# Patient Record
Sex: Female | Born: 1960 | Race: White | Hispanic: No
Health system: Southern US, Academic
[De-identification: ages and names within clinical notes are randomized; demographics above are authoritative.]

## PROBLEM LIST (undated history)

## (undated) ENCOUNTER — Encounter

## (undated) ENCOUNTER — Encounter: Attending: Infectious Disease | Primary: Infectious Disease

## (undated) ENCOUNTER — Encounter: Attending: Hematology & Oncology | Primary: Hematology & Oncology

## (undated) ENCOUNTER — Telehealth

## (undated) ENCOUNTER — Telehealth
Attending: Student in an Organized Health Care Education/Training Program | Primary: Student in an Organized Health Care Education/Training Program

## (undated) ENCOUNTER — Ambulatory Visit

## (undated) ENCOUNTER — Other Ambulatory Visit

## (undated) ENCOUNTER — Encounter
Payer: PRIVATE HEALTH INSURANCE | Attending: Student in an Organized Health Care Education/Training Program | Primary: Student in an Organized Health Care Education/Training Program

## (undated) ENCOUNTER — Encounter: Attending: Pulmonary Disease | Primary: Pulmonary Disease

## (undated) ENCOUNTER — Ambulatory Visit: Payer: Medicare (Managed Care)

## (undated) ENCOUNTER — Encounter
Attending: Student in an Organized Health Care Education/Training Program | Primary: Student in an Organized Health Care Education/Training Program

## (undated) ENCOUNTER — Ambulatory Visit: Payer: PRIVATE HEALTH INSURANCE

## (undated) ENCOUNTER — Telehealth: Attending: Adult Health | Primary: Adult Health

## (undated) ENCOUNTER — Telehealth: Attending: Gynecologic Oncology | Primary: Gynecologic Oncology

## (undated) ENCOUNTER — Telehealth: Attending: Infectious Disease | Primary: Infectious Disease

## (undated) ENCOUNTER — Ambulatory Visit: Attending: Clinical | Primary: Clinical

## (undated) ENCOUNTER — Ambulatory Visit: Attending: Family Medicine | Primary: Family Medicine

## (undated) ENCOUNTER — Ambulatory Visit: Payer: PRIVATE HEALTH INSURANCE | Attending: Hematology & Oncology | Primary: Hematology & Oncology

## (undated) ENCOUNTER — Telehealth: Attending: Nurse Practitioner | Primary: Nurse Practitioner

## (undated) ENCOUNTER — Encounter: Attending: Family Medicine | Primary: Family Medicine

## (undated) ENCOUNTER — Ambulatory Visit: Payer: PRIVATE HEALTH INSURANCE | Attending: Family Medicine | Primary: Family Medicine

## (undated) ENCOUNTER — Ambulatory Visit: Payer: MEDICARE

## (undated) ENCOUNTER — Ambulatory Visit: Payer: Medicare (Managed Care) | Attending: Gynecologic Oncology | Primary: Gynecologic Oncology

## (undated) ENCOUNTER — Encounter: Attending: Gastroenterology | Primary: Gastroenterology

## (undated) ENCOUNTER — Encounter
Payer: PRIVATE HEALTH INSURANCE | Attending: Rehabilitative and Restorative Service Providers" | Primary: Rehabilitative and Restorative Service Providers"

## (undated) ENCOUNTER — Ambulatory Visit: Payer: Medicare (Managed Care) | Attending: Pulmonary Disease | Primary: Pulmonary Disease

## (undated) ENCOUNTER — Ambulatory Visit: Payer: PRIVATE HEALTH INSURANCE | Attending: "Endocrinology | Primary: "Endocrinology

## (undated) ENCOUNTER — Telehealth: Attending: Pharmacist | Primary: Pharmacist

## (undated) ENCOUNTER — Encounter: Payer: PRIVATE HEALTH INSURANCE | Attending: Hematology & Oncology | Primary: Hematology & Oncology

## (undated) ENCOUNTER — Encounter: Attending: Clinical | Primary: Clinical

## (undated) ENCOUNTER — Inpatient Hospital Stay: Payer: Medicare (Managed Care)

## (undated) ENCOUNTER — Ambulatory Visit: Attending: Hematology & Oncology | Primary: Hematology & Oncology

## (undated) ENCOUNTER — Ambulatory Visit
Payer: PRIVATE HEALTH INSURANCE | Attending: Rehabilitative and Restorative Service Providers" | Primary: Rehabilitative and Restorative Service Providers"

## (undated) ENCOUNTER — Encounter: Attending: Family | Primary: Family

## (undated) MED ORDER — URSODIOL 300 MG CAPSULE: 0 days

---
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
## 2004-11-01 ENCOUNTER — Observation Stay: Payer: Self-pay | Admitting: Surgery

## 2004-12-13 ENCOUNTER — Ambulatory Visit: Payer: Self-pay | Admitting: Surgery

## 2009-07-23 ENCOUNTER — Emergency Department: Payer: Self-pay | Admitting: Emergency Medicine

## 2010-06-04 ENCOUNTER — Ambulatory Visit: Payer: Self-pay | Admitting: Internal Medicine

## 2010-06-27 ENCOUNTER — Inpatient Hospital Stay: Payer: Self-pay | Admitting: Internal Medicine

## 2010-07-05 ENCOUNTER — Ambulatory Visit: Payer: Self-pay | Admitting: Internal Medicine

## 2010-07-06 ENCOUNTER — Ambulatory Visit: Payer: Self-pay | Admitting: Internal Medicine

## 2010-07-08 ENCOUNTER — Inpatient Hospital Stay: Payer: Self-pay | Admitting: Internal Medicine

## 2010-08-04 ENCOUNTER — Ambulatory Visit: Payer: Self-pay | Admitting: Internal Medicine

## 2010-09-04 ENCOUNTER — Ambulatory Visit: Payer: Self-pay | Admitting: Internal Medicine

## 2010-10-03 ENCOUNTER — Ambulatory Visit: Payer: Self-pay | Admitting: Internal Medicine

## 2010-10-04 ENCOUNTER — Ambulatory Visit: Payer: Self-pay | Admitting: Internal Medicine

## 2010-11-04 ENCOUNTER — Ambulatory Visit: Payer: Self-pay | Admitting: Internal Medicine

## 2010-12-05 ENCOUNTER — Ambulatory Visit: Payer: Self-pay | Admitting: Internal Medicine

## 2011-01-03 ENCOUNTER — Ambulatory Visit: Payer: Self-pay | Admitting: Internal Medicine

## 2011-01-24 ENCOUNTER — Inpatient Hospital Stay: Payer: Self-pay | Admitting: Internal Medicine

## 2011-02-03 ENCOUNTER — Ambulatory Visit: Payer: Self-pay | Admitting: Internal Medicine

## 2011-03-05 ENCOUNTER — Ambulatory Visit: Payer: Self-pay | Admitting: Internal Medicine

## 2011-04-04 ENCOUNTER — Emergency Department: Payer: Self-pay | Admitting: Emergency Medicine

## 2011-04-05 ENCOUNTER — Ambulatory Visit: Payer: Self-pay | Admitting: Internal Medicine

## 2011-06-05 ENCOUNTER — Ambulatory Visit: Payer: Self-pay | Admitting: Internal Medicine

## 2011-07-06 ENCOUNTER — Ambulatory Visit: Payer: Self-pay | Admitting: Internal Medicine

## 2014-11-15 ENCOUNTER — Emergency Department: Payer: Self-pay | Admitting: Emergency Medicine
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
## 2017-05-22 ENCOUNTER — Ambulatory Visit: Admission: RE | Admit: 2017-05-22 | Discharge: 2017-05-22 | Attending: Pharmacist

## 2017-05-22 DIAGNOSIS — J449 Chronic obstructive pulmonary disease, unspecified: Principal | ICD-10-CM

## 2017-05-22 MED ORDER — TIOTROPIUM BROMIDE 18 MCG CAPSULE WITH INHALATION DEVICE
ORAL_CAPSULE | Freq: Every day | RESPIRATORY_TRACT | 1 refills | 0 days | Status: CP
Start: 2017-05-22 — End: 2017-10-20

## 2017-05-22 MED FILL — PEG-3350/ELECTROLYTES/ELECTROL/SOLR: PEG-3350/ELECTROLYTES/ELECTROL/SOLR | 1 days supply | Qty: 1 | Fill #0

## 2017-05-22 MED FILL — SPIRIVA 18MCG/HANDIHLR/CAP: SPIRIVA 18MCG/HANDIHLR/CAP | 30 days supply | Qty: 1 | Fill #0

## 2017-05-29 ENCOUNTER — Ambulatory Visit
Admission: RE | Admit: 2017-05-29 | Discharge: 2017-05-29 | Disposition: A | Discharge status: Home / Self Care | Attending: Internal Medicine

## 2017-05-29 ENCOUNTER — Ambulatory Visit
Admission: RE | Admit: 2017-05-29 | Discharge: 2017-05-29 | Disposition: A | Discharge status: Home / Self Care | Attending: Certified Registered" | Admitting: Certified Registered"

## 2017-05-29 DIAGNOSIS — K7460 Unspecified cirrhosis of liver: Principal | ICD-10-CM

## 2017-06-20 MED ORDER — URSODIOL 300 MG CAPSULE
ORAL_CAPSULE | Freq: Two times a day (BID) | ORAL | 4 refills | 0 days | Status: CP
Start: 2017-06-20 — End: 2018-09-09

## 2017-06-21 ENCOUNTER — Emergency Department
Admission: EM | Admit: 2017-06-21 | Discharge: 2017-06-21 | Disposition: A | Discharge status: Home / Self Care | Source: Intra-hospital

## 2017-06-21 ENCOUNTER — Emergency Department
Admission: EM | Admit: 2017-06-21 | Discharge: 2017-06-21 | Disposition: A | Discharge status: Home / Self Care | Source: Intra-hospital | Attending: Family | Admitting: Family

## 2017-06-21 DIAGNOSIS — M25562 Pain in left knee: Principal | ICD-10-CM

## 2017-06-21 MED ORDER — ALBUTEROL SULFATE HFA 90 MCG/ACTUATION AEROSOL INHALER: 2 | g | 99 refills | 0 days

## 2017-06-21 MED ORDER — IBUPROFEN 800 MG TABLET
ORAL_TABLET | Freq: Three times a day (TID) | ORAL | 0 refills | 0 days | Status: SS | PRN
Start: 2017-06-21 — End: 2018-09-06

## 2017-06-21 MED ORDER — FLUTICASONE 250 MCG-SALMETEROL 50 MCG/DOSE BLISTR POWDR FOR INHALATION
RESPIRATORY_TRACT | 99 refills | 0 days
Start: 2017-06-21 — End: 2017-06-21

## 2017-06-21 MED ORDER — FUROSEMIDE 40 MG TABLET: 40 mg | tablet | Freq: Every day | 11 refills | 0 days | Status: AC

## 2017-06-21 MED ORDER — PROVENTIL HFA 90 MCG/ACTUATION AEROSOL INHALER
PRN refills | 0 days | Status: CP
Start: 2017-06-21 — End: 2017-06-21

## 2017-06-21 MED ORDER — ALBUTEROL SULFATE HFA 90 MCG/ACTUATION AEROSOL INHALER
RESPIRATORY_TRACT | 99 refills | 0.00000 days
Start: 2017-06-21 — End: 2017-06-21

## 2017-06-21 MED ORDER — SPIRONOLACTONE 100 MG TABLET: 100 mg | tablet | Freq: Every day | 11 refills | 0 days | Status: AC

## 2017-06-21 MED ORDER — FUROSEMIDE 40 MG TABLET
ORAL_TABLET | Freq: Every day | ORAL | 11 refills | 0 days | Status: CP
Start: 2017-06-21 — End: 2017-06-21

## 2017-06-21 MED ORDER — SPIRONOLACTONE 100 MG TABLET
ORAL_TABLET | Freq: Every day | ORAL | 3 refills | 0.00000 days | Status: CP
Start: 2017-06-21 — End: 2018-06-23

## 2017-06-21 MED ORDER — ADVAIR DISKUS 250 MCG-50 MCG/DOSE POWDER FOR INHALATION
PRN refills | 0 days | Status: CP
Start: 2017-06-21 — End: 2017-12-31

## 2017-06-24 MED FILL — SPIRONOLACTONE/100MG/TABS: SPIRONOLACTONE/100MG/TABS | 90 days supply | Qty: 90 | Fill #0

## 2017-06-24 MED FILL — ADVAIR DISKUS/250/50/INH: ADVAIR DISKUS/250/50/INH | 90 days supply | Qty: 3 | Fill #0

## 2017-06-24 MED FILL — PROVENTIL HFA//AERS: PROVENTIL HFA//AERS | 50 days supply | Qty: 2 | Fill #0

## 2017-06-24 MED FILL — URSODIOL/300MG/CAPS: URSODIOL/300MG/CAPS | 90 days supply | Qty: 180 | Fill #0

## 2017-06-24 MED FILL — FUROSEMIDE/40MG/TAB: FUROSEMIDE/40MG/TAB | 90 days supply | Qty: 90 | Fill #0

## 2017-06-24 MED FILL — SPIRIVA 18MCG/HANDIHLR/CAP: SPIRIVA 18MCG/HANDIHLR/CAP | 90 days supply | Qty: 3 | Fill #0

## 2017-06-30 ENCOUNTER — Other Ambulatory Visit: Payer: Self-pay | Admitting: Pediatrics

## 2017-06-30 DIAGNOSIS — J449 Chronic obstructive pulmonary disease, unspecified: Secondary | ICD-10-CM

## 2017-07-04 ENCOUNTER — Emergency Department
Admission: EM | Admit: 2017-07-04 | Discharge: 2017-07-04 | Disposition: A | Discharge status: Home / Self Care | Source: Intra-hospital

## 2017-07-04 MED ORDER — CEPHALEXIN 500 MG CAPSULE
ORAL_CAPSULE | Freq: Four times a day (QID) | ORAL | 0 refills | 0.00000 days | Status: CP
Start: 2017-07-04 — End: 2018-07-04

## 2017-07-04 MED ORDER — CEPHALEXIN 500 MG CAPSULE: 500 mg | capsule | Freq: Four times a day (QID) | 0 refills | 0 days | Status: AC

## 2017-07-04 MED FILL — CEPHALEXIN/500MG/CAPS: CEPHALEXIN/500MG/CAPS | 5 days supply | Qty: 20 | Fill #0

## 2017-07-16 ENCOUNTER — Emergency Department
Admission: EM | Admit: 2017-07-16 | Discharge: 2017-07-16 | Disposition: A | Discharge status: Home / Self Care | Source: Intra-hospital | Attending: Registered Nurse | Admitting: Registered Nurse

## 2017-07-17 ENCOUNTER — Ambulatory Visit: Payer: Disability Insurance | Attending: Pediatrics

## 2017-07-17 DIAGNOSIS — J449 Chronic obstructive pulmonary disease, unspecified: Secondary | ICD-10-CM

## 2017-07-17 MED ORDER — ALBUTEROL SULFATE (2.5 MG/3ML) 0.083% IN NEBU
2.5000 mg | INHALATION_SOLUTION | Freq: Once | RESPIRATORY_TRACT | Status: AC
  Administered 2017-07-17: 2.5 mg via RESPIRATORY_TRACT
  Filled 2017-07-17: qty 3

## 2017-07-23 ENCOUNTER — Ambulatory Visit: Admission: RE | Admit: 2017-07-23 | Discharge: 2017-07-23

## 2017-07-23 DIAGNOSIS — L0292 Furuncle, unspecified: Secondary | ICD-10-CM

## 2017-07-23 DIAGNOSIS — L821 Other seborrheic keratosis: Secondary | ICD-10-CM

## 2017-07-23 DIAGNOSIS — B079 Viral wart, unspecified: Principal | ICD-10-CM

## 2017-07-23 DIAGNOSIS — D485 Neoplasm of uncertain behavior of skin: Secondary | ICD-10-CM

## 2017-07-23 DIAGNOSIS — L720 Epidermal cyst: Secondary | ICD-10-CM

## 2017-07-25 ENCOUNTER — Emergency Department
Admission: EM | Admit: 2017-07-25 | Discharge: 2017-07-26 | Disposition: A | Discharge status: Home / Self Care | Source: Intra-hospital

## 2017-07-26 DIAGNOSIS — R109 Unspecified abdominal pain: Principal | ICD-10-CM

## 2017-08-10 ENCOUNTER — Emergency Department
Admission: EM | Admit: 2017-08-10 | Discharge: 2017-08-10 | Disposition: A | Discharge status: Home / Self Care | Source: Intra-hospital

## 2017-08-10 ENCOUNTER — Emergency Department
Admission: EM | Admit: 2017-08-10 | Discharge: 2017-08-10 | Disposition: A | Discharge status: Home / Self Care | Source: Intra-hospital | Attending: Family | Admitting: Family

## 2017-08-10 DIAGNOSIS — J00 Acute nasopharyngitis [common cold]: Principal | ICD-10-CM

## 2017-08-18 ENCOUNTER — Ambulatory Visit: Admission: RE | Admit: 2017-08-18 | Discharge: 2017-08-18 | Attending: Dermatology | Admitting: Dermatology

## 2017-08-18 DIAGNOSIS — L720 Epidermal cyst: Principal | ICD-10-CM

## 2017-10-20 MED ORDER — TIOTROPIUM BROMIDE 18 MCG CAPSULE WITH INHALATION DEVICE
ORAL_CAPSULE | 1 refills | 0 days
Start: 2017-10-20 — End: 2017-10-20

## 2017-10-20 MED ORDER — SPIRIVA WITH HANDIHALER 18 MCG AND INHALATION CAPSULES
1 refills | 0 days | Status: CP
Start: 2017-10-20 — End: 2018-02-06

## 2017-10-22 MED FILL — FUROSEMIDE/40MG/TAB: FUROSEMIDE/40MG/TAB | 90 days supply | Qty: 90 | Fill #1

## 2017-10-22 MED FILL — SPIRONOLACTONE/100MG/TABS: SPIRONOLACTONE/100MG/TABS | 90 days supply | Qty: 90 | Fill #1

## 2017-10-22 MED FILL — SPIRIVA 18MCG/HANDIHLR/CAP: SPIRIVA 18MCG/HANDIHLR/CAP | 60 days supply | Qty: 2 | Fill #1

## 2017-10-22 MED FILL — PROVENTIL HFA//AERS: PROVENTIL HFA//AERS | 50 days supply | Qty: 2 | Fill #1

## 2017-10-22 MED FILL — URSODIOL/300MG/CAPS: URSODIOL/300MG/CAPS | 90 days supply | Qty: 180 | Fill #1

## 2017-10-22 MED FILL — ADVAIR DISKUS/250/50/INH: ADVAIR DISKUS/250/50/INH | 90 days supply | Qty: 3 | Fill #1

## 2017-11-06 ENCOUNTER — Ambulatory Visit: Admit: 2017-11-06 | Discharge: 2017-11-07

## 2017-11-06 DIAGNOSIS — F329 Major depressive disorder, single episode, unspecified: Secondary | ICD-10-CM

## 2017-11-06 DIAGNOSIS — R7989 Other specified abnormal findings of blood chemistry: Principal | ICD-10-CM

## 2017-11-06 DIAGNOSIS — G2581 Restless legs syndrome: Secondary | ICD-10-CM

## 2017-11-06 DIAGNOSIS — J449 Chronic obstructive pulmonary disease, unspecified: Secondary | ICD-10-CM

## 2017-11-06 MED ORDER — MIRTAZAPINE 15 MG TABLET
ORAL_TABLET | Freq: Every evening | ORAL | 2 refills | 0.00000 days | Status: CP
Start: 2017-11-06 — End: 2017-12-04

## 2017-11-06 MED ORDER — FERROUS SULFATE 325 MG (65 MG IRON) TABLET
ORAL_TABLET | Freq: Every day | ORAL | 3 refills | 0.00000 days | Status: CP
Start: 2017-11-06 — End: 2017-12-04

## 2017-11-06 MED ORDER — FERROUS SULFATE 325 MG (65 MG IRON) TABLET: 325 mg | tablet | Freq: Every day | 3 refills | 0 days | Status: AC

## 2017-11-06 MED ORDER — PREDNISONE 20 MG TABLET
ORAL_TABLET | Freq: Every day | ORAL | 0 refills | 0.00000 days | Status: CP
Start: 2017-11-06 — End: 2018-07-02

## 2017-11-06 MED ORDER — MIRTAZAPINE 15 MG TABLET: 15 mg | tablet | Freq: Every evening | 2 refills | 0 days | Status: AC

## 2017-11-06 MED ORDER — PREDNISONE 20 MG TABLET: 40 mg | tablet | 0 refills | 0 days

## 2017-11-06 MED FILL — FERROUS SULFATE/325MG/TABS: FERROUS SULFATE/325MG/TABS | 30 days supply | Qty: 30 | Fill #0

## 2017-11-06 MED FILL — PREDNISONE/20MG/TABS: PREDNISONE/20MG/TABS | 5 days supply | Qty: 10 | Fill #0

## 2017-11-06 MED FILL — MIRTAZAPINE/15MG/TABS: MIRTAZAPINE/15MG/TABS | 30 days supply | Qty: 30 | Fill #0

## 2017-11-18 ENCOUNTER — Encounter
Admit: 2017-11-18 | Discharge: 2017-11-18 | Disposition: A | Discharge status: Home / Self Care | Payer: PRIVATE HEALTH INSURANCE | Attending: Family

## 2017-11-18 MED ORDER — BENZONATATE 100 MG CAPSULE
ORAL_CAPSULE | Freq: Three times a day (TID) | ORAL | 0 refills | 0.00000 days | Status: CP | PRN
Start: 2017-11-18 — End: 2017-11-18

## 2017-11-18 MED ORDER — CODEINE 10 MG-GUAIFENESIN 100 MG/5 ML ORAL LIQUID
Freq: Every evening | ORAL | 0 refills | 0.00000 days | Status: CP | PRN
Start: 2017-11-18 — End: 2017-11-23

## 2017-11-18 MED ORDER — AZITHROMYCIN 250 MG TABLET
ORAL_TABLET | Freq: Every day | ORAL | 0 refills | 0.00000 days | Status: CP
Start: 2017-11-18 — End: 2017-11-18

## 2017-11-18 MED ORDER — BENZONATATE 100 MG CAPSULE: capsule | 0 refills | 0 days

## 2017-11-18 MED ORDER — AZITHROMYCIN 250 MG TABLET: 250 mg | tablet | Freq: Every day | 0 refills | 0 days | Status: AC

## 2017-11-18 MED FILL — AZITHROMYCIN/250MG/TABS: AZITHROMYCIN/250MG/TABS | 4 days supply | Qty: 4 | Fill #0

## 2017-11-18 MED FILL — BENZONATATE/100MG/CAPS: BENZONATATE/100MG/CAPS | 7 days supply | Qty: 21 | Fill #0

## 2017-11-18 MED FILL — CHERATUSSIN AC//SYP: CHERATUSSIN AC//SYP | 12 days supply | Qty: 120 | Fill #0

## 2017-12-04 ENCOUNTER — Ambulatory Visit: Admit: 2017-12-04 | Discharge: 2017-12-05

## 2017-12-04 DIAGNOSIS — F419 Anxiety disorder, unspecified: Secondary | ICD-10-CM

## 2017-12-04 DIAGNOSIS — D509 Iron deficiency anemia, unspecified: Secondary | ICD-10-CM

## 2017-12-04 DIAGNOSIS — J449 Chronic obstructive pulmonary disease, unspecified: Secondary | ICD-10-CM

## 2017-12-04 DIAGNOSIS — G2581 Restless legs syndrome: Secondary | ICD-10-CM

## 2017-12-04 DIAGNOSIS — F329 Major depressive disorder, single episode, unspecified: Principal | ICD-10-CM

## 2017-12-04 MED ORDER — MIRTAZAPINE 30 MG TABLET: 30 mg | tablet | Freq: Every evening | 5 refills | 0 days | Status: AC

## 2017-12-04 MED ORDER — MIRTAZAPINE 30 MG TABLET
ORAL_TABLET | Freq: Every evening | ORAL | 5 refills | 0.00000 days | Status: CP
Start: 2017-12-04 — End: 2018-04-27

## 2017-12-04 MED ORDER — HYDROXYZINE HCL 25 MG TABLET: 25 mg | tablet | Freq: Three times a day (TID) | 1 refills | 0 days | Status: AC

## 2017-12-04 MED ORDER — HYDROXYZINE HCL 25 MG TABLET
ORAL_TABLET | Freq: Three times a day (TID) | ORAL | 1 refills | 0.00000 days | Status: CP | PRN
Start: 2017-12-04 — End: 2018-02-17

## 2017-12-04 MED ORDER — FERROUS SULFATE 325 MG (65 MG IRON) TABLET: 325 mg | tablet | Freq: Every day | 5 refills | 0 days | Status: AC

## 2017-12-04 MED ORDER — FERROUS SULFATE 325 MG (65 MG IRON) TABLET
ORAL_TABLET | Freq: Every day | ORAL | 5 refills | 0.00000 days | Status: CP
Start: 2017-12-04 — End: 2018-03-31

## 2017-12-04 MED FILL — FERROUS SULFATE/325MG/TABS: FERROUS SULFATE/325MG/TABS | 30 days supply | Qty: 30 | Fill #0

## 2017-12-04 MED FILL — MIRTAZAPINE 30MG/30MG/TABS: MIRTAZAPINE 30MG/30MG/TABS | 30 days supply | Qty: 30 | Fill #0

## 2017-12-04 MED FILL — HYDROXYZINE HCL/25MG/TABS: HYDROXYZINE HCL/25MG/TABS | 20 days supply | Qty: 60 | Fill #0

## 2017-12-31 MED ORDER — FLUTICASONE 250 MCG-SALMETEROL 50 MCG/DOSE BLISTR POWDR FOR INHALATION
Freq: Two times a day (BID) | RESPIRATORY_TRACT | 4 refills | 0.00000 days | Status: CP
Start: 2017-12-31 — End: 2018-02-06

## 2017-12-31 MED ORDER — FLUTICASONE 250 MCG-SALMETEROL 50 MCG/DOSE BLISTR POWDR FOR INHALATION: 1 | each | Freq: Two times a day (BID) | 4 refills | 0 days | Status: AC

## 2018-01-01 MED FILL — HYDROXYZINE HCL/25MG/TABS: HYDROXYZINE HCL/25MG/TABS | 20 days supply | Qty: 60 | Fill #0

## 2018-01-01 MED FILL — MIRTAZAPINE/30MG/TABS: MIRTAZAPINE/30MG/TABS | 30 days supply | Qty: 30 | Fill #0

## 2018-01-13 MED FILL — FEROSUL (PER TABLET)/325MG(65MGELE/TAB: FEROSUL (PER TABLET)/325MG(65MGELE/TAB | 90 days supply | Qty: 90 | Fill #0

## 2018-02-03 MED FILL — MIRTAZAPINE/30MG/TABS: MIRTAZAPINE/30MG/TABS | 30 days supply | Qty: 30 | Fill #1

## 2018-02-05 ENCOUNTER — Encounter: Admit: 2018-02-05 | Discharge: 2018-02-06 | Payer: PRIVATE HEALTH INSURANCE

## 2018-02-05 DIAGNOSIS — N903 Dysplasia of vulva, unspecified: Principal | ICD-10-CM

## 2018-02-06 MED ORDER — FLUTICASONE FUR. 100 MCG-UMECLID 62.5 MCG-VILANT 25 MCG INHALAT.POWDER: 1 | each | Freq: Every day | 11 refills | 0 days | Status: AC

## 2018-02-06 MED ORDER — FLUTICASONE FUR. 100 MCG-UMECLID 62.5 MCG-VILANT 25 MCG INHALAT.POWDER
Freq: Every day | RESPIRATORY_TRACT | 11 refills | 0.00000 days | Status: CP
Start: 2018-02-06 — End: 2018-03-11

## 2018-02-12 ENCOUNTER — Ambulatory Visit: Admit: 2018-02-12 | Discharge: 2018-02-13

## 2018-02-12 DIAGNOSIS — L409 Psoriasis, unspecified: Secondary | ICD-10-CM

## 2018-02-12 DIAGNOSIS — F329 Major depressive disorder, single episode, unspecified: Principal | ICD-10-CM

## 2018-02-12 MED ORDER — ESCITALOPRAM 10 MG TABLET
ORAL_TABLET | Freq: Every day | ORAL | 2 refills | 0.00000 days | Status: CP
Start: 2018-02-12 — End: 2018-03-11

## 2018-02-12 MED ORDER — CLOBETASOL 0.05 % SCALP SOLUTION: mL | Freq: Two times a day (BID) | 0 refills | 0 days | Status: AC

## 2018-02-12 MED ORDER — CLOBETASOL 0.05 % SCALP SOLUTION
Freq: Two times a day (BID) | TOPICAL | 0 refills | 0.00000 days | Status: CP
Start: 2018-02-12 — End: 2018-03-11

## 2018-02-12 MED FILL — CLOBETASOL TOPICAL SOLUTION/0.05%/SOL: CLOBETASOL TOPICAL SOLUTION/0.05%/SOL | 30 days supply | Qty: 2 | Fill #0

## 2018-02-12 MED FILL — ESCITALOPRAM OXALATE/10MG/TABS: ESCITALOPRAM OXALATE/10MG/TABS | 30 days supply | Qty: 30 | Fill #0

## 2018-02-16 MED FILL — FUROSEMIDE/40MG/TAB: FUROSEMIDE/40MG/TAB | 90 days supply | Qty: 90 | Fill #2

## 2018-02-16 MED FILL — SPIRONOLACTONE/100MG/TABS: SPIRONOLACTONE/100MG/TABS | 90 days supply | Qty: 90 | Fill #2

## 2018-02-18 MED ORDER — HYDROXYZINE HCL 25 MG TABLET: 25 mg | tablet | 1 refills | 0 days

## 2018-02-18 MED ORDER — HYDROXYZINE HCL 25 MG TABLET
ORAL_TABLET | ORAL | 1 refills | 0 days | Status: CP
Start: 2018-02-18 — End: 2018-04-27

## 2018-02-19 MED FILL — HYDROXYZINE HCL/25MG/TABS: HYDROXYZINE HCL/25MG/TABS | 30 days supply | Qty: 90 | Fill #0

## 2018-03-02 ENCOUNTER — Encounter: Admit: 2018-03-02 | Discharge: 2018-03-02 | Payer: PRIVATE HEALTH INSURANCE

## 2018-03-02 DIAGNOSIS — Z1239 Encounter for other screening for malignant neoplasm of breast: Principal | ICD-10-CM

## 2018-03-09 MED FILL — MIRTAZAPINE/30MG/TABS: MIRTAZAPINE/30MG/TABS | 30 days supply | Qty: 30 | Fill #2

## 2018-03-09 MED FILL — ALBUTEROL HFA/90MCG/AER: ALBUTEROL HFA/90MCG/AER | 50 days supply | Qty: 2 | Fill #2

## 2018-03-10 ENCOUNTER — Encounter
Admit: 2018-03-10 | Discharge: 2018-03-10 | Disposition: A | Discharge status: Home / Self Care | Payer: PRIVATE HEALTH INSURANCE | Attending: Student in an Organized Health Care Education/Training Program

## 2018-03-10 MED ORDER — CLINDAMYCIN HCL 300 MG CAPSULE: 300 mg | capsule | 0 refills | 0 days

## 2018-03-10 MED ORDER — SACCHAROMYCES BOULARDII 250 MG CAPSULE
ORAL_CAPSULE | Freq: Two times a day (BID) | ORAL | 0 refills | 0.00000 days | Status: CP
Start: 2018-03-10 — End: 2018-03-11

## 2018-03-10 MED ORDER — CLINDAMYCIN HCL 300 MG CAPSULE
ORAL_CAPSULE | Freq: Three times a day (TID) | ORAL | 0 refills | 0.00000 days | Status: CP
Start: 2018-03-10 — End: 2018-03-11

## 2018-03-10 MED FILL — CLINDAMYCIN/300MG/CAP: CLINDAMYCIN/300MG/CAP | 10 days supply | Qty: 30 | Fill #0

## 2018-03-11 ENCOUNTER — Ambulatory Visit
Admit: 2018-03-11 | Discharge: 2018-03-13 | Disposition: A | Discharge status: Home / Self Care | Payer: PRIVATE HEALTH INSURANCE

## 2018-03-11 DIAGNOSIS — D709 Neutropenia, unspecified: Principal | ICD-10-CM

## 2018-03-13 MED ORDER — METHYLPREDNISOLONE 4 MG TABLETS IN A DOSE PACK: Package | 0 refills | 0 days | Status: AC

## 2018-03-13 MED ORDER — SALIVA STIMULANT COMBINATION NO.3 ORAL MUCOSAL SPRAY
ORAL | 0 refills | 0.00000 days | Status: CP | PRN
Start: 2018-03-13 — End: ?

## 2018-03-13 MED ORDER — METHYLPREDNISOLONE 4 MG TABLETS IN A DOSE PACK
ORAL_TABLET | 0 refills | 0 days | Status: CP
Start: 2018-03-13 — End: 2018-09-06

## 2018-03-13 MED FILL — METHYLPRED DOSEPAK/4MG/TABS: METHYLPRED DOSEPAK/4MG/TABS | 6 days supply | Qty: 1 | Fill #0

## 2018-03-31 MED ORDER — FERROUS SULFATE 325 MG (65 MG IRON) TABLET
ORAL_TABLET | 1 refills | 0 days | Status: CP
Start: 2018-03-31 — End: 2018-12-31
  Filled 2018-06-24: qty 90, 90d supply, fill #0

## 2018-03-31 MED FILL — HYDROXYZINE HCL/25MG/TABS: HYDROXYZINE HCL/25MG/TABS | 30 days supply | Qty: 90 | Fill #1

## 2018-04-02 MED FILL — MIRTAZAPINE/30MG/TABS: MIRTAZAPINE/30MG/TABS | 30 days supply | Qty: 30 | Fill #3

## 2018-04-04 MED FILL — FEROSUL (PER TABLET)/325MG(65MGELE/TAB: FEROSUL (PER TABLET)/325MG(65MGELE/TAB | 60 days supply | Qty: 60 | Fill #1

## 2018-04-23 ENCOUNTER — Encounter: Admit: 2018-04-23 | Discharge: 2018-04-24 | Payer: PRIVATE HEALTH INSURANCE

## 2018-04-23 DIAGNOSIS — K743 Primary biliary cirrhosis: Principal | ICD-10-CM

## 2018-04-23 DIAGNOSIS — K7460 Unspecified cirrhosis of liver: Secondary | ICD-10-CM

## 2018-04-27 ENCOUNTER — Encounter: Admit: 2018-04-27 | Discharge: 2018-04-28 | Payer: PRIVATE HEALTH INSURANCE

## 2018-04-27 DIAGNOSIS — F419 Anxiety disorder, unspecified: Secondary | ICD-10-CM

## 2018-04-27 DIAGNOSIS — J449 Chronic obstructive pulmonary disease, unspecified: Secondary | ICD-10-CM

## 2018-04-27 DIAGNOSIS — K743 Primary biliary cirrhosis: Secondary | ICD-10-CM

## 2018-04-27 DIAGNOSIS — L409 Psoriasis, unspecified: Secondary | ICD-10-CM

## 2018-04-27 DIAGNOSIS — K1120 Sialoadenitis, unspecified: Secondary | ICD-10-CM

## 2018-04-27 DIAGNOSIS — K5900 Constipation, unspecified: Secondary | ICD-10-CM

## 2018-04-27 DIAGNOSIS — F329 Major depressive disorder, single episode, unspecified: Secondary | ICD-10-CM

## 2018-04-27 DIAGNOSIS — R7989 Other specified abnormal findings of blood chemistry: Secondary | ICD-10-CM

## 2018-04-27 DIAGNOSIS — G2581 Restless legs syndrome: Principal | ICD-10-CM

## 2018-04-27 MED ORDER — TIOTROPIUM BROMIDE 18 MCG CAPSULE WITH INHALATION DEVICE: capsule | 11 refills | 0 days

## 2018-04-27 MED ORDER — HYDROXYZINE HCL 25 MG TABLET
ORAL_TABLET | Freq: Three times a day (TID) | ORAL | 5 refills | 0.00000 days | Status: CP | PRN
Start: 2018-04-27 — End: 2018-04-27

## 2018-04-27 MED ORDER — MIRTAZAPINE 30 MG TABLET: tablet | 11 refills | 0 days

## 2018-04-27 MED ORDER — FLUTICASONE 500 MCG-SALMETEROL 50 MCG/DOSE BLISTR POWDR FOR INHALATION: 1 | each | Freq: Two times a day (BID) | 11 refills | 0 days | Status: AC

## 2018-04-27 MED ORDER — HYDROXYZINE HCL 25 MG TABLET: 25 mg | tablet | 5 refills | 0 days

## 2018-04-27 MED ORDER — TIOTROPIUM BROMIDE 18 MCG CAPSULE WITH INHALATION DEVICE
ORAL_CAPSULE | Freq: Every day | RESPIRATORY_TRACT | 11 refills | 0.00000 days | Status: CP
Start: 2018-04-27 — End: 2018-04-27

## 2018-04-27 MED ORDER — TIOTROPIUM BROMIDE 18 MCG CAPSULE WITH INHALATION DEVICE: 18 ug | capsule | Freq: Every day | 11 refills | 0 days | Status: AC

## 2018-04-27 MED ORDER — MIRTAZAPINE 30 MG TABLET
Freq: Every evening | ORAL | 11 refills | 0.00000 days | Status: CP
Start: 2018-04-27 — End: 2019-04-27
  Filled 2018-07-13: qty 30, 30d supply, fill #0

## 2018-04-27 MED ORDER — MIRTAZAPINE 30 MG TABLET: 30 mg | each | 11 refills | 0 days

## 2018-04-27 MED ORDER — FLUTICASONE 500 MCG-SALMETEROL 50 MCG/DOSE BLISTR POWDR FOR INHALATION
Freq: Two times a day (BID) | RESPIRATORY_TRACT | 11 refills | 0.00000 days | Status: CP
Start: 2018-04-27 — End: 2018-04-27

## 2018-05-01 ENCOUNTER — Encounter: Admit: 2018-05-01 | Discharge: 2018-05-01 | Payer: PRIVATE HEALTH INSURANCE

## 2018-05-01 DIAGNOSIS — K743 Primary biliary cirrhosis: Principal | ICD-10-CM

## 2018-05-01 DIAGNOSIS — K7460 Unspecified cirrhosis of liver: Secondary | ICD-10-CM

## 2018-05-07 MED FILL — MIRTAZAPINE 30MG/30MG/TABS: MIRTAZAPINE 30MG/30MG/TABS | 30 days supply | Qty: 30 | Fill #4

## 2018-05-07 MED FILL — HYDROXYZINE HCL/25MG/TABS: HYDROXYZINE HCL/25MG/TABS | 30 days supply | Qty: 90 | Fill #0

## 2018-05-12 ENCOUNTER — Ambulatory Visit: Admit: 2018-05-12 | Discharge: 2018-05-13

## 2018-05-12 DIAGNOSIS — L821 Other seborrheic keratosis: Secondary | ICD-10-CM

## 2018-05-12 DIAGNOSIS — L409 Psoriasis, unspecified: Principal | ICD-10-CM

## 2018-05-12 DIAGNOSIS — L820 Inflamed seborrheic keratosis: Secondary | ICD-10-CM

## 2018-05-12 DIAGNOSIS — L219 Seborrheic dermatitis, unspecified: Secondary | ICD-10-CM

## 2018-05-12 MED ORDER — CLOBETASOL 0.05 % SCALP SOLUTION
OPHTHALMIC | 4 refills | 0.00000 days | Status: CP
Start: 2018-05-12 — End: 2018-05-12

## 2018-05-12 MED ORDER — KETOCONAZOLE 2 % SHAMPOO: mL | 11 refills | 0 days | Status: AC

## 2018-05-12 MED ORDER — HYDROCORTISONE 2.5 % TOPICAL CREAM
TOPICAL | 4 refills | 0.00000 days | Status: CP
Start: 2018-05-12 — End: 2019-05-12

## 2018-05-12 MED ORDER — KETOCONAZOLE 2 % SHAMPOO
11 refills | 0 days | Status: CP
Start: 2018-05-12 — End: 2019-05-12
  Filled 2018-06-24: qty 120, 30d supply, fill #0

## 2018-05-12 MED ORDER — CLOBETASOL 0.05 % SCALP SOLUTION: mL | 4 refills | 0 days

## 2018-05-12 MED ORDER — HYDROCORTISONE 2.5 % TOPICAL CREAM: g | 4 refills | 0 days | Status: AC

## 2018-05-17 MED FILL — KETOCONAZOLE SHAMP/2%/SHA: KETOCONAZOLE SHAMP/2%/SHA | 30 days supply | Qty: 1 | Fill #0

## 2018-05-17 MED FILL — FUROSEMIDE/40MG/TAB: FUROSEMIDE/40MG/TAB | 90 days supply | Qty: 90 | Fill #3

## 2018-05-17 MED FILL — HYDROCORT/2.5%/CRE: HYDROCORT/2.5%/CRE | 7 days supply | Qty: 1 | Fill #0

## 2018-05-17 MED FILL — CLOBETASOL TOPICAL SOLUTION/0.05%/SOL: CLOBETASOL TOPICAL SOLUTION/0.05%/SOL | 25 days supply | Qty: 1 | Fill #0

## 2018-05-17 MED FILL — SPIRONOLACTONE/100MG/TABS: SPIRONOLACTONE/100MG/TABS | 90 days supply | Qty: 90 | Fill #3

## 2018-06-03 MED ORDER — HYDROXYZINE HCL 25 MG TABLET
ORAL | 3 refills | 0.00000 days | Status: CP
Start: 2018-06-03 — End: 2018-06-03
  Filled 2018-08-12: qty 90, 30d supply, fill #0

## 2018-06-03 MED ORDER — HYDROXYZINE HCL 25 MG TABLET: each | 3 refills | 0 days | Status: AC

## 2018-06-03 MED FILL — ADVAIR DISKUS/500/50/INH: ADVAIR DISKUS/500/50/INH | 30 days supply | Qty: 1 | Fill #0

## 2018-06-03 MED FILL — SPIRIVA 18MCG/HANDIHLR/CAP: SPIRIVA 18MCG/HANDIHLR/CAP | 30 days supply | Qty: 1 | Fill #0

## 2018-06-03 MED FILL — HYDROXYZINE HCL/25MG/TABS: HYDROXYZINE HCL/25MG/TABS | 30 days supply | Qty: 90 | Fill #1

## 2018-06-03 MED FILL — ALBUTEROL HFA/90MCG/AER: ALBUTEROL HFA/90MCG/AER | 50 days supply | Qty: 2 | Fill #3

## 2018-06-03 MED FILL — URSODIOL/300MG/CAPS: URSODIOL/300MG/CAPS | 90 days supply | Qty: 180 | Fill #2

## 2018-06-03 MED FILL — MIRTAZAPINE 30MG/30MG/TABS: MIRTAZAPINE 30MG/30MG/TABS | 30 days supply | Qty: 30 | Fill #0

## 2018-06-10 DIAGNOSIS — L538 Other specified erythematous conditions: Principal | ICD-10-CM

## 2018-06-11 ENCOUNTER — Ambulatory Visit: Admit: 2018-06-11 | Discharge: 2018-06-11 | Attending: Sports Medicine | Primary: Sports Medicine

## 2018-06-23 MED ORDER — FUROSEMIDE 40 MG TABLET
ORAL_TABLET | ORAL | 3 refills | 0 days | Status: CP
Start: 2018-06-23 — End: 2019-09-07
  Filled 2018-07-28: qty 90, 90d supply, fill #0

## 2018-06-23 MED ORDER — SPIRONOLACTONE 100 MG TABLET
ORAL_TABLET | ORAL | 3 refills | 0 days | Status: CP
Start: 2018-06-23 — End: 2019-09-07
  Filled 2018-07-28: qty 90, 90d supply, fill #0

## 2018-06-24 MED FILL — KETOCONAZOLE 2 % SHAMPOO: 30 days supply | Qty: 120 | Fill #0 | Status: AC

## 2018-06-24 MED FILL — FERROUS SULFATE 325 MG (65 MG IRON) TABLET: 90 days supply | Qty: 90 | Fill #0 | Status: AC

## 2018-07-02 ENCOUNTER — Encounter
Admit: 2018-07-02 | Discharge: 2018-07-02 | Disposition: A | Discharge status: Home / Self Care | Payer: PRIVATE HEALTH INSURANCE | Attending: Family

## 2018-07-02 DIAGNOSIS — J029 Acute pharyngitis, unspecified: Principal | ICD-10-CM

## 2018-07-02 MED ORDER — PREDNISONE 20 MG TABLET
ORAL_TABLET | Freq: Two times a day (BID) | ORAL | 0 refills | 0.00000 days | Status: CP
Start: 2018-07-02 — End: 2018-07-07
  Filled 2018-07-02: qty 10, 5d supply, fill #0

## 2018-07-02 MED ORDER — CARBAMIDE PEROXIDE 6.5 % EAR DROPS
Freq: Two times a day (BID) | OTIC | 0 refills | 0.00000 days | Status: CP | PRN
Start: 2018-07-02 — End: 2018-07-09

## 2018-07-02 MED ORDER — ALBUTEROL SULFATE HFA 90 MCG/ACTUATION AEROSOL INHALER
RESPIRATORY_TRACT | 0 refills | 0 days | Status: CP | PRN
Start: 2018-07-02 — End: 2018-10-14
  Filled 2018-09-10: qty 18, 30d supply, fill #0

## 2018-07-02 MED ORDER — AZITHROMYCIN 250 MG TABLET
ORAL_TABLET | Freq: Every day | ORAL | 0 refills | 0 days | Status: CP
Start: 2018-07-02 — End: 2018-07-07
  Filled 2018-07-02: qty 6, 5d supply, fill #0

## 2018-07-02 MED FILL — PREDNISONE 20 MG TABLET: 5 days supply | Qty: 10 | Fill #0 | Status: AC

## 2018-07-02 MED FILL — AZITHROMYCIN 250 MG TABLET: 5 days supply | Qty: 6 | Fill #0 | Status: AC

## 2018-07-09 ENCOUNTER — Emergency Department
Admit: 2018-07-09 | Discharge: 2018-07-09 | Disposition: A | Discharge status: Home / Self Care | Payer: PRIVATE HEALTH INSURANCE

## 2018-07-09 ENCOUNTER — Encounter
Admit: 2018-07-09 | Discharge: 2018-07-09 | Disposition: A | Discharge status: Home / Self Care | Payer: PRIVATE HEALTH INSURANCE

## 2018-07-09 DIAGNOSIS — R079 Chest pain, unspecified: Principal | ICD-10-CM

## 2018-07-13 MED FILL — MIRTAZAPINE 30 MG TABLET: 30 days supply | Qty: 30 | Fill #0 | Status: AC

## 2018-07-16 ENCOUNTER — Encounter: Admit: 2018-07-16 | Discharge: 2018-07-17 | Payer: PRIVATE HEALTH INSURANCE

## 2018-07-16 DIAGNOSIS — K115 Sialolithiasis: Principal | ICD-10-CM

## 2018-07-28 MED ORDER — SPIRIVA RESPIMAT 2.5 MCG/ACTUATION SOLUTION FOR INHALATION
Freq: Every day | RESPIRATORY_TRACT | 11 refills | 0 days | Status: CP
Start: 2018-07-28 — End: ?
  Filled 2018-09-25: qty 8, 28d supply, fill #0

## 2018-07-28 MED ORDER — (~~LOC~~ PAP ONLY) SPIRIVA RESPIMAT 2.5 MCG/ACTUATION SOLUTION FOR INHALN
Freq: Every day | ORAL | 2 refills | 28.00000 days
Start: 2018-07-28 — End: ?

## 2018-07-28 MED FILL — FUROSEMIDE 40 MG TABLET: 90 days supply | Qty: 90 | Fill #0 | Status: AC

## 2018-07-28 MED FILL — SPIRONOLACTONE 100 MG TABLET: 90 days supply | Qty: 90 | Fill #0 | Status: AC

## 2018-08-12 MED FILL — MIRTAZAPINE 30 MG TABLET: ORAL | 30 days supply | Qty: 30 | Fill #1

## 2018-08-12 MED FILL — MIRTAZAPINE 30 MG TABLET: 30 days supply | Qty: 30 | Fill #1 | Status: AC

## 2018-08-12 MED FILL — HYDROXYZINE HCL 25 MG TABLET: 30 days supply | Qty: 90 | Fill #0 | Status: AC

## 2018-09-04 ENCOUNTER — Encounter: Admit: 2018-09-04 | Discharge: 2018-09-07 | Payer: PRIVATE HEALTH INSURANCE

## 2018-09-04 DIAGNOSIS — R1030 Lower abdominal pain, unspecified: Principal | ICD-10-CM

## 2018-09-06 MED ORDER — ACETAMINOPHEN 325 MG TABLET
ORAL_TABLET | Freq: Four times a day (QID) | ORAL | 0 refills | 0 days | Status: CP | PRN
Start: 2018-09-06 — End: 2019-09-06
  Filled 2018-09-07: qty 30, 8d supply, fill #0

## 2018-09-06 MED ORDER — IBUPROFEN 400 MG TABLET
Freq: Three times a day (TID) | ORAL | 0 days | PRN
Start: 2018-09-06 — End: ?

## 2018-09-06 MED ORDER — VANCOMYCIN 125 MG CAPSULE
ORAL_CAPSULE | Freq: Four times a day (QID) | ORAL | 0 refills | 0.00000 days | Status: CP
Start: 2018-09-06 — End: 2018-09-17
  Filled 2018-09-07: qty 40, 10d supply, fill #0

## 2018-09-07 MED FILL — VANCOMYCIN 125 MG CAPSULE: 10 days supply | Qty: 40 | Fill #0 | Status: AC

## 2018-09-07 MED FILL — ACETAMINOPHEN 325 MG TABLET: 8 days supply | Qty: 30 | Fill #0 | Status: AC

## 2018-09-09 MED ORDER — URSODIOL 300 MG CAPSULE
ORAL_CAPSULE | Freq: Two times a day (BID) | ORAL | 4 refills | 90 days | Status: CP
Start: 2018-09-09 — End: ?
  Filled 2018-09-10: qty 180, 90d supply, fill #0

## 2018-09-10 MED FILL — HYDROXYZINE HCL 25 MG TABLET: 30 days supply | Qty: 90 | Fill #0 | Status: AC

## 2018-09-10 MED FILL — MIRTAZAPINE 30 MG TABLET: ORAL | 30 days supply | Qty: 30 | Fill #2

## 2018-09-10 MED FILL — HYDROXYZINE HCL 25 MG TABLET: ORAL | 30 days supply | Qty: 90 | Fill #0

## 2018-09-10 MED FILL — URSODIOL 300 MG CAPSULE: 90 days supply | Qty: 180 | Fill #0 | Status: AC

## 2018-09-10 MED FILL — ALBUTEROL SULFATE HFA 90 MCG/ACTUATION AEROSOL INHALER: 30 days supply | Qty: 18 | Fill #0 | Status: AC

## 2018-09-10 MED FILL — MIRTAZAPINE 30 MG TABLET: 30 days supply | Qty: 30 | Fill #2 | Status: AC

## 2018-09-17 ENCOUNTER — Encounter: Admit: 2018-09-17 | Discharge: 2018-09-17 | Payer: PRIVATE HEALTH INSURANCE

## 2018-09-17 DIAGNOSIS — G2581 Restless legs syndrome: Secondary | ICD-10-CM

## 2018-09-17 DIAGNOSIS — D696 Thrombocytopenia, unspecified: Secondary | ICD-10-CM

## 2018-09-17 DIAGNOSIS — A498 Other bacterial infections of unspecified site: Secondary | ICD-10-CM

## 2018-09-17 DIAGNOSIS — R188 Other ascites: Secondary | ICD-10-CM

## 2018-09-17 DIAGNOSIS — K745 Biliary cirrhosis, unspecified: Principal | ICD-10-CM

## 2018-09-17 DIAGNOSIS — D708 Other neutropenia: Principal | ICD-10-CM

## 2018-09-17 DIAGNOSIS — F329 Major depressive disorder, single episode, unspecified: Secondary | ICD-10-CM

## 2018-09-17 MED ORDER — GABAPENTIN 300 MG CAPSULE
ORAL_CAPSULE | Freq: Every evening | ORAL | 2 refills | 0.00000 days | Status: CP
Start: 2018-09-17 — End: 2019-05-31
  Filled 2018-09-18: qty 90, 90d supply, fill #0

## 2018-09-18 MED FILL — GABAPENTIN 300 MG CAPSULE: 90 days supply | Qty: 90 | Fill #0 | Status: AC

## 2018-09-25 MED FILL — FERROUS SULFATE 325 MG (65 MG IRON) TABLET: 90 days supply | Qty: 90 | Fill #1 | Status: AC

## 2018-09-25 MED FILL — SPIRIVA RESPIMAT 2.5 MCG/ACTUATION SOLUTION FOR INHALATION: 28 days supply | Qty: 8 | Fill #0 | Status: AC

## 2018-09-25 MED FILL — FERROUS SULFATE 325 MG (65 MG IRON) TABLET: 90 days supply | Qty: 90 | Fill #1

## 2018-10-09 ENCOUNTER — Ambulatory Visit
Admit: 2018-10-09 | Discharge: 2018-10-09 | Disposition: A | Discharge status: Home / Self Care | Payer: PRIVATE HEALTH INSURANCE

## 2018-10-09 MED ORDER — ONDANSETRON HCL 4 MG TABLET
ORAL_TABLET | Freq: Four times a day (QID) | ORAL | 0 refills | 0.00000 days | Status: CP
Start: 2018-10-09 — End: 2018-10-12

## 2018-10-14 MED ORDER — ALBUTEROL SULFATE HFA 90 MCG/ACTUATION AEROSOL INHALER
RESPIRATORY_TRACT | 4 refills | 0.00000 days | Status: CP | PRN
Start: 2018-10-14 — End: 2019-10-14
  Filled 2018-10-15: qty 18, 17d supply, fill #0

## 2018-10-15 MED FILL — HYDROXYZINE HCL 25 MG TABLET: ORAL | 30 days supply | Qty: 90 | Fill #1

## 2018-10-15 MED FILL — MIRTAZAPINE 30 MG TABLET: 30 days supply | Qty: 30 | Fill #3 | Status: AC

## 2018-10-15 MED FILL — MIRTAZAPINE 30 MG TABLET: ORAL | 30 days supply | Qty: 30 | Fill #3

## 2018-10-15 MED FILL — HYDROXYZINE HCL 25 MG TABLET: 30 days supply | Qty: 90 | Fill #1 | Status: AC

## 2018-10-15 MED FILL — ALBUTEROL SULFATE HFA 90 MCG/ACTUATION AEROSOL INHALER: 17 days supply | Qty: 18 | Fill #0 | Status: AC

## 2018-10-20 DIAGNOSIS — M791 Myalgia, unspecified site: Principal | ICD-10-CM

## 2018-10-20 MED ORDER — ONDANSETRON 4 MG DISINTEGRATING TABLET
ORAL_TABLET | Freq: Three times a day (TID) | ORAL | 0 refills | 0.00000 days | Status: CP | PRN
Start: 2018-10-20 — End: 2018-10-27
  Filled 2018-10-21: qty 12, 4d supply, fill #0

## 2018-10-20 MED ORDER — OSELTAMIVIR 75 MG CAPSULE
ORAL_CAPSULE | Freq: Two times a day (BID) | ORAL | 0 refills | 0 days | Status: CP
Start: 2018-10-20 — End: 2018-10-26
  Filled 2018-10-21: qty 10, 5d supply, fill #0

## 2018-10-21 ENCOUNTER — Encounter
Admit: 2018-10-21 | Discharge: 2018-10-21 | Disposition: A | Discharge status: Home / Self Care | Payer: PRIVATE HEALTH INSURANCE | Attending: Family

## 2018-10-21 MED FILL — ONDANSETRON 4 MG DISINTEGRATING TABLET: 4 days supply | Qty: 12 | Fill #0 | Status: AC

## 2018-10-21 MED FILL — OSELTAMIVIR 75 MG CAPSULE: 5 days supply | Qty: 10 | Fill #0 | Status: AC

## 2018-10-23 ENCOUNTER — Ambulatory Visit: Admit: 2018-10-23 | Discharge: 2018-10-24

## 2018-10-23 DIAGNOSIS — J449 Chronic obstructive pulmonary disease, unspecified: Principal | ICD-10-CM

## 2018-10-23 DIAGNOSIS — J111 Influenza due to unidentified influenza virus with other respiratory manifestations: Secondary | ICD-10-CM

## 2018-10-26 MED ORDER — PREDNISONE 20 MG TABLET
ORAL_TABLET | Freq: Every day | ORAL | 0 refills | 0 days | Status: CP
Start: 2018-10-26 — End: 2018-10-31
  Filled 2018-10-26: qty 10, 5d supply, fill #0

## 2018-10-26 MED ORDER — ALBUTEROL SULFATE 2.5 MG/3 ML (0.083 %) SOLUTION FOR NEBULIZATION
RESPIRATORY_TRACT | 2 refills | 0.00000 days | Status: CP | PRN
Start: 2018-10-26 — End: 2018-10-26
  Filled 2018-10-26: qty 90, 5d supply, fill #0

## 2018-10-26 MED ORDER — ALBUTEROL SULFATE 2.5 MG/3 ML (0.083 %) SOLUTION FOR NEBULIZATION: 3 mg | vial | 2 refills | 0 days | Status: AC

## 2018-10-26 MED FILL — PREDNISONE 20 MG TABLET: 5 days supply | Qty: 10 | Fill #0 | Status: AC

## 2018-10-26 MED FILL — ALBUTEROL SULFATE 2.5 MG/3 ML (0.083 %) SOLUTION FOR NEBULIZATION: 5 days supply | Qty: 90 | Fill #0 | Status: AC

## 2018-11-16 ENCOUNTER — Ambulatory Visit: Admit: 2018-11-16 | Discharge: 2018-11-17 | Attending: Family Medicine | Primary: Family Medicine

## 2018-11-16 DIAGNOSIS — J441 Chronic obstructive pulmonary disease with (acute) exacerbation: Principal | ICD-10-CM

## 2018-11-16 MED ORDER — BENZONATATE 100 MG CAPSULE
ORAL_CAPSULE | Freq: Four times a day (QID) | ORAL | 1 refills | 0.00000 days | Status: CP | PRN
Start: 2018-11-16 — End: 2018-12-04
  Filled 2018-11-16: qty 30, 7d supply, fill #0

## 2018-11-16 MED ORDER — AZITHROMYCIN 250 MG TABLET
ORAL_TABLET | 0 refills | 0 days | Status: CP
Start: 2018-11-16 — End: 2018-12-04
  Filled 2018-11-16: qty 6, 5d supply, fill #0

## 2018-11-16 MED FILL — SPIRONOLACTONE 100 MG TABLET: 90 days supply | Qty: 90 | Fill #1 | Status: AC

## 2018-11-16 MED FILL — FUROSEMIDE 40 MG TABLET: 90 days supply | Qty: 90 | Fill #1 | Status: AC

## 2018-11-16 MED FILL — ALBUTEROL SULFATE 2.5 MG/3 ML (0.083 %) SOLUTION FOR NEBULIZATION: RESPIRATORY_TRACT | 5 days supply | Qty: 90 | Fill #0

## 2018-11-16 MED FILL — HYDROXYZINE HCL 25 MG TABLET: ORAL | 30 days supply | Qty: 90 | Fill #2

## 2018-11-16 MED FILL — HYDROXYZINE HCL 25 MG TABLET: 30 days supply | Qty: 90 | Fill #2 | Status: AC

## 2018-11-16 MED FILL — MIRTAZAPINE 30 MG TABLET: 30 days supply | Qty: 30 | Fill #4 | Status: AC

## 2018-11-16 MED FILL — FUROSEMIDE 40 MG TABLET: ORAL | 90 days supply | Qty: 90 | Fill #1

## 2018-11-16 MED FILL — AZITHROMYCIN 250 MG TABLET: 5 days supply | Qty: 6 | Fill #0 | Status: AC

## 2018-11-16 MED FILL — SPIRONOLACTONE 100 MG TABLET: ORAL | 90 days supply | Qty: 90 | Fill #1

## 2018-11-16 MED FILL — MIRTAZAPINE 30 MG TABLET: ORAL | 30 days supply | Qty: 30 | Fill #4

## 2018-11-16 MED FILL — BENZONATATE 100 MG CAPSULE: 7 days supply | Qty: 30 | Fill #0 | Status: AC

## 2018-11-16 MED FILL — ALBUTEROL SULFATE HFA 90 MCG/ACTUATION AEROSOL INHALER: RESPIRATORY_TRACT | 17 days supply | Qty: 18 | Fill #1

## 2018-11-16 MED FILL — ALBUTEROL SULFATE 2.5 MG/3 ML (0.083 %) SOLUTION FOR NEBULIZATION: 5 days supply | Qty: 90 | Fill #0 | Status: AC

## 2018-11-16 MED FILL — ALBUTEROL SULFATE HFA 90 MCG/ACTUATION AEROSOL INHALER: 17 days supply | Qty: 18 | Fill #1 | Status: AC

## 2018-11-26 MED ORDER — HYDROXYZINE HCL 25 MG TABLET
ORAL_TABLET | ORAL | 4 refills | 0 days | Status: CP
Start: 2018-11-26 — End: 2019-11-26
  Filled 2018-12-10: qty 90, 30d supply, fill #0

## 2018-11-30 MED FILL — SPIRIVA RESPIMAT 2.5 MCG/ACTUATION SOLUTION FOR INHALATION: 28 days supply | Qty: 8 | Fill #1 | Status: AC

## 2018-11-30 MED FILL — URSODIOL 300 MG CAPSULE: 90 days supply | Qty: 180 | Fill #1 | Status: AC

## 2018-11-30 MED FILL — SPIRIVA RESPIMAT 2.5 MCG/ACTUATION SOLUTION FOR INHALATION: RESPIRATORY_TRACT | 28 days supply | Qty: 8 | Fill #1

## 2018-11-30 MED FILL — URSODIOL 300 MG CAPSULE: ORAL | 90 days supply | Qty: 180 | Fill #1

## 2018-12-04 ENCOUNTER — Encounter: Admit: 2018-12-04 | Discharge: 2018-12-05 | Payer: PRIVATE HEALTH INSURANCE

## 2018-12-04 DIAGNOSIS — K745 Biliary cirrhosis, unspecified: Secondary | ICD-10-CM

## 2018-12-04 DIAGNOSIS — R252 Cramp and spasm: Principal | ICD-10-CM

## 2018-12-04 DIAGNOSIS — L0292 Furuncle, unspecified: Secondary | ICD-10-CM

## 2018-12-04 MED ORDER — DILTIAZEM 30 MG TABLET
ORAL_TABLET | Freq: Every day | ORAL | 11 refills | 0 days | Status: CP | PRN
Start: 2018-12-04 — End: 2019-01-05
  Filled 2018-12-10: qty 14, 14d supply, fill #0

## 2018-12-10 ENCOUNTER — Encounter
Admit: 2018-12-10 | Discharge: 2018-12-11 | Payer: PRIVATE HEALTH INSURANCE | Attending: Hematology & Oncology | Primary: Hematology & Oncology

## 2018-12-10 DIAGNOSIS — D696 Thrombocytopenia, unspecified: Principal | ICD-10-CM

## 2018-12-10 MED FILL — DILTIAZEM 30 MG TABLET: 14 days supply | Qty: 14 | Fill #0 | Status: AC

## 2018-12-10 MED FILL — GABAPENTIN 300 MG CAPSULE: 7 days supply | Qty: 7 | Fill #0 | Status: AC

## 2018-12-10 MED FILL — GABAPENTIN 300 MG CAPSULE: ORAL | 7 days supply | Qty: 7 | Fill #0

## 2018-12-10 MED FILL — HYDROXYZINE HCL 25 MG TABLET: 30 days supply | Qty: 90 | Fill #0 | Status: AC

## 2018-12-16 MED FILL — GABAPENTIN 300 MG CAPSULE: 90 days supply | Qty: 90 | Fill #0 | Status: AC

## 2018-12-16 MED FILL — GABAPENTIN 300 MG CAPSULE: ORAL | 90 days supply | Qty: 90 | Fill #0

## 2018-12-31 MED ORDER — FERROUS SULFATE 325 MG (65 MG IRON) TABLET
ORAL_TABLET | 4 refills | 0 days | Status: CP
Start: 2018-12-31 — End: ?
  Filled 2019-01-01: qty 90, 90d supply, fill #0

## 2019-01-01 MED FILL — SPIRIVA RESPIMAT 2.5 MCG/ACTUATION SOLUTION FOR INHALATION: 28 days supply | Qty: 8 | Fill #2 | Status: AC

## 2019-01-01 MED FILL — MIRTAZAPINE 30 MG TABLET: ORAL | 30 days supply | Qty: 30 | Fill #5

## 2019-01-01 MED FILL — MIRTAZAPINE 30 MG TABLET: 30 days supply | Qty: 30 | Fill #5 | Status: AC

## 2019-01-01 MED FILL — SPIRIVA RESPIMAT 2.5 MCG/ACTUATION SOLUTION FOR INHALATION: RESPIRATORY_TRACT | 28 days supply | Qty: 8 | Fill #2

## 2019-01-01 MED FILL — FERROUS SULFATE 325 MG (65 MG IRON) TABLET: 90 days supply | Qty: 90 | Fill #0 | Status: AC

## 2019-01-04 MED FILL — HYDROXYZINE HCL 25 MG TABLET: ORAL | 30 days supply | Qty: 90 | Fill #1

## 2019-01-04 MED FILL — HYDROXYZINE HCL 25 MG TABLET: 30 days supply | Qty: 90 | Fill #1 | Status: AC

## 2019-01-22 DIAGNOSIS — R69 Illness, unspecified: Principal | ICD-10-CM

## 2019-01-22 MED ORDER — FLUTICASONE 250 MCG-SALMETEROL 50 MCG/DOSE BLISTR POWDR FOR INHALATION
RESPIRATORY_TRACT | 4 refills | 0 days | Status: CP
Start: 2019-01-22 — End: 2019-01-26

## 2019-01-25 MED FILL — HYDROCORTISONE 2.5 % TOPICAL CREAM: 15 days supply | Qty: 30 | Fill #0

## 2019-01-25 MED FILL — HYDROCORTISONE 2.5 % TOPICAL CREAM: 15 days supply | Qty: 30 | Fill #0 | Status: AC

## 2019-01-26 DIAGNOSIS — N644 Mastodynia: Principal | ICD-10-CM

## 2019-01-26 DIAGNOSIS — J449 Chronic obstructive pulmonary disease, unspecified: Principal | ICD-10-CM

## 2019-01-27 MED ORDER — BUDESONIDE-FORMOTEROL HFA 160 MCG-4.5 MCG/ACTUATION AEROSOL INHALER
Freq: Two times a day (BID) | RESPIRATORY_TRACT | 4 refills | 0.00000 days | Status: CP
Start: 2019-01-27 — End: 2019-01-27
  Filled 2019-02-04: qty 39, 98d supply, fill #0

## 2019-01-27 MED ORDER — DULERA 100 MCG-5 MCG/ACTUATION HFA AEROSOL INHALER: 2 | g | Freq: Two times a day (BID) | 1 refills | 0 days | Status: AC

## 2019-01-27 MED ORDER — DULERA 100 MCG-5 MCG/ACTUATION HFA AEROSOL INHALER
Freq: Two times a day (BID) | RESPIRATORY_TRACT | 0 refills | 0.00000 days | Status: CP
Start: 2019-01-27 — End: 2019-01-27

## 2019-01-31 DIAGNOSIS — J449 Chronic obstructive pulmonary disease, unspecified: Principal | ICD-10-CM

## 2019-02-03 MED ORDER — BENZONATATE 100 MG CAPSULE
ORAL_CAPSULE | Freq: Four times a day (QID) | ORAL | 1 refills | 0 days | Status: CP | PRN
Start: 2019-02-03 — End: 2020-02-03
  Filled 2019-02-04: qty 30, 8d supply, fill #0

## 2019-02-04 MED FILL — DULERA 100 MCG-5 MCG/ACTUATION HFA AEROSOL INHALER: 98 days supply | Qty: 39 | Fill #0 | Status: AC

## 2019-02-04 MED FILL — BENZONATATE 100 MG CAPSULE: 8 days supply | Qty: 30 | Fill #0 | Status: AC

## 2019-03-11 MED FILL — URSODIOL 300 MG CAPSULE: 90 days supply | Qty: 180 | Fill #2 | Status: AC

## 2019-03-11 MED FILL — SPIRONOLACTONE 100 MG TABLET: 90 days supply | Qty: 90 | Fill #2 | Status: AC

## 2019-03-11 MED FILL — HYDROXYZINE HCL 25 MG TABLET: ORAL | 30 days supply | Qty: 90 | Fill #2

## 2019-03-11 MED FILL — (~~LOC~~ PAP ONLY) SPIRIVA RESPIMAT 2.5 MCG/ACTUATION SOLUTION FOR INHALN: ORAL | 28 days supply | Qty: 8 | Fill #0

## 2019-03-11 MED FILL — URSODIOL 300 MG CAPSULE: ORAL | 90 days supply | Qty: 180 | Fill #2

## 2019-03-11 MED FILL — FUROSEMIDE 40 MG TABLET: 90 days supply | Qty: 90 | Fill #2 | Status: AC

## 2019-03-11 MED FILL — GABAPENTIN 300 MG CAPSULE: 83 days supply | Qty: 83 | Fill #1 | Status: AC

## 2019-03-11 MED FILL — SPIRONOLACTONE 100 MG TABLET: ORAL | 90 days supply | Qty: 90 | Fill #2

## 2019-03-11 MED FILL — HYDROXYZINE HCL 25 MG TABLET: 30 days supply | Qty: 90 | Fill #2 | Status: AC

## 2019-03-11 MED FILL — FUROSEMIDE 40 MG TABLET: ORAL | 90 days supply | Qty: 90 | Fill #2

## 2019-03-11 MED FILL — GABAPENTIN 300 MG CAPSULE: ORAL | 83 days supply | Qty: 83 | Fill #1

## 2019-03-11 MED FILL — (~~LOC~~ PAP ONLY) SPIRIVA RESPIMAT 2.5 MCG/ACTUATION SOLUTION FOR INHALN: 28 days supply | Qty: 8 | Fill #0 | Status: AC

## 2019-03-24 ENCOUNTER — Encounter: Admit: 2019-03-24 | Discharge: 2019-03-25 | Payer: MEDICAID

## 2019-04-12 ENCOUNTER — Encounter: Admit: 2019-04-12 | Discharge: 2019-04-12 | Payer: PRIVATE HEALTH INSURANCE

## 2019-04-12 DIAGNOSIS — N644 Mastodynia: Principal | ICD-10-CM

## 2019-04-27 ENCOUNTER — Ambulatory Visit: Admit: 2019-04-27 | Discharge: 2019-04-28 | Payer: PRIVATE HEALTH INSURANCE

## 2019-04-27 DIAGNOSIS — R252 Cramp and spasm: Principal | ICD-10-CM

## 2019-05-03 MED FILL — FERROUS SULFATE 325 MG (65 MG IRON) TABLET: 90 days supply | Qty: 90 | Fill #1

## 2019-05-03 MED FILL — HYDROXYZINE HCL 25 MG TABLET: 30 days supply | Qty: 90 | Fill #3 | Status: AC

## 2019-05-03 MED FILL — (~~LOC~~ PAP ONLY) SPIRIVA RESPIMAT 2.5 MCG/ACTUATION SOLUTION FOR INHALN: 28 days supply | Qty: 8 | Fill #1 | Status: AC

## 2019-05-03 MED FILL — HYDROXYZINE HCL 25 MG TABLET: ORAL | 30 days supply | Qty: 90 | Fill #3

## 2019-05-03 MED FILL — FERROUS SULFATE 325 MG (65 MG IRON) TABLET: 90 days supply | Qty: 90 | Fill #1 | Status: AC

## 2019-05-03 MED FILL — (~~LOC~~ PAP ONLY) SPIRIVA RESPIMAT 2.5 MCG/ACTUATION SOLUTION FOR INHALN: ORAL | 28 days supply | Qty: 8 | Fill #1

## 2019-05-13 ENCOUNTER — Encounter
Admit: 2019-05-13 | Discharge: 2019-05-14 | Payer: PRIVATE HEALTH INSURANCE | Attending: Sports Medicine | Primary: Sports Medicine

## 2019-05-13 DIAGNOSIS — J988 Other specified respiratory disorders: Secondary | ICD-10-CM

## 2019-05-13 DIAGNOSIS — U071 COVID-19: Principal | ICD-10-CM

## 2019-05-14 ENCOUNTER — Encounter: Admit: 2019-05-14 | Discharge: 2019-05-15 | Payer: PRIVATE HEALTH INSURANCE

## 2019-05-14 DIAGNOSIS — Z20828 Contact with and (suspected) exposure to other viral communicable diseases: Principal | ICD-10-CM

## 2019-05-14 DIAGNOSIS — R3989 Other symptoms and signs involving the genitourinary system: Secondary | ICD-10-CM

## 2019-05-14 DIAGNOSIS — N390 Urinary tract infection, site not specified: Secondary | ICD-10-CM

## 2019-05-14 MED ORDER — CIPROFLOXACIN 500 MG TABLET
ORAL_TABLET | Freq: Two times a day (BID) | ORAL | 0 refills | 0 days | Status: CP
Start: 2019-05-14 — End: 2019-05-21

## 2019-05-31 MED ORDER — FLUTICASONE 250 MCG-SALMETEROL 50 MCG/DOSE BLISTR POWDR FOR INHALATION
RESPIRATORY_TRACT | 4 refills | 0 days | Status: CP
Start: 2019-05-31 — End: 2020-05-30

## 2019-05-31 MED ORDER — GABAPENTIN 300 MG CAPSULE
ORAL_CAPSULE | Freq: Every evening | ORAL | 0 refills | 90.00000 days | Status: CP
Start: 2019-05-31 — End: 2020-05-30
  Filled 2019-06-02: qty 90, 90d supply, fill #0

## 2019-06-02 MED FILL — SPIRONOLACTONE 100 MG TABLET: 90 days supply | Qty: 90 | Fill #3 | Status: AC

## 2019-06-02 MED FILL — URSODIOL 300 MG CAPSULE: ORAL | 30 days supply | Qty: 60 | Fill #3

## 2019-06-02 MED FILL — SPIRONOLACTONE 100 MG TABLET: ORAL | 90 days supply | Qty: 90 | Fill #3

## 2019-06-02 MED FILL — GABAPENTIN 300 MG CAPSULE: 90 days supply | Qty: 90 | Fill #0 | Status: AC

## 2019-06-02 MED FILL — FUROSEMIDE 40 MG TABLET: ORAL | 90 days supply | Qty: 90 | Fill #3

## 2019-06-02 MED FILL — (~~LOC~~ PAP ONLY) SPIRIVA RESPIMAT 2.5 MCG/ACTUATION SOLUTION FOR INHALN: ORAL | 28 days supply | Qty: 8 | Fill #2

## 2019-06-02 MED FILL — FUROSEMIDE 40 MG TABLET: 90 days supply | Qty: 90 | Fill #3 | Status: AC

## 2019-06-02 MED FILL — (~~LOC~~ PAP ONLY) SPIRIVA RESPIMAT 2.5 MCG/ACTUATION SOLUTION FOR INHALN: 28 days supply | Qty: 8 | Fill #2 | Status: AC

## 2019-06-02 MED FILL — URSODIOL 300 MG CAPSULE: 30 days supply | Qty: 60 | Fill #3 | Status: AC

## 2019-06-24 ENCOUNTER — Encounter: Admit: 2019-06-24 | Discharge: 2019-06-25 | Payer: PRIVATE HEALTH INSURANCE

## 2019-06-24 DIAGNOSIS — N903 Dysplasia of vulva, unspecified: Principal | ICD-10-CM

## 2019-06-24 DIAGNOSIS — L03314 Cellulitis of groin: Secondary | ICD-10-CM

## 2019-06-24 DIAGNOSIS — N9089 Other specified noninflammatory disorders of vulva and perineum: Secondary | ICD-10-CM

## 2019-06-24 MED ORDER — CLINDAMYCIN HCL 150 MG CAPSULE
ORAL_CAPSULE | Freq: Four times a day (QID) | ORAL | 0 refills | 10 days | Status: CP
Start: 2019-06-24 — End: 2019-07-01
  Filled 2019-06-24: qty 40, 10d supply, fill #0

## 2019-06-24 MED FILL — CLINDAMYCIN HCL 150 MG CAPSULE: 10 days supply | Qty: 40 | Fill #0 | Status: AC

## 2019-07-01 ENCOUNTER — Encounter: Admit: 2019-07-01 | Discharge: 2019-07-02 | Payer: PRIVATE HEALTH INSURANCE

## 2019-07-01 DIAGNOSIS — N903 Dysplasia of vulva, unspecified: Principal | ICD-10-CM

## 2019-07-01 DIAGNOSIS — L03314 Cellulitis of groin: Secondary | ICD-10-CM

## 2019-07-01 MED ORDER — CLINDAMYCIN HCL 150 MG CAPSULE
ORAL_CAPSULE | Freq: Four times a day (QID) | ORAL | 0 refills | 4.00000 days | Status: CP
Start: 2019-07-01 — End: 2019-07-05
  Filled 2019-07-01: qty 16, 4d supply, fill #0

## 2019-07-01 MED FILL — HYDROXYZINE HCL 25 MG TABLET: 30 days supply | Qty: 90 | Fill #4 | Status: AC

## 2019-07-01 MED FILL — HYDROXYZINE HCL 25 MG TABLET: ORAL | 30 days supply | Qty: 90 | Fill #4

## 2019-07-01 MED FILL — CLINDAMYCIN HCL 150 MG CAPSULE: 4 days supply | Qty: 16 | Fill #0 | Status: AC

## 2019-08-20 ENCOUNTER — Ambulatory Visit
Admit: 2019-08-20 | Discharge: 2019-08-21 | Payer: PRIVATE HEALTH INSURANCE | Attending: Family Medicine | Primary: Family Medicine

## 2019-08-20 DIAGNOSIS — R188 Other ascites: Principal | ICD-10-CM

## 2019-08-20 DIAGNOSIS — R238 Other skin changes: Principal | ICD-10-CM

## 2019-08-20 DIAGNOSIS — J449 Chronic obstructive pulmonary disease, unspecified: Principal | ICD-10-CM

## 2019-08-20 DIAGNOSIS — L723 Sebaceous cyst: Principal | ICD-10-CM

## 2019-08-20 DIAGNOSIS — F419 Anxiety disorder, unspecified: Principal | ICD-10-CM

## 2019-08-20 DIAGNOSIS — Z2821 Immunization not carried out because of patient refusal: Principal | ICD-10-CM

## 2019-08-20 DIAGNOSIS — K8309 Other cholangitis: Principal | ICD-10-CM

## 2019-08-20 MED ORDER — SPIRONOLACTONE 100 MG TABLET: 100 mg | tablet | 3 refills | 0 days | Status: AC

## 2019-08-20 MED ORDER — GABAPENTIN 300 MG CAPSULE: 300 mg | capsule | Freq: Every evening | 0 refills | 90 days | Status: AC

## 2019-08-20 MED ORDER — ALBUTEROL SULFATE 2.5 MG/3 ML (0.083 %) SOLUTION FOR NEBULIZATION: 3 mg | mL | 2 refills | 5 days | Status: AC

## 2019-08-20 MED ORDER — DOXYCYCLINE HYCLATE 100 MG CAPSULE: 100 mg | capsule | Freq: Two times a day (BID) | 0 refills | 7 days | Status: AC

## 2019-08-20 MED ORDER — URSODIOL 300 MG CAPSULE: 300 mg | capsule | Freq: Two times a day (BID) | 4 refills | 90 days | Status: AC

## 2019-08-20 MED ORDER — SPIRIVA RESPIMAT 2.5 MCG/ACTUATION SOLUTION FOR INHALATION: 5 ug | g | Freq: Every day | 11 refills | 14 days | Status: AC

## 2019-08-20 MED ORDER — ALBUTEROL SULFATE HFA 90 MCG/ACTUATION AEROSOL INHALER: 2 | g | 4 refills | 17 days | Status: AC

## 2019-08-20 MED ORDER — FUROSEMIDE 40 MG TABLET: 40 mg | tablet | 3 refills | 0 days | Status: AC

## 2019-08-20 MED ORDER — HYDROXYZINE HCL 25 MG TABLET: 25 mg | tablet | Freq: Four times a day (QID) | 4 refills | 23 days | Status: AC

## 2019-09-16 ENCOUNTER — Encounter: Admit: 2019-09-16 | Discharge: 2019-09-17 | Payer: PRIVATE HEALTH INSURANCE

## 2019-09-16 DIAGNOSIS — M79605 Pain in left leg: Secondary | ICD-10-CM

## 2019-09-16 DIAGNOSIS — K743 Primary biliary cirrhosis: Principal | ICD-10-CM

## 2019-09-16 DIAGNOSIS — D708 Other neutropenia: Principal | ICD-10-CM

## 2019-09-16 DIAGNOSIS — M79604 Pain in right leg: Principal | ICD-10-CM

## 2019-09-16 DIAGNOSIS — G2581 Restless legs syndrome: Principal | ICD-10-CM

## 2019-09-16 DIAGNOSIS — D696 Thrombocytopenia, unspecified: Principal | ICD-10-CM

## 2019-09-16 DIAGNOSIS — K7460 Unspecified cirrhosis of liver: Principal | ICD-10-CM

## 2019-09-16 MED ORDER — GABAPENTIN 300 MG CAPSULE
ORAL_CAPSULE | Freq: Every evening | ORAL | 4 refills | 90.00000 days | Status: CP
Start: 2019-09-16 — End: 2020-09-15

## 2019-09-22 DIAGNOSIS — J449 Chronic obstructive pulmonary disease, unspecified: Principal | ICD-10-CM

## 2019-10-07 ENCOUNTER — Encounter: Admit: 2019-10-07 | Discharge: 2019-10-08 | Payer: PRIVATE HEALTH INSURANCE

## 2019-10-13 DIAGNOSIS — M810 Age-related osteoporosis without current pathological fracture: Principal | ICD-10-CM

## 2019-10-13 DIAGNOSIS — J449 Chronic obstructive pulmonary disease, unspecified: Principal | ICD-10-CM

## 2019-10-18 ENCOUNTER — Encounter
Admit: 2019-10-18 | Discharge: 2019-10-19 | Payer: PRIVATE HEALTH INSURANCE | Attending: Internal Medicine | Primary: Internal Medicine

## 2019-10-18 MED ORDER — RIFAXIMIN 550 MG TABLET
ORAL_TABLET | Freq: Two times a day (BID) | ORAL | 3 refills | 90 days | Status: CP
Start: 2019-10-18 — End: ?
  Filled 2019-10-21: qty 60, 30d supply, fill #0

## 2019-10-19 DIAGNOSIS — K7290 Hepatic failure, unspecified without coma: Principal | ICD-10-CM

## 2019-10-20 LAB — VITAMIN D, TOTAL (25OH): Lab: 35.8

## 2019-10-20 NOTE — Unmapped (Signed)
[facility] Population Health   Gap Closure Progress Note             Date of Service:  10/20/19     Mode of contact: Phone  Outreach outcome: Spoke to pt    Health Maintenance Due:   Health Maintenance Due   Topic Date Due   ??? Zoster Vaccines (1 of 2) 10/24/2013   ??? Influenza Vaccine (1) 07/06/2019       Addressed the following gap(s) with Louann Liv Vegh:  COPD MMRC symptom assessment      With the following outcome(s): Other: Completed MMRC assessment.     COPD/ Asthma:  ? Recent exacerbation requiring hospitalization?: no  ? Currently experiencing symptoms?: N/A  ? Symptoms feeling: the same as usual  ? Rescue inhaler (albuterol) use is: >2 days per week  ? Using maintenance inhaler(s) as prescribed?: yes , daily  ? Limitations of daily activity: some  ? Been counseled on proper inhaler technique?: yes  o Need/ want visit to learn proper inhaler technique?: no  ? mMRC screening completed?: yes    Educational points mentioned:  ? Encouraged smoking cessation  ? Use maintenance inhaler as prescribed (this inhaler should be used daily, regardless of symptoms at the time; this is different from the rescue inhaler that should be used only when experiencing symptoms)  ? Avoid triggers (such as pollen, dust, animal dander, etc.)  ? Reviewed inhaler technique soft mist inhaler (Respimat)    Additional Information/Plan:   Declined flu vaccine due to adverse effects from a flu shot previously.    Louann Liv Edgecombe was provided my direct contact information and was encouraged to contact me should additional needs arise.    Minutes spent providing outreach: 20    Jianni Batten A Bryona Foxworthy  Population Health  [facility]

## 2019-10-20 NOTE — Unmapped (Signed)
[facility] Specialty Medication Onboarding    Specialty Medication: XIFAXAN 550MG  TABLETS  Prior Authorization: Approved   Financial Assistance: No - copay  <$25  Final Copay/Day Supply: $3 / 30 DAYS    Insurance Restrictions: None     Notes to Pharmacist:     The triage team has completed the benefits investigation and has determined that the patient is able to fill this medication at [facility]. Please contact the patient to complete the onboarding or follow up with the prescribing physician as needed.

## 2019-10-20 NOTE — Unmapped (Signed)
[facility]   Patient Onboarding/Medication Counseling    Mary Owens is a 58 y.o. female with cirrhosis who I am counseling today on initiation of therapy.  I am speaking to the patient.    Was a Nurse, learning disability used for this call? No    Verified patient's date of birth / HIPAA.    Specialty medication(s) to be sent: Infectious Disease: Xifaxan      Non-specialty medications/supplies to be sent: n/a      Medications not needed at this time: n/a         Xifaxan (rifaximin) 550mg  tablets    Medication & Administration     Dosage: Hepatic Encephalopathy Prevention: Take one 550mg  tablet by mouth twice daily.    Administration: Take with or without food.    Adherence/Missed dose instructions:   ? Take missed dose as soon as you remember. If it is close to the time of your next dose, skip the missed dose and resume with your next scheduled dose.  ? Do not take extra doses or 2 doses at the same time.    Goals of Therapy     Hepatic Encephalopathy: The goal is to reduce risk of overt hepatic encephalopathy recurrence.    Side Effects & Monitoring Parameters     Common Side Effects:   ? Peripheral edema  ? Nausea  ? Dizziness  ? Fatigue  ? Ascites    The following side effects should be reported to the provider:  ?? Signs of an allergic reaction, such as rash; hives; itching; red, swollen, blistered, or peeling skin with or without fever. If you have wheezing; tightness in the chest or throat; trouble breathing, swallowing, or talking; unusual hoarseness; or swelling of the mouth, face, lips, tongue, or throat, call 911 or go to the closest emergency department (ED).   ?? Swelling in the arms, legs or stomach.   ?? Feeling very tired or weak.  ?? Low mood (depression).   ?? Fever. ?? Diarrhea is common with antibiotics. Rarely, a severe form called C diff???associated diarrhea (CDAD) may happen. Sometimes this has led to a deadly bowel problem (colitis). CDAD may happen during or a few months after taking antibiotics. Call your doctor right away if you have stomach pain, cramps, or very loose, watery, or bloody stools. Check with your doctor before treating diarrhea.    Monitoring Parameters:   ? For the prevention of hepatic encephalopathy:  Patient should monitor for changes in mental status.       Contraindications, Warnings, & Precautions     ? Superinfection: Prolonged use may result in fungal or bacterial superinfection, including Clostridioides (formerly Clostridium) difficile-associated diarrhea (CDAD) and pseudomembranous colitis; CDAD has been observed >2 months post-antibiotic treatment.  ? Severe (Child Pugh Class C) Hepatic Impairment: increased systemic exposure with severe hepatic impairment.  ? Concomitant use with P-glycoprotein (P-gp) inhibitors: P-gp inhibitors may increase systemic exposure of rifaximin.    Drug/Food Interactions     ? Medication list reviewed in Epic. The patient was instructed to inform the care team before taking any new medications or supplements. No drug interactions identified.   ? Warfarin: monitor INR and prothrombin time; Dose adjustment of warfarin may be needed to maintain target INR range.    Storage, Handling Precautions, & Disposal     ? Store this medication at room temperature.  ? Store in a dry place. Do not store in a bathroom.   ? Keep all drugs out of the  reach of children and pets.  ? Throw away unused or expired drugs. Do not flush down a toilet or pour down a drain unless you are told to do so. Check with your pharmacist if you have questions about the best way to throw out drugs. There may be drug take-back programs in your area.        Current Medications (including OTC/herbals), Comorbidities and Allergies Current Outpatient Medications   Medication Sig Dispense Refill   ??? albuterol 2.5 mg /3 mL (0.083 %) nebulizer solution Inhale 3 mL (2.5 mg total) by nebulization every four (4) hours as needed for wheezing. 90 mL 2   ??? albuterol HFA 90 mcg/actuation inhaler Inhale 2 puffs by mouth every four (4) hours as needed for wheezing. 18 g 4   ??? benzonatate (TESSALON PERLES) 100 MG capsule Take 1 capsule (100 mg total) by mouth every six (6) hours as needed for cough. (Patient not taking: Reported on 10/18/2019) 30 capsule 1   ??? doxycycline (VIBRAMYCIN) 100 MG capsule Take 1 capsule (100 mg total) by mouth Two (2) times a day. (Patient not taking: Reported on 10/18/2019) 14 capsule 0   ??? ferrous sulfate 325 (65 FE) MG tablet TAKE 1 TABLET BY MOUTH ONCE DAILY (Patient not taking: Reported on 10/18/2019) 90 tablet 4   ??? fluticasone propion-salmeteroL (ADVAIR) 250-50 mcg/dose diskus INHALE 1 PUFF BY MOUTH TWICE DAILY 180 each 4   ??? furosemide (LASIX) 40 MG tablet TAKE 1 TABLET BY MOUTH ONCE DAILY 90 tablet 3   ??? gabapentin (NEURONTIN) 300 MG capsule Take 2 capsules (600 mg total) by mouth nightly. 180 capsule 4   ??? hydrOXYzine (ATARAX) 25 MG tablet Take 1 tablet (25 mg total) by mouth every six (6) hours as needed for anxiety. 90 tablet 4   ??? ibuprofen (ADVIL,MOTRIN) 400 MG tablet Take 1 tablet (400 mg total) by mouth every eight (8) hours as needed for pain. (Patient not taking: Reported on 10/18/2019)     ??? XIFAXAN 550 mg Tab Take 1 tablet (550 mg total) by mouth Two (2) times a day. 180 tablet 3   ??? saliva stimulant comb. no.3 (BIOTINE) Spry Take 1 spray by mouth every two (2) hours as needed (dry mouth). (Patient not taking: Reported on 12/10/2018) 1 Bottle 0   ??? spironolactone (ALDACTONE) 100 MG tablet TAKE 1 TABLET BY MOUTH ONCE DAILY 90 tablet 3   ??? tiotropium bromide (SPIRIVA RESPIMAT) 2.5 mcg/actuation inhalation mist Inhale 2 puffs by mouth once daily. 4 g 11 ??? ursodioL (ACTIGALL) 300 mg capsule Take 1 capsule (300 mg total) by mouth Two (2) times a day. 180 capsule 4     No current facility-administered medications for this visit.        Allergies   Allergen Reactions   ??? Penicillins Rash     Other reaction(s): RASH  Other reaction(s): RASH   ??? Sulfa (Sulfonamide Antibiotics) Rash     Other reaction(s): RASH  Other reaction(s): RASH       Patient Active Problem List   Diagnosis   ??? Carcinoma in situ of vulva   ??? Hemorrhoids   ??? Constipation   ??? Lower abdominal pain   ??? Well woman exam (no gynecological exam)   ??? Hx vulvar dysplasia   ??? Neutropenia (CMS-HCC)   ??? Abdominal pain   ??? Ascites   ??? Depression   ??? Thrombocytopenia (CMS-HCC)   ??? Parotitis   ??? COPD (chronic obstructive pulmonary disease) (CMS-HCC)   ???  Macular erythematous rash   ??? Clostridium difficile infection   ??? Cirrhosis of liver (CMS-HCC)   ??? Primary biliary cirrhosis (CMS-HCC)   ??? COPD (chronic obstructive pulmonary disease) (CMS-HCC)   ??? COVID-19   ??? Restless leg       Reviewed and up to date in Epic.    Appropriateness of Therapy     Is medication and dose appropriate based on diagnosis? Yes    Prescription has been clinically reviewed: Yes    Baseline Quality of Life Assessment      How many days over the past month did your cirrhosis keep you from your normal activities? 0    Financial Information     Medication Assistance provided: Prior Authorization    Anticipated copay of $3.00 reviewed with patient. Verified delivery address.    Delivery Information     Scheduled delivery date: 10/21/2019    Expected start date: 10/21/2019    Medication will be delivered via Same Day Courier to the prescription address in [facility].  This shipment will not require a signature. Explained the services we provide at [facility] and that each month we would call to set up refills.  Stressed importance of returning phone calls so that we could ensure they receive their medications in time each month.  Informed patient that we should be setting up refills 7-10 days prior to when they will run out of medication.  A pharmacist will reach out to perform a clinical assessment periodically.  Informed patient that a welcome packet and a drug information handout will be sent.      Patient verbalized understanding of the above information as well as how to contact the pharmacy at [phone] option 4 with any questions/concerns.  The pharmacy is open Monday through Friday 8:30am-4:30pm.  A pharmacist is available 24/7 via pager to answer any clinical questions they may have.    Patient Specific Needs     ? Does the patient have any physical, cognitive, or cultural barriers? No    ? Patient prefers to have medications discussed with  Patient     ? Is the patient or caregiver able to read and understand education materials at a high school level or above? Yes    ? Patient's primary language is  English     ? Is the patient high risk? No     ? Does the patient require a Care Management Plan? No     ? Does the patient require physician intervention or other additional services (i.e. nutrition, smoking cessation, social work)? No      Roderic Palau  [facility] Specialty Pharmacist

## 2019-10-21 ENCOUNTER — Encounter: Admit: 2019-10-21 | Discharge: 2019-10-22 | Payer: PRIVATE HEALTH INSURANCE

## 2019-10-21 MED FILL — XIFAXAN 550 MG TABLET: 30 days supply | Qty: 60 | Fill #0 | Status: AC

## 2019-10-21 NOTE — Unmapped (Signed)
Phone Visit Note  This medical encounter was conducted virtually using [email or web address]  [email or web address] protocols.      I have identified myself to the patient and conveyed my credentials to Mary Owens  Patient/proxy has provided verbal consent to proceed with this phone visit.  In case we get disconnected, patient's phone number is [phone] (home)   In case of Emergency, patient's current location: Home: [id number]  [place] [address]  Is there someone else in the room? No.       [name] is a 58 y.o. female requesting a telephone visit today regarding the following issues:    Assessment/Plan:      Problem List Items Addressed This Visit        Digestive    Cirrhosis of liver (CMS-HCC)    Relevant Orders    Ambulatory referral to Gastroenterology    Primary biliary cirrhosis (CMS-HCC)     Followed by GI. New concern for possible hepatic encephalopathy. Encouraged her to trial the new medication. She is interested in a second opinon at [facility]. I have put in a referral. She would like to transfer potentially.          Relevant Orders    Ambulatory referral to Gastroenterology       Musculoskeletal and Integument    Osteoporosis without current pathological fracture     New diagnosis, likely related to her liver disease. Her baseline labs do not show vit d deficiency. We will treat with bisphosphonate. Discussed how to take including sitting up for at least 30 mintues. On review of last EGD does not appear she has any abnormalities that would be a contraindication to treat this way. If she does not tolerate would refer to endocrine to discuss other treatment options. Will plan to repeat DEXA in 2 years.            Other Visit Diagnoses     Daytime somnolence    -  Primary    I am concerned she could have sleep apnea. will order sleep study.     Relevant Orders    Polysomnography (Standard)          Followup:  No follow-ups on file. I spent 30 minutes on the phone with the patient. I spent an additional 5 minutes on pre- and post-visit activities.     The patient was physically located in [place] or a state in which I am permitted to provide care. The patient and/or parent/guardian understood that s/he may incur co-pays and cost sharing, and agreed to the telemedicine visit. The visit was reasonable and appropriate under the circumstances given the patient's presentation at the time.    The patient and/or parent/guardian has been advised of the potential risks and limitations of this mode of treatment (including, but not limited to, the absence of in-person examination) and has agreed to be treated using telemedicine. The patient's/patient's family's questions regarding telemedicine have been answered.     If the visit was completed in an ambulatory setting, the patient and/or parent/guardian has also been advised to contact their provider???s office for worsening conditions, and seek emergency medical treatment and/or call 911 if the patient deems either necessary.         Coding: Medicare/Medicaid/All Commercial insurers: use traditional E/M codes for new or established patients. Document total time spent including pre and post.        Subjective:     HPI  Mary Owens is a 58 y.o. female requesting a  telephone visit to discuss the following issues:    Waking up with headache. Happening for several months. When blows nose there is blood clots. Having cramps in her legs. Feels rested in the day but then still feels sleepy.   Had asked to see dr. Ruffin Frederick but still saw APP. They have started a new medication she is not sure she should take it.  She is interested in getting a second opinion at [facility]  On chart review x-ray without any bony abnormalities.  Her DEXA is consistent with osteoporosis.  She has since gotten lab work for this.    ROS  As per HPI.      HISTORY: I have reviewed the patient's problem list, current medications, and allergies and have updated/reconciled them as needed.      Objective:     As part of this Telephone Visit, no in-person exam was conducted.

## 2019-10-21 NOTE — Unmapped (Signed)
Telehealth Pre -Visit Note      10/21/19   PCP: Orland Mustard, MD  Encounter Provider: Orland Mustard, MD     Mary Owens is a 58 y.o. female    Chief Complaint   Patient presents with   ??? Follow-up     leg pain       COVID-19 Travel Screening:                    Home Vitals  N/A or Unable to obtain    Allergies   Allergen Reactions   ??? Penicillins Rash     Other reaction(s): RASH  Other reaction(s): RASH   ??? Sulfa (Sulfonamide Antibiotics) Rash     Other reaction(s): RASH  Other reaction(s): RASH        Health Maintenance Due   Topic Date Due   ??? Zoster Vaccines (1 of 2) 10/24/2013   ??? Influenza Vaccine (1) 07/06/2019   ??? Lipid Screening  01/19/2020       Healthcare Decision Maker:   HCDM (patient stated preference): Corbett,Robert E - Domestic Partner - [phone]    HCDM, First Alternate: Lanette Hampshire - Daughter - [phone]    Current Outpatient Medications on File Prior to Visit   Medication Sig Dispense Refill   ??? albuterol 2.5 mg /3 mL (0.083 %) nebulizer solution Inhale 3 mL (2.5 mg total) by nebulization every four (4) hours as needed for wheezing. 90 mL 2   ??? albuterol HFA 90 mcg/actuation inhaler Inhale 2 puffs by mouth every four (4) hours as needed for wheezing. 18 g 4   ??? fluticasone propion-salmeteroL (ADVAIR) 250-50 mcg/dose diskus INHALE 1 PUFF BY MOUTH TWICE DAILY 180 each 4   ??? furosemide (LASIX) 40 MG tablet TAKE 1 TABLET BY MOUTH ONCE DAILY 90 tablet 3   ??? gabapentin (NEURONTIN) 300 MG capsule Take 2 capsules (600 mg total) by mouth nightly. 180 capsule 4   ??? hydrOXYzine (ATARAX) 25 MG tablet Take 1 tablet (25 mg total) by mouth every six (6) hours as needed for anxiety. 90 tablet 4   ??? spironolactone (ALDACTONE) 100 MG tablet TAKE 1 TABLET BY MOUTH ONCE DAILY 90 tablet 3   ??? tiotropium bromide (SPIRIVA RESPIMAT) 2.5 mcg/actuation inhalation mist Inhale 2 puffs by mouth once daily. 4 g 11 ??? ursodioL (ACTIGALL) 300 mg capsule Take 1 capsule (300 mg total) by mouth Two (2) times a day. 180 capsule 4   ??? XIFAXAN 550 mg Tab Take 1 tablet (550 mg total) by mouth Two (2) times a day. 180 tablet 3   ??? benzonatate (TESSALON PERLES) 100 MG capsule Take 1 capsule (100 mg total) by mouth every six (6) hours as needed for cough. (Patient not taking: Reported on 10/18/2019) 30 capsule 1   ??? doxycycline (VIBRAMYCIN) 100 MG capsule Take 1 capsule (100 mg total) by mouth Two (2) times a day. (Patient not taking: Reported on 10/18/2019) 14 capsule 0   ??? ferrous sulfate 325 (65 FE) MG tablet TAKE 1 TABLET BY MOUTH ONCE DAILY (Patient not taking: Reported on 10/18/2019) 90 tablet 4   ??? ibuprofen (ADVIL,MOTRIN) 400 MG tablet Take 1 tablet (400 mg total) by mouth every eight (8) hours as needed for pain. (Patient not taking: Reported on 10/18/2019)     ??? saliva stimulant comb. no.3 (BIOTINE) Spry Take 1 spray by mouth every two (2) hours as needed (dry mouth). (Patient not taking: Reported on 12/10/2018) 1 Bottle 0  No current facility-administered medications on file prior to visit.        Refills Requests: None requested    Screeners:Pt declines influenza vaccine.           PHQ-9 PHQ-9 TOTAL SCORE   10/21/2019 18   11/16/2018 7   04/27/2018 12   02/12/2018 17   12/04/2017 11                 [facility]  [place] [facility] [place] [facility]  [address], [place], [place] [address] ??? Telephone [phone] ??? Fax [phone]  [email or web address]

## 2019-10-22 NOTE — Unmapped (Signed)
See patient outreach encounter

## 2019-11-02 MED ORDER — ALENDRONATE 70 MG TABLET
ORAL_TABLET | ORAL | 11 refills | 28 days | Status: CP
Start: 2019-11-02 — End: 2020-11-01

## 2019-11-02 NOTE — Unmapped (Signed)
Addended by: Orland Mustard on: 11/02/2019 03:24 PM     Modules accepted: Orders

## 2019-11-05 ENCOUNTER — Encounter: Admit: 2019-11-05 | Discharge: 2019-11-07 | Payer: PRIVATE HEALTH INSURANCE

## 2019-11-08 NOTE — Unmapped (Signed)
New diagnosis, likely related to her liver disease. Her baseline labs do not show vit d deficiency. We will treat with bisphosphonate. Discussed how to take including sitting up for at least 30 mintues. On review of last EGD does not appear she has any abnormalities that would be a contraindication to treat this way. If she does not tolerate would refer to endocrine to discuss other treatment options. Will plan to repeat DEXA in 2 years.

## 2019-11-08 NOTE — Unmapped (Signed)
Followed by GI. New concern for possible hepatic encephalopathy. Encouraged her to trial the new medication. She is interested in a second opinon at [facility]. I have put in a referral. She would like to transfer potentially.

## 2019-11-13 NOTE — Unmapped (Signed)
I called to do a clinical assessment and refill coordination and Ms Carino said she has not started the Xifaxan yet because she got scared after reading about the side effects.  I told her that the chances of her having bad side effects is extremely low and that almost all the Xifaxan patients I talk to are very pleased with the results regarding thinking more clearly.  Also, I told her that many of the side effects listed in the leaflet we send are more common in people who take higher doses of the medicine for other conditions like IBS or Traveller's Diarrhea.  She said that she will start taking the Xifaxan tomorrow, 11/13/19.  I told her that I will give her a call on 11/29/19 to check on her and schedule her refill.    Corliss Skains. [place], [place].D.  Specialty Pharmacist  [facility]  [phone] option 4

## 2019-11-17 DIAGNOSIS — G2581 Restless legs syndrome: Principal | ICD-10-CM

## 2019-11-17 MED ORDER — FERROUS SULFATE 325 MG (65 MG IRON) TABLET
ORAL_TABLET | 4 refills | 0 days | Status: CP
Start: 2019-11-17 — End: ?

## 2019-11-17 NOTE — Unmapped (Signed)
Patient Advice Request - Ancillary Staff [facility] Name and relation (if other than patient): Dawn  Question/Concern for provider (Specific): Patient says Dr. Lennox Pippins was supposed to be sending iron pills and flonase but the [facility] has yet to receive anything. She says they talked about it through [place] but when she responded Dr. Lennox Pippins did not confirm if they were called in or not.  Best Contact Time: any  Other info: n/a

## 2019-11-18 NOTE — Unmapped (Signed)
Patient would like her prescription sent to [facility] in [place], [place]- [address]. [facility] no longer uses [facility]- Chart has been updated.

## 2019-11-18 NOTE — Unmapped (Signed)
All med prescribed from this clinic have be moved to pt requested pharmacy

## 2019-11-18 NOTE — Unmapped (Signed)
Ferrous Sulfate refilled to [facility] pharmacy. Nurse does not see an order for Flonase. Will message provider.

## 2019-11-23 MED ORDER — FLUTICASONE PROPIONATE 50 MCG/ACTUATION NASAL SPRAY,SUSPENSION
Freq: Every day | NASAL | 4 refills | 0 days | Status: CP
Start: 2019-11-23 — End: 2020-02-21

## 2019-11-24 NOTE — Unmapped (Signed)
flonase sent

## 2019-11-24 NOTE — Unmapped (Signed)
Addended by: Orland Mustard on: 11/23/2019 05:01 PM     Modules accepted: Orders

## 2019-11-29 NOTE — Unmapped (Signed)
Reviewed message. No action needed at this time.

## 2019-12-01 ENCOUNTER — Encounter: Admit: 2019-12-01 | Discharge: 2019-12-02 | Payer: PRIVATE HEALTH INSURANCE

## 2019-12-01 LAB — FERRITIN: Ferritin:MCnc:Pt:Ser/Plas:Qn:: 29.5

## 2019-12-01 MED ORDER — PRAMIPEXOLE 0.125 MG TABLET
ORAL_TABLET | Freq: Every evening | ORAL | 11 refills | 30 days | Status: CP
Start: 2019-12-01 — End: 2020-11-30

## 2019-12-01 NOTE — Unmapped (Addendum)
ASSESSMENT/PLAN:    Problem List Items Addressed This Visit        Digestive    Primary biliary cirrhosis (CMS-HCC)     Has been followed by [facility]. She would like second opinion and potentially to transfer to [facility]. Has appt with Dr. Allena Katz on 01/06/20. She is interested in getting off as many medications as she can. Discussed that likely not able to get off her liver meds, but that potentially with the guidance of her liver doc we could see if she could tolerate less diuretic. She is not taking the medication for HE. It would be one thing that we would need to explore with the new liver doc. She is now taking treatment for her osteoporosis which is likely 2/2 her liver disease.             Respiratory    Mild sleep apnea     Will continue flonase. ENT referral as below.          Relevant Orders    Ambulatory referral to ENT       Musculoskeletal and Integument    Osteoporosis without current pathological fracture     Recently started bisphosphonate. Tolerating fine at this time.          Seborrheic dermatitis    Relevant Orders    Ambulatory referral to Dermatology       Other    Nasal congestion     Significant nasal congestion, new snoring, blood tinged nasal discharge. In setting of her mild apnea and no improvement on flonase 2 sprays daily, will refer to ENT for consideration of visualization and further eval of these symptoms.          Relevant Orders    Ambulatory referral to ENT    Palpable purpura (CMS-HCC)     She is worried about the rash on her arm and her face. The rash on her right arm is palpable purpura. She has been seen at a satelittle [place] clinic but would like to be seen in the  derm office. Will refer for this. She would also like to follow up on the seb derm and the itchy spots (SKs) on her back. Will place referral.          Relevant Orders    Ambulatory referral to Dermatology    Restless leg - Primary     Continues to have uncontrolled symptoms of restless leg confirmed on sleep study. It is not controlled with her gabapentin 600mg  and she feels overly medicated on this. We discussed lyrica vs a medication in the same class as ropinorol. Will use pramexipole in setting of her liver disease.   -will start with low dose of pramipexole nightly (2-3 hours before bed)  -discontinue gabapentin.   -she really does not want to take iron daily. Will try to take at least 2x per week and she is going to increase her iron rich meats. She likes chicken livers so will increase those inher diet as well as beef/pork.   -will get baseline ferritin today as has only been on the iron for about a week.          Relevant Medications    pramipexole (MIRAPEX) 0.125 MG tablet    Other Relevant Orders    Ferritin (Completed)             Follow-up: Return in about 4 weeks (around 12/29/2019) for follow up in person for liver/iron.  I personally spent 41 minutes face-to-face and non-face-to-face in the care of this patient, which includes all pre, intra, and post visit time on the date of service.      Chief Complaint   Patient presents with   ??? Follow-up     continued leg pain, continued restless leg, snoring is worse considering stopping all medications, nose bleeds       SUBJECTIVE:    Mary Owens is a 59 y.o. female that presents to clinic today regarding the following issues:  -just recently started the flonase. Blood clots in nose since the summer. Blows nose a lot. Feels stopped up at times. Doesn't feel any better with the flonase. Is snoring (which is new per her partner).   -taking the iron a week. Doesn't want to take these pills andwants to know if there are foods she can take instead.  -hasn't started the med from GI (for HE)  -she has [facility] visit 3/4.   -is taking the osteoporosis med  -having rash on right arm and on face. Is palpable. Sometimes will go away and then will come back  -shaking a lot wondering if it is the advair or spiriva. Not needing any albuterol. Breathing has been good  -weight continues to increase. She thinks she is eating too much.       Review of Systems as above    I have reviewed the patients problem list, current medications, allergies, past, family and social history and updated them as needed.    Mary Owens  reports that she quit smoking about 3 years ago. Her smoking use included cigarettes. She has a 15.00 pack-year smoking history. She has never used smokeless tobacco.    OBJECTIVE:    Mary Owens  height is 166.4 cm (5' 5.5) and weight is 74.5 kg (164 lb 3.2 oz). Her temporal temperature is 35.7 ??C (96.2 ??F). Her blood pressure is 124/57 and her pulse is 87.   Wt Readings from Last 3 Encounters:   12/01/19 74.5 kg (164 lb 3.2 oz)   10/18/19 72.6 kg (160 lb)   08/20/19 72 kg (158 lb 12.8 oz)      PHQ-9 PHQ-9 TOTAL SCORE   12/01/2019 15   10/21/2019 18   11/16/2018 7   04/27/2018 12   02/12/2018 17   12/04/2017 11   11/06/2017 22     PHQ-2 Score:     PHQ-9 Score:  PHQ-9 TOTAL SCORE: 15  GAD-7 Score:  GAD-7 Total Score: 10  Physical Exam   Constitutional: She appears well-developed and well-nourished. No distress.   HENT:   Mask in place. Two purpura on right cheek under mask, not palpated.    Pulmonary/Chest: Effort normal.   Skin:   Linear palpable purpura on right forearm. Non tender. Multiple dry SKs on back.

## 2019-12-01 NOTE — Unmapped (Addendum)
[phone]??  Dr. Allena Katz March 4th    Beef chuck, flank, and bottom round cuts of beef.  Pork loin, sirloin, tenderloin, and picnic shoulder cuts of pork.  Chicken Liver

## 2019-12-01 NOTE — Unmapped (Signed)
Has been followed by [facility]. She would like second opinion and potentially to transfer to [facility]. Has appt with Dr. Allena Katz on 01/06/20. She is interested in getting off as many medications as she can. Discussed that likely not able to get off her liver meds, but that potentially with the guidance of her liver doc we could see if she could tolerate less diuretic. She is not taking the medication for HE. It would be one thing that we would need to explore with the new liver doc. She is now taking treatment for her osteoporosis which is likely 2/2 her liver disease.

## 2019-12-02 NOTE — Unmapped (Signed)
Recently started bisphosphonate. Tolerating fine at this time.

## 2019-12-03 NOTE — Unmapped (Signed)
Will continue flonase. ENT referral as below.

## 2019-12-03 NOTE — Unmapped (Signed)
Continues to have uncontrolled symptoms of restless leg confirmed on sleep study. It is not controlled with her gabapentin 600mg  and she feels overly medicated on this. We discussed lyrica vs a medication in the same class as ropinorol. Will use pramexipole in setting of her liver disease.   -will start with low dose of pramipexole nightly (2-3 hours before bed)  -discontinue gabapentin.   -she really does not want to take iron daily. Will try to take at least 2x per week and she is going to increase her iron rich meats. She likes chicken livers so will increase those inher diet as well as beef/pork.   -will get baseline ferritin today as has only been on the iron for about a week.

## 2019-12-03 NOTE — Unmapped (Signed)
She is worried about the rash on her arm and her face. The rash on her right arm is palpable purpura. She has been seen at a satelittle [place] clinic but would like to be seen in the [place] derm office. Will refer for this. She would also like to follow up on the seb derm and the itchy spots (SKs) on her back. Will place referral.

## 2019-12-03 NOTE — Unmapped (Signed)
Significant nasal congestion, new snoring, blood tinged nasal discharge. In setting of her mild apnea and no improvement on flonase 2 sprays daily, will refer to ENT for consideration of visualization and further eval of these symptoms.

## 2019-12-06 DIAGNOSIS — J441 Chronic obstructive pulmonary disease with (acute) exacerbation: Principal | ICD-10-CM

## 2019-12-06 MED ORDER — AZITHROMYCIN 250 MG TABLET
ORAL_TABLET | ORAL | 0 refills | 5 days | Status: CP
Start: 2019-12-06 — End: 2019-12-11

## 2019-12-06 MED ORDER — PREDNISONE 20 MG TABLET
ORAL_TABLET | Freq: Every day | ORAL | 0 refills | 5.00000 days | Status: CP
Start: 2019-12-06 — End: 2019-12-11

## 2019-12-06 MED ORDER — BENZONATATE 100 MG CAPSULE
ORAL_CAPSULE | Freq: Three times a day (TID) | ORAL | 1 refills | 10.00000 days | Status: CP | PRN
Start: 2019-12-06 — End: 2020-12-05

## 2019-12-07 NOTE — Unmapped (Addendum)
I called to perform a clinical assessment and Ms. Cambria said that she has not started taking Xifaxan because she read too many things about the drug that scared her.  During our last conversation, she agreed to start taking the medication and I asked her why she told me she was going to start and she stated it was to get me off of the phone.  When I tried to ask her another question the call disconnected abruptly.  I called her back in case the connection dropped accidentally. I told her the [employer] is used to prevent her from having to go to the hospital due to her liver disease.   She said to please not call her anymore and that she was not going to start taking the medication until she talks to her [facility] doctor.   The call  disconnected abruptly again.     Corliss Skains. [place], [place].D.  Specialty Pharmacist  [facility]  [phone] option 4

## 2019-12-07 NOTE — Unmapped (Signed)
Attempted to call patient to discuss MAB therapy for high-risk patients with COVID-19. No answer and no voicemail. Will try again another time.

## 2019-12-07 NOTE — Unmapped (Signed)
Pt called to report that she tested positive on 12/04/19. Appointment rescheduled to 01/20/20. Pt in agreement.

## 2019-12-08 DIAGNOSIS — J988 Other specified respiratory disorders: Principal | ICD-10-CM

## 2019-12-08 DIAGNOSIS — U071 COVID-19: Principal | ICD-10-CM

## 2019-12-08 NOTE — Unmapped (Signed)
Mary Owens  [birth year]    Due to your recent positive COVID-19 result, [facility] has the capability to offer Regeneron therapy.  The [place] FDA has issued an Emergency Use Authorization to permit the emergency use of the unapproved product Regeneron for the treatment of mild to moderate coronavirus disease 2019 (COVID19).      This treatment may help to prevent progression on to severe symptoms and/or hospitalization in those who are confirmed to be high-risk COVID (+) and are within 10 days of symptom onset. Could you tell me when your symptoms started 12/04/19.      Patient is 59 y.o. years old.  Patient current weight:  Wt Readings from Last 1 Encounters:   12/01/19 74.5 kg (164 lb 3.2 oz)        Inclusion Criteria: Patient is 55 years or older AND has a diagnosis of ONE of the following which makes them eligible for infusion therapy: COPD/other chronic respiratory disease, OR      Advised patient that those receiving monoclonal or plasma products should wait 90 days until getting immunized.    Answered all questions utilizing FAQ sheet and patient has declined treatment therapy.  She reports that she is feeling so much better after starting Azithromycin, prednisone, and tessalon so she doesn't think she needs any more treatment. I did tell her to be in touch with Dr. Sinclair Ship should she change her mind and we are happy to discuss again. Patient was appreciative of the call.      Current Outpatient Medications   Medication Sig Dispense Refill   ??? albuterol 2.5 mg /3 mL (0.083 %) nebulizer solution Inhale 3 mL (2.5 mg total) by nebulization every four (4) hours as needed for wheezing. 90 mL 2   ??? albuterol HFA 90 mcg/actuation inhaler Inhale 2 puffs by mouth every four (4) hours as needed for wheezing. 18 g 4   ??? alendronate (FOSAMAX) 70 MG tablet Take 1 tablet (70 mg total) by mouth every seven (7) days. Take in the morning with a full glass of water, on an empty stomach, and do not take anything else by mouth or lie down for the next 30 min. 4 tablet 11   ??? azithromycin (ZITHROMAX) 250 MG tablet Take 2 tablets (500 mg total) by mouth daily for 1 day, THEN 1 tablet (250 mg total) daily for 4 days. 6 tablet 0   ??? benzonatate (TESSALON PERLES) 100 MG capsule Take 1 capsule (100 mg total) by mouth Three (3) times a day as needed for cough. 30 capsule 1   ??? ferrous sulfate 325 (65 FE) MG tablet TAKE 1 TABLET BY MOUTH ONCE DAILY 90 tablet 4   ??? fluticasone propion-salmeteroL (ADVAIR) 250-50 mcg/dose diskus INHALE 1 PUFF BY MOUTH TWICE DAILY 180 each 4   ??? fluticasone propionate (FLONASE) 50 mcg/actuation nasal spray 2 sprays by Each Nare route daily. 48 g 4   ??? furosemide (LASIX) 40 MG tablet TAKE 1 TABLET BY MOUTH ONCE DAILY 90 tablet 3   ??? gabapentin (NEURONTIN) 300 MG capsule Take 2 capsules (600 mg total) by mouth nightly. 180 capsule 4   ??? hydrOXYzine (ATARAX) 25 MG tablet Take 1 tablet (25 mg total) by mouth every six (6) hours as needed for anxiety. 90 tablet 4   ??? pramipexole (MIRAPEX) 0.125 MG tablet Take 1 tablet (0.125 mg total) by mouth nightly. 30 tablet 11   ??? predniSONE (DELTASONE) 20 MG tablet Take 2 tablets (40 mg total) by mouth  daily for 5 days. 10 tablet 0   ??? saliva stimulant comb. no.3 (BIOTINE) Spry Take 1 spray by mouth every two (2) hours as needed (dry mouth). (Patient not taking: Reported on 12/10/2018) 1 Bottle 0   ??? spironolactone (ALDACTONE) 100 MG tablet TAKE 1 TABLET BY MOUTH ONCE DAILY 90 tablet 3   ??? tiotropium bromide (SPIRIVA RESPIMAT) 2.5 mcg/actuation inhalation mist Inhale 2 puffs by mouth once daily. 4 g 11   ??? ursodioL (ACTIGALL) 300 mg capsule Take 1 capsule (300 mg total) by mouth Two (2) times a day. 180 capsule 4   ??? XIFAXAN 550 mg Tab Take 1 tablet (550 mg total) by mouth Two (2) times a day. 180 tablet 3     No current facility-administered medications for this visit.

## 2019-12-09 ENCOUNTER — Encounter
Admit: 2019-12-09 | Discharge: 2019-12-09 | Payer: PRIVATE HEALTH INSURANCE | Attending: Critical Care Medicine | Primary: Critical Care Medicine

## 2019-12-09 ENCOUNTER — Encounter: Admit: 2019-12-09 | Discharge: 2019-12-09 | Payer: PRIVATE HEALTH INSURANCE

## 2019-12-09 DIAGNOSIS — J988 Other specified respiratory disorders: Principal | ICD-10-CM

## 2019-12-09 DIAGNOSIS — U071 COVID-19: Principal | ICD-10-CM

## 2019-12-09 NOTE — Unmapped (Signed)
Would like to proceed with B[facility], referral sent for [facility]

## 2019-12-09 NOTE — Unmapped (Signed)
Phone Visit Note  This medical encounter was conducted virtually using [email or web address]  TeleHealth protocols.      I have identified myself to the patient and conveyed my credentials to Mary Owens  Patient/proxy has provided verbal consent to proceed with this phone visit.  In case we get disconnected, patient's phone number is [phone] (home)   In case of Emergency, patient's current location: Home: [id number]  [place] [address]  Is there someone else in the room? No.       Mary Owens is a 59 y.o. female requesting a telephone visit today regarding the following issues:    Assessment/Plan:      Problem List Items Addressed This Visit        Other    COVID-19            Date of symptom onset: 12/04/19       Date of positive SARS-CoV-2 PCR: 12/04/19       Exposure: ?       Therapy: referred for MAB 2/3 (she declined on 2/2). Prior to results started treatment for COPD exacerbation on azithromycin, prednisone    We discussed MAB. With her history of COPD and her age I am worried about her risk of severe disease. She would like to be re-referred for MAB. We discussed length of isolation and due to her neutropenia I think she should likely isolate for 21 days instead of just 10.                              Followup:  No follow-ups on file.    I spent 18 minutes on the phone with the patient on the date of service. I spent an additional 0 minutes on pre- and post-visit activities.     The patient was physically located in [place] or a state in which I am permitted to provide care. The patient and/or parent/guardian understood that s/he may incur co-pays and cost sharing, and agreed to the telemedicine visit. The visit was reasonable and appropriate under the circumstances given the patient's presentation at the time.    The patient and/or parent/guardian has been advised of the potential risks and limitations of this mode of treatment (including, but not limited to, the absence of in-person examination) and has agreed to be treated using telemedicine. The patient's/patient's family's questions regarding telemedicine have been answered.     If the visit was completed in an ambulatory setting, the patient and/or parent/guardian has also been advised to contact their provider???s office for worsening conditions, and seek emergency medical treatment and/or call 911 if the patient deems either necessary.         Coding: Physician/NP/PA: Use the following LOS codes: 64403 (5-10 min), 99442 (11-20 min), 99443 (21-30 min) *medical discussion only, can't include pre and post time.        Subjective:     HPI  Mary Owens is a 59 y.o. female requesting a telephone visit to discuss the following issues:    She declined MAB yesterday because she was feeling better. Today feels more fatigued. She continues to have cough, fatigue. Her family has told her she should consider antibody treatment as well. She was told to quarantine until 2/6. She is curious about how long she is contagious. She felt better yesterday after starting the medications for a COPD exacerbation.    ROS  As per HPI.      HISTORY:  I have reviewed the patient's problem list, current medications, and allergies and have updated/reconciled them as needed.      Objective:     As part of this Telephone Visit, no in-person exam was conducted.

## 2019-12-09 NOTE — Unmapped (Signed)
Patient referred for: [facility]    Outcome: Scheduled visit.    Scheduling Notes: Pt gave verbal GCT.  Needs [facility], has not seen a provider.   Symptoms started on 1/27   Would like to proceed with [facility] infusion.

## 2019-12-09 NOTE — Unmapped (Signed)
Patient referred for: COVID [facility]    Outcome: Scheduled visit.    Scheduling Notes: Symptomatic patient (COVID +) -- exam needed.

## 2019-12-09 NOTE — Unmapped (Signed)
Assessment/ Plan     Mary Owens is a 59 y.o. female presenting to [facility] for evaluation in the setting of active COVID infection with symptom onset 12/01/19 and positive test result on 12/04/19. Today her chief complaint is shortness of breath and cough with sinus congestion. She is completing course of azithromycin and prednisone and endorses improvement with tessalon perles. She is scheduled to receive bamlanivimab monoclonal antibody infusion tomorrow. She was encouraged to continue aggressive symptomatic management as well as increasing frequency of albuterol inhaler, using her nebulizer treatments as directed use and addition of steam and sinus irrigation. She was also encouraged to increase PO fluid intake.    Any labs and radiology results that were available during my care of the patient were independently reviewed by me and considered in my medical decision making.    Encounter diagnoses:    Diagnosis ICD-10-CM   1. COVID-19 virus infection  U07.1   2. Chronic obstructive pulmonary disease, unspecified COPD type (CMS-HCC)  J44.9     IMPRESSION/PLAN:  1. BAM infusion appointment tomorrow  2. Complete course of zithromax and prednisone for COPD exacerbation as well as tessalon perles as directed  3. Counseled on aggressive symptomatic management with rest    Pt counseled on routine care for symptoms, see AVS for details.  COVID testing performed no.  Patient directed to Home given findings during today's visit.    Subjective     HPI:   Mary Owens is a 59 y.o. female who [facility] the [facility] with active COVID infection currently on day #8 since symptom onset. Most bothersome to her today is cough and SOB. She does have underlying COPD and endorses excellent adherence to Advair and Spiriva. She states she doesn't use her nebulizer much because it makes her feel shaky. She was encouraged to increase her use of abulterol inhaler to every 2-4 hours as needed for SOB. On exam she demonstrates very mild conversational dyspnea though resp rate and oxygenation are appropriate. Lung sounds are diminished bilaterally in lower fields. She had been prescribed course of prednisone and zithromax which she was encouraged to complete. Her blood pressure is quite low today and she tells me she held her fluid pill. Increasing PO fluid intake was encouraged. She has appointment for [facility] infusion tomorrow and was provided with directions to [facility]. Patient was educated that our goal is to address symptoms aiming to reduce duration of viral syndrome and keep her out of hospital. She was encouraged to reach out to [place] if symptoms progress despite her interventions.     Exposure History: In the last 14 days?     Have you traveled outside of [place]? No                City/State Country (if outside [place])                  Have you been in close contact with someone confirmed by a test to have COVID? (Close contact is within 6 feet for at least 10 minutes) Yes, Who? self         Worked in a health care facility?   No       Do you live in a facility like a nursing home, group home, or assisted living? No   Are you scheduled to receive cancer chemotherapy within the next 7 days? No   Have you ever been tested before for COVID-19 with  a swab of your nose? Yes, When: 12/04/19   Where: [facility]   Are you a healthcare worker being tested to return to work? No     Symptoms:   Do you currently have any of the following that developed in the last 7 days:    Subjective fever (felt feverish) No   Chills (especially repeated shaking chills) Yes, how many days? 6   Sever fatigue (felt very tired) Yes, how many days? 6   Muscle aches Yes, how many days? 6   Runny nose No   Sore Throat No   Loss of taste or smell No   Cough (new onset or worsening of chronic cough) Yes, how many days? 6   Shortness of breath Yes, how many days? 6   Nausea or vomiting No Headache Yes, how many days? 6   Abdominal Pain No   Diarrhea (3 or more loose stools in last 24 hours) No     Social Hx:  Who is in your household?: patient and boyfriend  Household members with high risk conditions?: no  Household members with COVID contact?: none known  Current smoker or second hand smoke exposure?: former smoker  Occupational exposure: none    Past History/Medical Conditions:  Allergies   Allergen Reactions   ??? Penicillins Rash     Other reaction(s): RASH  Other reaction(s): RASH   ??? Sulfa (Sulfonamide Antibiotics) Rash     Other reaction(s): RASH  Other reaction(s): RASH     Active Ambulatory Problems     Diagnosis Date Noted   ??? 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 Hemorrhoids 10/26/2014   ??? Constipation 10/26/2014   ??? Lower abdominal pain 10/25/2014   ??? Well woman exam (no gynecological exam) 01/19/2015   ??? 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 Neutropenia (CMS-HCC) 01/19/2016   ??? Abdominal pain 01/21/2017   ??? Ascites 01/26/2017   ??? Depression 01/30/2017   ??? Thrombocytopenia (CMS-HCC) 02/27/2017   ??? Parotitis 03/13/2018   ??? COPD (chronic obstructive pulmonary disease) (CMS-HCC) 03/13/2018   ??? Macular erythematous rash 06/10/2018   ??? Clostridium difficile infection 09/18/2018   ??? Cirrhosis of liver (CMS-HCC) 12/10/2018   ??? 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 COPD (chronic obstructive pulmonary disease) (CMS-HCC) 12/10/2018   ??? Restless leg 09/16/2019   ??? Osteoporosis without current pathological fracture 11/07/2019   ??? Nasal congestion 12/03/2019   ??? Mild sleep apnea 12/03/2019   ??? Seborrheic dermatitis 12/03/2019   ??? Palpable purpura (CMS-HCC) 12/03/2019   ??? COVID-19 12/08/2019     Resolved Ambulatory Problems     Diagnosis Date Noted   ??? Biliary cirrhosis (CMS-HCC) 07/27/2012   ??? Obstructive chronic bronchitis (CMS-HCC) 12/16/2011   ??? 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 Vomiting 01/21/2017   ??? Norovirus 01/26/2017   ??? Iron deficiency anemia 12/04/2017     Past Medical History:   Diagnosis Date   ??? Abscess, jaw    ??? Alcohol abuse, in remission    ??? Arthritis    ??? Drug abuse (CMS-HCC)    ??? Hepatic steatosis    ??? Hidradenitis    ??? PBC (primary biliary cirrhosis)    ??? Severe vulvar dysplasia    ??? Smoker    ??? Splenomegaly      Do you have any of the following:   Asthma or emphysema or COPD Yes   Cystic Fibrosis No   Diabetes No  High Blood Pressure  No   Cardiovascular Disease No   Chronic Kidney Disease No   Chronic Liver Disease Yes   Chronic blood disorder like Sickle Cell Disease  No   Weak immune system due to disease or medication Yes, Which? Cirrhhois   Neurologic condition that limits movement  No   Developmental delay - Moderate to Severe  No   Recent (within past 2 weeks) or current Pregnancy No   Morbid Obesity (>100 pounds over ideal weight) No   Current Smoker No   Former Smoker Yes     Review of Systems   Constitutional: Positive for chills and malaise/fatigue.   HENT: Positive for congestion.    Respiratory: Positive for cough and shortness of breath.    Musculoskeletal: Positive for myalgias.   Neurological: Positive for headaches.     Objective     BP 99/53  - Pulse 91  - Temp 37.3 ??C (99.2 ??F) (Oral)  - Resp 24 Comment: room air - LMP 02/02/2013  - SpO2 96%     Physical Exam   Constitutional: She appears well-developed and well-nourished. She appears ill. No distress.   HENT:   Nose: Mucosal edema and rhinorrhea present. Right sinus exhibits maxillary sinus tenderness. Left sinus exhibits maxillary sinus tenderness.   Mouth/Throat: Oropharynx is clear and moist. No oropharyngeal exudate.   Eyes: Conjunctivae are normal.   Neck: Neck supple.   Cardiovascular: Normal rate, regular rhythm, S1 normal and S2 normal.   No murmur heard.  Pulmonary/Chest: No accessory muscle usage. Tachypnea noted. No respiratory distress. She has decreased breath sounds in the right middle field, the right lower field and the left lower field. She has no wheezes.   Conversational dyspnea   Lymphadenopathy:     She has no cervical adenopathy.   Skin: She is not diaphoretic.     Scribe's Attestation: [employer] obtained and performed the history, physical exam and medical decision making elements that were entered into the chart.  Signed by Caswell Corwin, RN serving as Scribe, on 12/09/2019 1:02 PM    The documentation recorded by the scribe accurately reflects the service I personally performed and the decisions made by me. Bonifacio Pruden M. Evalyn Casco, DNP, APRN, AGPCNP-BC, AGNP-C December 09, 2019 1:02 PM

## 2019-12-09 NOTE — Unmapped (Signed)
Date of symptom onset: 12/04/19       Date of positive SARS-CoV-2 PCR: 12/04/19       Exposure: ?       Therapy: referred for MAB 2/3 (she declined on 2/2). Prior to results started treatment for COPD exacerbation on azithromycin, prednisone    We discussed MAB. With her history of COPD and her age I am worried about her risk of severe disease. She would like to be re-referred for MAB. We discussed length of isolation and due to her neutropenia I think she should likely isolate for 21 days instead of just 10.

## 2019-12-09 NOTE — Unmapped (Signed)
Patient tested COVID+ 12/04/2019.  Symptom onset 12/01/2019.  Today is day 9 of symptoms.   Patient reports SOB, fatigue, ear pressure, cough, body aches.  SOB with talking, moving.   Referral placed to [facility] for limited exam today.  Discussed BAM therapy- risks, benefits, alternatives.  Patient is interested- referral sent to [facility] coordinators.   Given ER precautions.  Patient verbalizes understanding and has no further questions at this time.

## 2019-12-09 NOTE — Unmapped (Signed)
As discussed you are a candidate for monoclonal antibody IV therapy for Covid-19 (bamlanivimab or casirivimab/imdevimab)  which is on EUA (Emergency Use Authorization). I have placed a referral to the Covid therapy coordinators and someone will be calling you.  In the meantime, please continue to isolate/quarantine. Stay home and do not go to work, school, the store, or any gatherings. If your symptoms worsen or you have questions or problems call the [facility] at [phone]  If your symptoms become severe and you cannot manage them at home, please call 911 or go directly to the Emergency Department.     Fact Sheet for Patients, Parents and Caregivers  Emergency Use Authorization (EUA) of Bamlanivimab for Coronavirus Disease 2019 (COVID-19)  You are being given a medicine called bamlanivimab for the treatment of coronavirus disease 2019 (COVID19). This Fact Sheet contains information to help you understand the potential risks and potential benefits of  taking bamlanivimab, which you may receive.  Receiving bamlanivimab may benefit certain people with COVID-19.  Read this Fact Sheet for information about bamlanivimab. Talk to your healthcare provider if you have  questions. It is your choice to receive bamlanivimab or stop it at any time.  What isCOVID-19?  COVID-19 is caused by a virus called a coronavirus. People can get COVID-19 through contact with  another person who has the virus.  COVID-19 illnesses have ranged from very mild (including some with no reported symptoms) to severe,  including illness resulting in death. While information so far suggests that most COVID-19 illness is mild,  serious illness can happen and may cause some of your other medical conditions to become worse. People  of all ages with severe, long-lasting (chronic) medical conditions like heart disease, lung disease, and  diabetes, for example, seem to be at higher risk of being hospitalized for COVID-19.  What are the symptoms of COVID-19?  The symptoms of COVID-19 include fever, cough, and shortness of breath, which may appear 2 to 14 days after  exposure. Serious illness including breathing problems can occur and may cause your other medical conditions to  become worse.  What isbamlanivimab?  Bamlanivimab is an investigational medicine used for the treatment of COVID-19 in non-hospitalized adults and  adolescents 14 years of age and older with mild to moderate symptoms who weigh 88 pounds (40 kg) or more,  and who are at high risk for developing severe COVID-19 symptoms or the need for hospitalization. Bamlanivimab  is investigational because it is still being studied. There is limited information known about the safety or  effectiveness of using bamlanivimab to treat people with COVID-19.  The FDA has authorized the emergency use of bamlanivimab for the treatment of COVID-19 under an Emergency  Use Authorization (EUA). For more information on EUA, see the section ???What is an Emergency Use  Authorization (EUA)???? at the end of this Fact Sheet.  What should I tell my healthcare provider before I receive bamlanivimab?  Tell your healthcare provider about all of your medical conditions, including if you:  ??? Have any allergies  ??? Are pregnant or plan to become pregnant  ??? Are breastfeeding or plan to breastfeed  ??? Have any serious illnesses  ??? Are taking any medications (prescription, over-the-counter, vitamins, and herbal products)  How will I receive bamlanivimab?  ??? Bamlanivimab is given to you through a vein (intravenous or IV) for at least 1 hour.  ??? You will receive one dose of bamlanivimab by IV infusion.  What are the important possible  side effects of bamlanivimab?  Possible side effects of bamlanivimab are:  ??? Allergic reactions. Allergic reactions can happen during and after infusion with bamlanivimab. Tell your  healthcare provider right away if you get any of the following signs and symptoms of allergic reactions: fever,  2 chills, nausea, headache, shortness of breath, low blood pressure, wheezing, swelling of your lips, face, or  throat, rash including hives, itching, muscle aches, and dizziness.  The side effects of getting any medicine by vein may include brief pain, bleeding, bruising of the skin, soreness,  swelling, and possible infection at the infusion site.  These are not all the possible side effects of bamlanivimab. Not a lot of people have been given bamlanivimab.  Serious and unexpected side effects may happen. Bamlanivimab is still being studied so it is possible that all of  the risks are not known at this time.  It is possible that bamlanivimab could interfere with your body's own ability to fight off a future infection of  SARS-CoV-2. Similarly, bamlanivimab may reduce your body???s immune response to a vaccine for SARS-CoV-2.  Specific studies have not been conducted to address these possible risks. Talk to your healthcare provider if you  have any questions.  What other treatment choices are there?  Like bamlanivimab, FDA may allow for the emergency use of other medicines to treat people with COVID-19. Go  to UKRank.es for information on the emergency use of other medicines that  are not approved by FDA to treat people with COVID-19. Your healthcare provider may talk with you about clinical  trials you may be eligible for.  It is your choice to be treated or not to be treated with bamlanivimab. Should you decide not to receive  bamlanivimab or stop it at any time, it will not change your standard medical care.  What if I am pregnant or breastfeeding?  There is limited experience treating pregnant women or breastfeeding mothers with bamlanivimab. For a mother  and unborn baby, the benefit of receiving bamlanivimab may be greater than the risk from the treatment. If you  are pregnant or breastfeeding, discuss your options and specific situation with your healthcare provider.  How do I report side effects with bamlanivimab?  Tell your healthcare provider right away if you have any side effect that bothers you or does not go away.  Report side effects to FDA MedWatch at [employer], call 1-800-FDA-1088, or contact [employer] at [employer] [phone]).  How can I learn more?  ??? Ask your healthcare provider  ??? Visit TVRaw.pl  ??? Visit UKRank.es  ??? Contact your local or state public health department  What is an Emergency Use Authorization (EUA)?  The [place] FDA has made bamlanivimab available under an emergency access mechanism called an  EUA. The EUA is supported by a Surveyor, minerals and Human Service (HHS) declaration that circumstances  exist to justify the emergency use of drugs and biological products during the COVID-19 pandemic.  Bamlanivimab has not undergone the same type of review as an FDA-approved or cleared product. The FDA may  issue an EUA when certain criteria are met, which includes that there are no adequate, approved, and available  alternatives. In addition, the FDA decision is based on the totality of scientific evidence available showing that it is  reasonable to believe that the product meets certain criteria for safety, performance, and labeling and may be  effective in treatment of patients during the COVID-19 pandemic. All of these  criteria must be met to allow for the  product to be used in the treatment of patients during the COVID-19 pandemic.  3  The EUA for bamlanivimab is in effect for the duration of the COVID-19 declaration justifying emergency use of  these products, unless terminated or revoked (after which the product may no longer be used).  Literature issued November 2020  [employer] and [place], [place], [place] [address], [place]  Copyright ?? 2020, [employer] and [employer]. All rights reserved.  5.0-BAM-0000-EUA [id number]

## 2019-12-09 NOTE — Unmapped (Addendum)
Thanks for coming today when you weren't feeling well. Your infusion appointment is tomorrow at the [facility]. Please try to irrigate your sinuses at least once a day - 2-3 times is even better. Increase use of albuterol inhaler to at least every 4 hours. Continue your medicines as directed. Try to get some more fluids in. Be aggressive with treating symptoms in order that they may resolve sooner. Rest when you need to - you may not feel better immediately. Take care.

## 2019-12-09 NOTE — Unmapped (Signed)
TeleHealth Phone Encounter  This medical encounter was conducted virtually using [email or web address]  TeleHealth protocols.      I have identified myself to the patient and conveyed my credentials to [name]  In case we get disconnected, patient's phone number is [phone] (home)    Is there someone else in the room? Yes. What is your relationship? friend. Do you want this person here for the visit? yes.    Assessment/Plan:      1. COVID-19      Problem List Items Addressed This Visit        Other    COVID-19 - Primary     Patient tested COVID+ 12/04/2019.  Symptom onset 12/01/2019.  Today is day 9 of symptoms.   Patient reports SOB, fatigue, ear pressure, cough, body aches.  SOB with talking, moving.   Referral placed to [facility] for limited exam today.  Discussed BAM therapy- risks, benefits, alternatives.  Patient is interested- referral sent to [facility] coordinators.   Given ER precautions.  Patient verbalizes understanding and has no further questions at this time.             Dispostion  Based upon the overall clinical picture, we recommend limited exam and evaluation for testing at one of our [facility] Centers      Based upon the patient's age and comorbidity of COPD, cirrhosis, patient's positive Covid-19 diagnosis,current clinical evaluation with mild to moderate symptoms, and length of symptoms less than 10 days they are an appropriate candidate for monoclonal antibody IV therapy for Covid-19 (bamlanivimab or casirivimab/imdevimab)  Additionally I feel it is appropriate to manage current symptoms supportively at home. Patient also instructed the following: If your symptoms worsen or you have questions or problems call the [facility] at [phone]  If your symptoms become severe and you cannot manage them at home, please call 911 or go directly to the Emergency Department.    Follow up  Return if symptoms worsen or fail to improve.     Subjective:     Mary Owens is a 59 y.o. female who presents for a telephone visit with respiratory symptoms amidst COVID-19 outbreak.     HPI  Patient tested COVID+ 12/04/2019. Tested at [facility].  Symptom onset 12/01/2019.    Patient reports fatigue, ear pressure.  Cough is dry, but some clear mucus does come up at times.  Did feel SOB last night, a little better this morning.  Using albuterol, but doesn't seem to help.  Feels SOB just sitting and talking.  No fever.  Had chills at the beginning, but resolved now.  Having body aches.    Hx of COPD. Cirrhosis.     She is interested in [facility] infusion.    ROS  As per HPI.    I have reviewed the problem list, medications, and allergies and have updated/reconciled them if needed.        Objective:     As part of this Telephone Visit, no in-person exam was conducted.       I spent 9 minutes on the phone with the patient on the date of service. I spent an additional 10 minutes on pre- and post-visit activities.     The patient was physically located in [place] or a state in which I am permitted to provide care. The patient and/or parent/guardian understood that s/he may incur co-pays and cost sharing, and agreed to the telemedicine visit. The visit was reasonable and appropriate under the circumstances  given the patient's presentation at the time.    The patient and/or parent/guardian has been advised of the potential risks and limitations of this mode of treatment (including, but not limited to, the absence of in-person examination) and has agreed to be treated using telemedicine. The patient's/patient's family's questions regarding telemedicine have been answered.     If the visit was completed in an ambulatory setting, the patient and/or parent/guardian has also been advised to contact their provider???s office for worsening conditions, and seek emergency medical treatment and/or call 911 if the patient deems either necessary.

## 2019-12-10 ENCOUNTER — Encounter: Admit: 2019-12-10 | Discharge: 2019-12-11 | Payer: PRIVATE HEALTH INSURANCE

## 2019-12-10 DIAGNOSIS — J988 Other specified respiratory disorders: Secondary | ICD-10-CM

## 2019-12-10 DIAGNOSIS — U071 COVID-19: Principal | ICD-10-CM

## 2019-12-10 NOTE — Unmapped (Signed)
0940 Pt arrived for Bamlanivimab for COVID.     0953 Bamlanivimab 700 mg IV infusing over 1 hr via PIV.    1053 Bamlanivimab infusion completed.     1150 1 hr monitoring completed.Flushed with NS. PIV removed. Reviewed AVS with pt.   Pt  was provided with the ???Fact Sheet for Patients, Parents and Caregivers???, has been informed of alternatives to receiving authorized bamlanivimab, and has been Informed that Bamlanivimab is an unapproved drug that is authorized for use under this Emergency Use Authorization  Reverbalized understanding.  Dc'd from Infusion Center.

## 2019-12-10 NOTE — Unmapped (Signed)
Discharge Instructions for Bamlanivimab     After your infusion therapy, you can contact your primary care provider or our Clinical Contact Center [phone] for minor symptoms.     For severe symptoms, please dial 911.     A [facility] nurse will be calling you to check in tomorrow as well.       Those receiving monoclonal or plasma products should wait 90 days until getting immunized.     Thank you for visiting the [facility]. Should you have any questions or concerns about your infusion, please do not hesitate to contact [facility] at [phone] or the nurses' station at [phone].      Fact Sheet for Patients, Parents and Caregivers    Emergency Use Authorization (EUA) of Bamlanivimab for Coronavirus Disease 2019 (COVID-19)    You are being given a medicine called Bamlanivimab for the treatment of coronavirus disease 2019 (COVID19). This Fact Sheet contains information to help you understand the potential risks and potential benefits of taking bamlanivimab, which you may receive.    Receiving Bamlanivimab may benefit certain people with COVID-19.    Read this Fact Sheet for information about Bamlanivimab. Talk to your healthcare provider if you have  questions. It is your choice to receive bamlanivimab or stop it at any time.    What is COVID-19?    COVID-19 is caused by a virus called a coronavirus. People can get COVID-19 through contact with  another person who has the virus.  COVID-19 illnesses have ranged from very mild (including some with no reported symptoms) to severe,  including illness resulting in death. While information so far suggests that most COVID-19 illness is mild,  serious illness can happen and may cause some of your other medical conditions to become worse. People  of all ages with severe, long-lasting (chronic) medical conditions like heart disease, lung disease, and  diabetes, for example, seem to be at higher risk of being hospitalized for COVID-19.    What are the symptoms of COVID-19?    The symptoms of COVID-19 include fever, cough, and shortness of breath, which may appear 2 to 14 days after  exposure. Serious illness including breathing problems can occur and may cause your other medical conditions to  become worse.    What is Bamlanivimab?    Bamlanivimab is an investigational medicine used for the treatment of COVID-19 in non-hospitalized adults and  adolescents 12 years of age and older with mild to moderate symptoms who weigh 88 pounds (40 kg) or more,  and who are at high risk for developing severe COVID-19 symptoms or the need for hospitalization. Bamlanivimab  is investigational because it is still being studied. There is limited information known about the safety or  effectiveness of using bamlanivimab to treat people with COVID-19.    The FDA has authorized the emergency use of bamlanivimab for the treatment of COVID-19 under an Emergency  Use Authorization (EUA). For more information on EUA, see the section ???What is an Emergency Use  Authorization (EUA)???? at the end of this Fact Sheet.    What should I tell my healthcare provider before I receive Bamlanivimab?    Tell your healthcare provider about all of your medical conditions, including if you:  ??? Have any allergies  ??? Are pregnant or plan to become pregnant  ??? Are breastfeeding or plan to breastfeed  ??? Have any serious illnesses  ??? Are taking any medications (prescription, over-the-counter, vitamins, and herbal products)  How will   I receive bamlanivimab?  ??? Bamlanivimab is given to you through a vein (intravenous or IV) for at least 1 hour.  ??? You will receive one dose of bamlanivimab by IV infusion.    What are the important possible side effects of Bamlanivimab?    Possible side effects of bamlanivimab are:    ??? Allergic reactions. Allergic reactions can happen during and after infusion with bamlanivimab. Tell your  healthcare provider right away if you get any of the following signs and symptoms of allergic reactions: fever,  chills, nausea, headache, shortness of breath, low blood pressure, wheezing, swelling of your lips, face, or  throat, rash including hives, itching, muscle aches, and dizziness.    The side effects of getting any medicine by vein may include brief pain, bleeding, bruising of the skin, soreness,  swelling, and possible infection at the infusion site.    These are not all the possible side effects of Bamlanivimab. Not a lot of people have been given bamlanivimab.  Serious and unexpected side effects may happen. Bamlanivimab is still being studied so it is possible that all of  the risks are not known at this time.    It is possible that bamlanivimab could interfere with your body's own ability to fight off a future infection of  SARS-CoV-2. Similarly, Bamlanivimab may reduce your body???s immune response to a vaccine for SARS-CoV-2.  Specific studies have not been conducted to address these possible risks. Talk to your healthcare provider if you  have any questions.    What other treatment choices are there?    Like Bamlanivimab, FDA may allow for the emergency use of other medicines to treat people with COVID-19. Go  to https://www.covid19treatmentguidelines.nih.gov/ for information on the emergency use of other medicines that  are not approved by FDA to treat people with COVID-19. Your healthcare provider may talk with you about clinical  trials you may be eligible for.    It is your choice to be treated or not to be treated with bamlanivimab. Should you decide not to receive  bamlanivimab or stop it at any time, it will not change your standard medical care.    What if I am pregnant or breastfeeding?  There is limited experience treating pregnant women or breastfeeding mothers with bamlanivimab. For a mother  and unborn baby, the benefit of receiving bamlanivimab may be greater than the risk from the treatment. If you  are pregnant or breastfeeding, discuss your options and specific situation with your healthcare provider.    How do I report side effects with bamlanivimab?  Tell your healthcare provider right away if you have any side effect that bothers you or does not go away.  Report side effects to FDA MedWatch at www.fda.gov/medwatch, call 1-800-FDA-1088, or contact [employer] at 1-855-LillyC19 (1-855-545-5921).        CDC Guidelines For When to Be Around Others or Return to Work    When You Can be Around Others  Updated Dec. 1, 2020  Facebook Twitter LinkedIn Syndicate    If you have or think you might have COVID-19, it is important to stay home and away from other people. Staying away from others helps stop the spread of COVID-19. If you have an emergency warning sign (including trouble breathing), get emergency medical care immediately.    I think or know I had COVID-19, and I had symptoms  You can be around others after:    10 days since symptoms first appeared and    24 hours with no fever without the use of fever-reducing medications and  Other symptoms of COVID-19 are improving*  *Loss of taste and smell may persist for weeks or months after recovery and need not delay the end of isolation    Most people do not require testing to decide when they can be around others; however, if your healthcare provider recommends testing, they will let you know when you can resume being around others based on your test results.    Note that these recommendations do not apply to persons with severe COVID-19 or with severely weakened immune systems (immunocompromised). These persons should follow the guidance below for ???I was severely ill with COVID-19 or have a severely weakened immune system (immunocompromised) due to a health condition or medication. When can I be around others????    I tested positive for COVID-19 but had no symptoms    If you continue to have no symptoms, you can be with others after 10 days have passed since you had a positive viral test for COVID-19. Most people do not require testing to decide when they can be around others; however, if your healthcare provider recommends testing, they will let you know when you can resume being around others based on your test results.    If you develop symptoms after testing positive, follow the guidance above for ???I think or know I had COVID-19, and I had symptoms.???    I was severely ill with COVID-19 or have a severely weakened immune system (immunocompromised) due to a health condition or medication. When can I be around others?    People who are severely ill with COVID-19 might need to stay home longer than 10 days and up to 20 days after symptoms first appeared. Persons who are severely immunocompromised may require testing to determine when they can be around others. Talk to your healthcare provider for more information. If testing is available in your community, it may be recommended by your healthcare provider. Your healthcare provider will let you know if you can resume being around other people based on the results of your testing.    Your doctor may work with an infectious disease expert or your local health department to determine whether testing will be necessary before you can be around others.    For Anyone Who Has Been Around a Person with COVID-19    Anyone who has had close contact with someone with COVID-19 should stay home for 14 days after their last exposure to that person.    The best way to protect yourself and others is to stay home for 14 days if you think you???ve been exposed to someone who has COVID-19. Check your local health department???s website for information about options in your area to possibly shorten this quarantine period.  However, anyone who has had close contact with someone with COVID-19 and who meets the following criteria does NOT need to stay home.    Has COVID-19 illness within the previous 3 months and  Has recovered and  Remains without COVID-19 symptoms (for example, cough, shortness of breath)  Confirmed and suspected cases of reinfection of the virus that causes COVID-19  Cases of reinfection of COVID-19 have been reported but are rare. In general, reinfection means a person was infected (got sick) once, recovered, and then later became infected again. Based on what we know from similar viruses, some reinfections are expected.

## 2019-12-11 ENCOUNTER — Encounter
Admit: 2019-12-11 | Discharge: 2019-12-12 | Disposition: A | Discharge status: Home / Self Care | Payer: PRIVATE HEALTH INSURANCE

## 2019-12-11 DIAGNOSIS — U071 COVID-19: Principal | ICD-10-CM

## 2019-12-11 DIAGNOSIS — J988 Other specified respiratory disorders: Principal | ICD-10-CM

## 2019-12-11 DIAGNOSIS — J441 Chronic obstructive pulmonary disease with (acute) exacerbation: Principal | ICD-10-CM

## 2019-12-11 LAB — CBC W/ AUTO DIFF
BASOPHILS ABSOLUTE COUNT: 0 10*9/L (ref 0.0–0.1)
BASOPHILS RELATIVE PERCENT: 0.5 %
EOSINOPHILS ABSOLUTE COUNT: 0 10*9/L (ref 0.0–0.4)
EOSINOPHILS RELATIVE PERCENT: 0.2 %
HEMATOCRIT: 38.5 % (ref 36.0–46.0)
HEMOGLOBIN: 12.8 g/dL (ref 12.0–16.0)
LARGE UNSTAINED CELLS: 2 % (ref 0–4)
LYMPHOCYTES ABSOLUTE COUNT: 0.5 10*9/L — ABNORMAL LOW (ref 1.5–5.0)
MEAN CORPUSCULAR HEMOGLOBIN CONC: 33.3 g/dL (ref 31.0–37.0)
MEAN CORPUSCULAR HEMOGLOBIN: 30.8 pg (ref 26.0–34.0)
MEAN CORPUSCULAR VOLUME: 92.3 fL (ref 80.0–100.0)
MEAN PLATELET VOLUME: 10.5 fL — ABNORMAL HIGH (ref 7.0–10.0)
MONOCYTES ABSOLUTE COUNT: 0.1 10*9/L — ABNORMAL LOW (ref 0.2–0.8)
MONOCYTES RELATIVE PERCENT: 5.1 %
NEUTROPHILS ABSOLUTE COUNT: 1.4 10*9/L — ABNORMAL LOW (ref 2.0–7.5)
NEUTROPHILS RELATIVE PERCENT: 66.8 %
RED BLOOD CELL COUNT: 4.18 10*12/L (ref 4.00–5.20)
RED CELL DISTRIBUTION WIDTH: 19.7 % — ABNORMAL HIGH (ref 12.0–15.0)
WBC ADJUSTED: 2 10*9/L — ABNORMAL LOW (ref 4.5–11.0)

## 2019-12-11 LAB — COMPREHENSIVE METABOLIC PANEL
ALBUMIN: 3.2 g/dL — ABNORMAL LOW (ref 3.5–5.0)
ALKALINE PHOSPHATASE: 177 U/L — ABNORMAL HIGH (ref 38–126)
ALT (SGPT): 29 U/L (ref <35)
ANION GAP: 5 mmol/L — ABNORMAL LOW (ref 7–15)
AST (SGOT): 42 U/L — ABNORMAL HIGH (ref 14–38)
BILIRUBIN TOTAL: 1.2 mg/dL (ref 0.0–1.2)
BLOOD UREA NITROGEN: 13 mg/dL (ref 7–21)
BUN / CREAT RATIO: 18
CALCIUM: 8.1 mg/dL — ABNORMAL LOW (ref 8.5–10.2)
CHLORIDE: 105 mmol/L (ref 98–107)
CO2: 25 mmol/L (ref 22.0–30.0)
CREATININE: 0.72 mg/dL (ref 0.60–1.00)
EGFR CKD-EPI AA FEMALE: >90 mL/min/{1.73_m2} (ref >=60)
EGFR CKD-EPI NON-AA FEMALE: >90 mL/min/{1.73_m2} (ref >=60)
GLUCOSE RANDOM: 127 mg/dL (ref 70–179)
PROTEIN TOTAL: 6.7 g/dL (ref 6.5–8.3)
SODIUM: 135 mmol/L (ref 135–145)

## 2019-12-11 LAB — SMEAR REVIEW

## 2019-12-11 LAB — PROTEIN TOTAL: Protein:MCnc:Pt:Ser/Plas:Qn:: 6.7

## 2019-12-11 LAB — VARIABLE HEMOGLOBIN CONCENTRATION

## 2019-12-11 MED ORDER — PREDNISONE 20 MG TABLET
ORAL_TABLET | 0 refills | 0 days | Status: CP
Start: 2019-12-11 — End: ?

## 2019-12-11 NOTE — Unmapped (Signed)
Recent:   What is the date of your last related visit?  N/A  Related acute medications Rx'd:  N/A  Home treatment tried:  Last albuterol neb and inhaler last night - advised to take treatment now    Relevant:   Allergies: N/A  Medications: N/A  Health History: N/A   Weight: N/A           Reason for Disposition  ??? MODERATE difficulty breathing (e.g., speaks in phrases, SOB even at rest, pulse 100-120)     Speaking with some difficulty, sounds SOB. Waiting for friend to return to take her to ED. Declines using EMS but advised to call 911 if breathing/SOB worsens. Advised to use albuterol inhaler now    Answer Assessment - Initial Assessment Questions  1. COVID-19 DIAGNOSIS: Who made your Coronavirus (COVID-19) diagnosis? Was it confirmed by a positive lab test? If not diagnosed by a HCP, ask Are there lots of cases (community spread) where you live? (See public health department website, if unsure)      COVID + test 12-04-19  2. COVID-19 EXPOSURE: Was there any known exposure to COVID before the symptoms began? CDC Definition of close contact: within 6 feet (2 meters) for a total of 15 minutes or more over a 24-hour period.       Not asked due to concern about breathing  3. ONSET: When did the COVID-19 symptoms start?       12-01-19  4. WORST SYMPTOM: What is your worst symptom? (e.g., cough, fever, shortness of breath, muscle aches)      SOB and severe cough  5. COUGH: Do you have a cough? If so, ask: How bad is the cough?        Yes - severe  6. FEVER: Do you have a fever? If so, ask: What is your temperature, how was it measured, and when did it start?      Not asked due to concern about breathing  7. RESPIRATORY STATUS: Describe your breathing? (e.g., shortness of breath, wheezing, unable to speak)       SOB  8. BETTER-SAME-WORSE: Are you getting better, staying the same or getting worse compared to yesterday?  If getting worse, ask, In what way?      worse   9. HIGH RISK DISEASE: Do you have any chronic medical problems? (e.g., asthma, heart or lung disease, weak immune system, obesity, etc.)      Asthma  10. PREGNANCY: Is there any chance you are pregnant? When was your last menstrual period?        Not asked due to concern about breathing  11. OTHER SYMPTOMS: Do you have any other symptoms?  (e.g., chills, fatigue, headache, loss of smell or taste, muscle pain, sore throat; new loss of smell or taste especially support the diagnosis of COVID-19)        Not asked doe to concern about breathing    Protocols used: CORONAVIRUS (COVID-19) DIAGNOSED OR SUSPECTED-A-AH

## 2019-12-11 NOTE — Unmapped (Signed)
[facility]  Emergency Department Provider Note    ED Clinical Impression     Final diagnoses:   COVID-19 (Primary)   COPD exacerbation (CMS-HCC)       Initial Impression, ED Course, Assessment and Plan     Impression:     Mary Owens 49 F with history of primary biliary cirrhosis,thrombocytopenia, neutropenia, COPD and recent COVID infection (s/p MAB infusion on 12/10/19) p/w for COPD exacerbation in setting of COVID-19 infection. Previously treated by PCP with azithromycin and prednisone x5 days. Stable. Afebrile. Satting in low-mid 90s on room air. S/p Prednisone 60mg  x1, ibuprofen 600mg  x1 in ED. Discharge home with prednisone taper x6 days. Recommend follow-up with PCP.         1730: CBC significant for WBC 2.0 (near baseline), Plt 58. CMP AST 42, ALT 29, ALP 177 (near baseline).   Patient seen and evaluated. Lying down in bed. NAD. Productive cough. Afebrile. VSS. Satting in 90s on RA. Recently finished azithromycin and prednisone 40mg  x5 day course. Will order CXR and albuterol inhaler.     1756: Prednisone 60mg  x1. Ibuprofen 600mg  for pain.     1800: Requesting more cranberry juice and ginger ale.     1930: Patient requesting diet.      2030: CXR atelactasis vs. Consolidation. Stable. Afebrile. Resting comfortably in bed satting 95% on room air. NAD. Ambulatory O2 sat 91-92% satisfactory in setting of COPD.  Reports some improvement since ibuprofen and prednisone 60mg  x1. Discussed discharge will continue 6 day prednisone taper. Discussed return precautions and recommended follow-up with PCP.     2100: Patient ready for discharge.   Additional Medical Decision Making     I have reviewed the vital signs and the nursing notes. Labs and radiology results that were available during my care of the patient were independently reviewed by me and considered in my medical decision making.     I staffed the case with the ED attending, Dr. Katrinka Blazing.    I independently visualized the EKG tracing.   I independently visualized the radiology images.   I reviewed the patient's prior medical records that were available for viewing.   .     Portions of this record have been created using Scientist, clinical (histocompatibility and immunogenetics). Dictation errors have been sought, but may not have been identified and corrected.  ____________________________________________       History     Chief Complaint  Shortness of Breath      HPI   Mary Owens is a 59 y.o. female with COPD, neutropenia, bilary cirrhosis, and COVID infection (s/p BAM infusion 12/10/19) p/w continued SOB and cough. Reports worsened cough and SOB since infusion. Using albuterol inhaler/nebulizer, but states that it increases her cough. Copious clear sputum production, treated for COPD exacerbation by primary care on 12/06/2019 prescribed azithromycin 500mg  x1 day; then azithromycin 250mg  x4 days, prednisone 40mg  daily x5 days, and tessalon perles. Patient reports compliance with medications.           Past Medical History:   Diagnosis Date   ??? Abscess, jaw    ??? Alcohol abuse, in remission    ??? Arthritis    ??? COPD (chronic obstructive pulmonary disease) (CMS-HCC)    ??? Depression    ??? Drug abuse (CMS-HCC)    ??? Hepatic steatosis    ??? Hidradenitis     h/o   ??? Iron deficiency anemia 12/04/2017   ??? Neutropenia (CMS-HCC)     liver vs drug related,  improved   ??? PBC (primary biliary cirrhosis)    ??? Severe vulvar dysplasia    ??? Smoker    ??? Splenomegaly    ??? Tobacco use disorder, moderate, 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       Patient Active Problem List   Diagnosis   ??? Carcinoma in situ of vulva   ??? Hemorrhoids   ??? Constipation   ??? Lower abdominal pain   ??? Well woman exam (no gynecological exam)   ??? Hx vulvar dysplasia   ??? Neutropenia (CMS-HCC)   ??? Abdominal pain   ??? Ascites   ??? Depression   ??? Thrombocytopenia (CMS-HCC)   ??? Parotitis   ??? COPD (chronic obstructive pulmonary disease) (CMS-HCC)   ??? Macular erythematous rash   ??? Clostridium difficile infection   ??? Cirrhosis of liver (CMS-HCC)   ??? Primary biliary cirrhosis (CMS-HCC)   ??? COPD (chronic obstructive pulmonary disease) (CMS-HCC)   ??? Restless leg   ??? Osteoporosis without current pathological fracture   ??? Nasal congestion   ??? Mild sleep apnea   ??? Seborrheic dermatitis   ??? Palpable purpura (CMS-HCC)   ??? COVID-19       Past Surgical History:   Procedure Laterality Date   ??? PR COLORECTAL SCRN; HI RISK IND N/A 05/29/2017    Procedure: COLOREC CANCR SCR; COLNSCPY;  Surgeon: Janyth Pupa, MD;  [facility];  Service: Gastroenterology   ??? PR COLSC FLX W/RMVL OF TUMOR POLYP LESION SNARE TQ Left 02/24/2014    Procedure: COLONOSCOPY FLEX; W/REMOV TUMOR/LES BY SNARE;  Surgeon: Malcolm Metro, MD;  Location: [facility];  Service: Gastroenterology   ??? PR UPPER GI ENDOSCOPY,BIOPSY N/A 02/24/2014    Procedure: UGI ENDOSCOPY; WITH BIOPSY, SINGLE OR MULTIPLE;  Surgeon: Malcolm Metro, MD;  [facility];  Service: Gastroenterology   ??? PR UPPER GI ENDOSCOPY,DIAGNOSIS N/A 05/29/2017    Procedure: UGI ENDO, INCLUDE ESOPHAGUS, STOMACH, & DUODENUM &/OR JEJUNUM; DX W/WO COLLECTION SPECIMN, BY BRUSH OR WASH;  Surgeon: Janyth Pupa, MD;  Location: [facility];  Service: Gastroenterology   ??? SKIN BIOPSY     ??? VULVECTOMY           Current Facility-Administered Medications:   ???  albuterol (PROVENTIL HFA;VENTOLIN HFA) 90 mcg/actuation inhaler 2 puff, 2 puff, Inhalation, Q6H PRN, Janit Bern, MD, 2 puff at 12/11/19 1828    Current Outpatient Medications:   ???  albuterol 2.5 mg /3 mL (0.083 %) nebulizer solution, Inhale 3 mL (2.5 mg total) by nebulization every four (4) hours as needed for wheezing., Disp: 90 mL, Rfl: 2  ???  albuterol HFA 90 mcg/actuation inhaler, Inhale 2 puffs by mouth every four (4) hours as needed for wheezing., Disp: 18 g, Rfl: 4  ???  alendronate (FOSAMAX) 70 MG tablet, Take 1 tablet (70 mg total) by mouth every seven (7) days. Take in the morning with a full glass of water, on an empty stomach, and do not take anything else by mouth or lie down for the next 30 min., Disp: 4 tablet, Rfl: 11  ???  azithromycin (ZITHROMAX) 250 MG tablet, Take 2 tablets (500 mg total) by mouth daily for 1 day, THEN 1 tablet (250 mg total) daily for 4 days., Disp: 6 tablet, Rfl: 0  ???  benzonatate (TESSALON PERLES) 100 MG capsule, Take 1 capsule (100 mg total) by mouth Three (3) times a day as needed for cough., Disp: 30  capsule, Rfl: 1  ???  ferrous sulfate 325 (65 FE) MG tablet, TAKE 1 TABLET BY MOUTH ONCE DAILY, Disp: 90 tablet, Rfl: 4  ???  fluticasone propion-salmeteroL (ADVAIR) 250-50 mcg/dose diskus, INHALE 1 PUFF BY MOUTH TWICE DAILY, Disp: 180 each, Rfl: 4  ???  fluticasone propionate (FLONASE) 50 mcg/actuation nasal spray, 2 sprays by Each Nare route daily., Disp: 48 g, Rfl: 4  ???  furosemide (LASIX) 40 MG tablet, TAKE 1 TABLET BY MOUTH ONCE DAILY, Disp: 90 tablet, Rfl: 3  ???  gabapentin (NEURONTIN) 300 MG capsule, Take 2 capsules (600 mg total) by mouth nightly., Disp: 180 capsule, Rfl: 4  ???  hydrOXYzine (ATARAX) 25 MG tablet, Take 1 tablet (25 mg total) by mouth every six (6) hours as needed for anxiety., Disp: 90 tablet, Rfl: 4  ???  pramipexole (MIRAPEX) 0.125 MG tablet, Take 1 tablet (0.125 mg total) by mouth nightly., Disp: 30 tablet, Rfl: 11  ???  predniSONE (DELTASONE) 20 MG tablet, 3 po daily for 1 days, then 2 po daily for 2 days, then 1 po daily for 2 days, Disp: 9 tablet, Rfl: 0  ???  saliva stimulant comb. no.3 (BIOTINE) Spry, Take 1 spray by mouth every two (2) hours as needed (dry mouth). (Patient not taking: Reported on 12/10/2018), Disp: 1 Bottle, Rfl: 0  ???  spironolactone (ALDACTONE) 100 MG tablet, TAKE 1 TABLET BY MOUTH ONCE DAILY, Disp: 90 tablet, Rfl: 3  ???  tiotropium bromide (SPIRIVA RESPIMAT) 2.5 mcg/actuation inhalation mist, Inhale 2 puffs by mouth once daily., Disp: 4 g, Rfl: 11  ???  ursodioL (ACTIGALL) 300 mg capsule, Take 1 capsule (300 mg total) by mouth Two (2) times a day., Disp: 180 capsule, Rfl: 4  ???  XIFAXAN 550 mg Tab, Take 1 tablet (550 mg total) by mouth Two (2) times a day., Disp: 180 tablet, Rfl: 3    Allergies  Penicillins and Sulfa (sulfonamide antibiotics)    Family History   Problem Relation Age of Onset   ??? Cancer Mother         brain   ??? Asthma Mother    ??? Hypertension Father    ??? Heart failure Father    ??? Melanoma Maternal Grandmother    ??? Basal cell carcinoma Neg Hx    ??? Squamous cell carcinoma Neg Hx        Social History  Social History     Tobacco Use   ??? Smoking status: Former Smoker     Packs/day: 0.50     Years: 30.00     Pack years: 15.00     Types: Cigarettes     Quit date: 11/05/2016     Years since quitting: 3.0   ??? Smokeless tobacco: Never Used   Substance Use Topics   ??? Alcohol use: No     Comment: previous heavy use, quit 2010   ??? Drug use: No     Frequency: 2.0 times per week     Types: Marijuana     Comment: Current marijuana use; hx of cocaine use, hasn't used cocaine in 8 months per patient       Review of Systems  Constitutional: Negative for fever.  Eyes: Negative for visual changes.  ENT: Negative for sore throat.  CV: Negative for chest pain.  Resp: Positive for cough. Positive for shortness of breath.  GI: Negative for abdominal pain.  GU: Negative for dysuria.  MSK: Negative for back pain.  Derm: Negative for rash.  Neuro:  Negative for headaches.      Physical Exam     ED Triage Vitals [12/11/19 1446]   Enc Vitals Group      BP 104/72      Heart Rate 98      SpO2 Pulse       Resp 25      Temp 36.6 ??C (97.9 ??F)      Temp Source Oral      SpO2 98 %      Weight       Height       Head Circumference       Peak Flow       Pain Score       Pain Loc       Pain Edu?       Excl. in GC?        Constitutional: Appears stated age. Resting comfortably in bed.  HEENT: Normocephalic and atraumatic.Conjunctivae clear. No congestion. Moist mucous membranes.   Heme/Lymph/Immuno: No petechiae or bruising  CV: RRR, no murmurs.   Resp: Clear to auscultation bilaterally. Faint inspiratory/expiratory wheezes. Cough with inspiration. Satting upper 90s on room air.  GI: Soft and non tender, non distended.   MSK: Normal range of motion in all extremities.  Neuro: Normal speech and language. No gross focal neurologic deficits appreciated.  Skin: Warm, dry and intact.  Psychiatric: Mood and affect are normal. Speech and behavior are normal.    EKG     NS at 91 bpm. Normal axis and intervals. No ST elevations or depressions.      Radiology   CXR     IMPRESSION:  ??  Hazy bibasilar opacities; atelectasis versus consolidation.    Procedures       Anthonette Legato, MD  [facility]           Janit Bern, MD  Resident  12/11/19 [phone]

## 2019-12-11 NOTE — Unmapped (Signed)
Reason for Disposition  ??? Health Information question, no triage required and triager able to answer question    Answer Assessment - Initial Assessment Questions  1. REASON FOR CALL or QUESTION: What is your reason for calling today? or How can I best help you? or What question do you have that I can help answer?        Please refer to earlier nurse triage note.  Patient is COVID + with a history of COPD.  She underwent BAM therapy yesterday and called the nurse triage line today to report worsening shortness of breath.  She was advised to go to the closest ED for evaluation.  She is currently on her way and she wants to know what information needs to be provided to the hospital staff upon her arrival.      Patient advised to wear her mask, maintain social distancing and let staff know that she is COVID + as soon as she arrives.  She understands to let them know that she was advised to go to the ED by our nursing team so that the documentation in her chart can be reviewed when she gets there.    Protocols used: INFORMATION ONLY CALL - NO TRIAGE-A-AH

## 2019-12-11 NOTE — Unmapped (Signed)
[facility] Post Infusion Check-In (Day 1)    Follow-up call to [facility] infusion therapy patient completed.       Date of positive COVID test result: 12-04-19    Date of onset of COVID symptoms: 12-01-19      Compared to pre-infusion are you feeling better, worse or same: Worse      Are you experiencing any of the following?   ? Worsening fever: No thermometer  ? Chills: No   ? Nausea: Yes - mild  ? Headache: Yes - Severe  ? Bronchospasm: No  ? Hypotension: No - not monitored  ? Angioedema: No  ? Throat irritation: No - mild soreness  ? Rash including urticaria: No  ? Pruritus: No  ? Myalgia: No   ? Dizziness: Yes -mild    Advised Call your health care provider or the COVID Helpline Post Infusion number [phone]) if you have new or worsening symptoms.    COVID sxs - coughing and states she is SOB - Triaged for COVID - see triage

## 2019-12-11 NOTE — Unmapped (Signed)
Confirmed COVID ++ 1/30. Tested positive over at [employer] Here today for worsening SOB and coughing. Had BAM infusion done yesterday. H/x of COPD.

## 2019-12-12 NOTE — Unmapped (Signed)
Pt d/c'ed home with fm, to f/u with pmd, take meds as ordered, increase po fluids and isolate. Pt ambulated from ed, steady gait

## 2019-12-12 NOTE — Unmapped (Signed)
Patient rounds completed. The following patient needs were addressed:  Pain, Toileting, Positioning: SUPINE with HOB elevated, Personal Belongings, Plan of Care, Call Bell in Reach and Bed in lowest position. Wheels locked. Pt resting in NAD. No other issue. vss

## 2019-12-12 NOTE — Unmapped (Signed)
Patient rounds completed. The following patient needs were addressed:  Pain, Toileting, Positioning: lying on the right. Personal Belongings, Plan of Care, Call Bell in Reach and Bed in lowest position. Wheels locked. Pt resting in NAD. No other issue. vss

## 2019-12-12 NOTE — Unmapped (Signed)
.  Patient from triage escorted to room 79. Pt paced on cardiac monitor, continuous pulse oximeter and continuous blood pressure monitoring.

## 2019-12-15 NOTE — Unmapped (Signed)
This patient has been disenrolled from the [facility] specialty pharmacy services.   After onboarding patient in December to start Xifaxan, patient has not started the medication and has told me to not call her anymore. She said she will not start taking it until she talks to her [facility] doctor because she read scary things about the medication.  I could not convince her of the benefits of taking Xifaxan.Marland Kitchen    Roderic Palau  [facility] Specialty Pharmacist

## 2019-12-29 ENCOUNTER — Encounter: Admit: 2019-12-29 | Discharge: 2019-12-30 | Payer: PRIVATE HEALTH INSURANCE

## 2019-12-29 DIAGNOSIS — E611 Iron deficiency: Principal | ICD-10-CM

## 2019-12-29 DIAGNOSIS — E876 Hypokalemia: Principal | ICD-10-CM

## 2019-12-29 MED ORDER — VITAMIN B COMPLEX CAPSULE
ORAL_CAPSULE | Freq: Every day | ORAL | 11 refills | 30.00000 days | Status: CP
Start: 2019-12-29 — End: 2020-12-28

## 2020-01-03 ENCOUNTER — Encounter: Admit: 2020-01-03 | Discharge: 2020-01-04 | Payer: PRIVATE HEALTH INSURANCE

## 2020-01-03 DIAGNOSIS — L719 Rosacea, unspecified: Principal | ICD-10-CM

## 2020-01-03 DIAGNOSIS — L299 Pruritus, unspecified: Principal | ICD-10-CM

## 2020-01-03 DIAGNOSIS — L821 Other seborrheic keratosis: Principal | ICD-10-CM

## 2020-01-03 DIAGNOSIS — L219 Seborrheic dermatitis, unspecified: Principal | ICD-10-CM

## 2020-01-03 MED ORDER — RIFAMPIN 150 MG CAPSULE
ORAL_CAPSULE | Freq: Every day | ORAL | 1 refills | 90.00000 days | Status: CP
Start: 2020-01-03 — End: ?

## 2020-01-04 ENCOUNTER — Ambulatory Visit: Admit: 2020-01-04 | Discharge: 2020-01-05 | Payer: PRIVATE HEALTH INSURANCE

## 2020-01-20 ENCOUNTER — Ambulatory Visit
Admit: 2020-01-20 | Discharge: 2020-01-21 | Payer: PRIVATE HEALTH INSURANCE | Attending: Hematology & Oncology | Primary: Hematology & Oncology

## 2020-01-20 DIAGNOSIS — D696 Thrombocytopenia, unspecified: Principal | ICD-10-CM

## 2020-01-20 DIAGNOSIS — D708 Other neutropenia: Principal | ICD-10-CM

## 2020-01-26 ENCOUNTER — Encounter: Admit: 2020-01-26 | Discharge: 2020-01-27 | Payer: PRIVATE HEALTH INSURANCE

## 2020-01-26 DIAGNOSIS — G4733 Obstructive sleep apnea (adult) (pediatric): Principal | ICD-10-CM

## 2020-01-26 DIAGNOSIS — K743 Primary biliary cirrhosis: Principal | ICD-10-CM

## 2020-01-26 DIAGNOSIS — J449 Chronic obstructive pulmonary disease, unspecified: Principal | ICD-10-CM

## 2020-01-26 DIAGNOSIS — K8309 Other cholangitis: Principal | ICD-10-CM

## 2020-01-26 DIAGNOSIS — G2581 Restless legs syndrome: Principal | ICD-10-CM

## 2020-01-26 DIAGNOSIS — G4730 Sleep apnea, unspecified: Principal | ICD-10-CM

## 2020-01-26 MED ORDER — FLUTICASONE PROPIONATE 50 MCG/ACTUATION NASAL SPRAY,SUSPENSION
Freq: Two times a day (BID) | NASAL | 4 refills | 0 days | Status: CP
Start: 2020-01-26 — End: 2020-04-25

## 2020-01-26 MED ORDER — URSODIOL 300 MG CAPSULE
ORAL_CAPSULE | Freq: Two times a day (BID) | ORAL | 4 refills | 90 days | Status: CP
Start: 2020-01-26 — End: 2020-04-25

## 2020-03-10 MED ORDER — ALENDRONATE 70 MG TABLET
ORAL_TABLET | ORAL | 11 refills | 28 days | Status: CP
Start: 2020-03-10 — End: 2021-03-10

## 2020-03-20 ENCOUNTER — Ambulatory Visit: Admit: 2020-03-20 | Discharge: 2020-03-21 | Payer: PRIVATE HEALTH INSURANCE

## 2020-03-20 ENCOUNTER — Encounter: Admit: 2020-03-20 | Discharge: 2020-03-21 | Payer: PRIVATE HEALTH INSURANCE

## 2020-03-30 ENCOUNTER — Encounter
Admit: 2020-03-30 | Discharge: 2020-04-28 | Payer: PRIVATE HEALTH INSURANCE | Attending: Rehabilitative and Restorative Service Providers" | Primary: Rehabilitative and Restorative Service Providers"

## 2020-03-31 ENCOUNTER — Ambulatory Visit: Admit: 2020-03-31 | Discharge: 2020-04-01 | Payer: PRIVATE HEALTH INSURANCE

## 2020-04-07 ENCOUNTER — Ambulatory Visit: Admit: 2020-04-07 | Discharge: 2020-04-08 | Payer: PRIVATE HEALTH INSURANCE

## 2020-04-11 ENCOUNTER — Encounter: Admit: 2020-04-11 | Discharge: 2020-04-12 | Payer: PRIVATE HEALTH INSURANCE

## 2020-04-11 DIAGNOSIS — J310 Chronic rhinitis: Principal | ICD-10-CM

## 2020-04-11 DIAGNOSIS — H6123 Impacted cerumen, bilateral: Principal | ICD-10-CM

## 2020-04-25 ENCOUNTER — Ambulatory Visit
Admit: 2020-04-25 | Discharge: 2020-04-26 | Payer: PRIVATE HEALTH INSURANCE | Attending: Family Medicine | Primary: Family Medicine

## 2020-04-25 DIAGNOSIS — M25551 Pain in right hip: Principal | ICD-10-CM

## 2020-04-25 DIAGNOSIS — M898X8 Other specified disorders of bone, other site: Principal | ICD-10-CM

## 2020-04-25 DIAGNOSIS — M25559 Pain in unspecified hip: Principal | ICD-10-CM

## 2020-04-25 DIAGNOSIS — M6798 Unspecified disorder of synovium and tendon, other site: Principal | ICD-10-CM

## 2020-04-25 MED ORDER — MELOXICAM 15 MG TABLET
ORAL_TABLET | Freq: Every day | ORAL | 1 refills | 30.00000 days | Status: CP
Start: 2020-04-25 — End: ?

## 2020-05-05 ENCOUNTER — Ambulatory Visit
Admit: 2020-05-05 | Discharge: 2020-05-28 | Payer: PRIVATE HEALTH INSURANCE | Attending: Rehabilitative and Restorative Service Providers" | Primary: Rehabilitative and Restorative Service Providers"

## 2020-05-05 ENCOUNTER — Ambulatory Visit: Admit: 2020-05-05 | Discharge: 2020-05-06 | Payer: MEDICAID

## 2020-05-09 DIAGNOSIS — R928 Other abnormal and inconclusive findings on diagnostic imaging of breast: Principal | ICD-10-CM

## 2020-05-11 ENCOUNTER — Ambulatory Visit: Admit: 2020-05-11 | Discharge: 2020-05-12 | Payer: PRIVATE HEALTH INSURANCE

## 2020-06-09 ENCOUNTER — Ambulatory Visit
Admit: 2020-06-09 | Discharge: 2020-06-27 | Payer: PRIVATE HEALTH INSURANCE | Attending: Rehabilitative and Restorative Service Providers" | Primary: Rehabilitative and Restorative Service Providers"

## 2020-06-09 DIAGNOSIS — M25551 Pain in right hip: Principal | ICD-10-CM

## 2020-06-14 ENCOUNTER — Ambulatory Visit: Admit: 2020-06-14 | Discharge: 2020-06-15 | Payer: PRIVATE HEALTH INSURANCE

## 2020-06-27 ENCOUNTER — Ambulatory Visit
Admit: 2020-06-27 | Discharge: 2020-06-28 | Payer: PRIVATE HEALTH INSURANCE | Attending: Family Medicine | Primary: Family Medicine

## 2020-06-27 ENCOUNTER — Ambulatory Visit: Admit: 2020-06-27 | Discharge: 2020-06-28 | Payer: PRIVATE HEALTH INSURANCE

## 2020-06-29 ENCOUNTER — Ambulatory Visit: Admit: 2020-06-29 | Discharge: 2020-06-30 | Payer: PRIVATE HEALTH INSURANCE

## 2020-08-02 ENCOUNTER — Ambulatory Visit: Admit: 2020-08-02 | Discharge: 2020-08-03 | Payer: PRIVATE HEALTH INSURANCE

## 2020-08-02 MED ORDER — CLOBETASOL 0.05 % SCALP SOLUTION
Freq: Two times a day (BID) | TOPICAL | 4 refills | 0.00000 days | Status: CP
Start: 2020-08-02 — End: 2021-08-02

## 2020-08-02 MED ORDER — CLOBETASOL 0.05 % TOPICAL OINTMENT
Freq: Two times a day (BID) | TOPICAL | 0 refills | 0.00000 days | Status: CN
Start: 2020-08-02 — End: 2021-08-02

## 2020-08-03 DIAGNOSIS — F419 Anxiety disorder, unspecified: Principal | ICD-10-CM

## 2020-08-03 MED ORDER — HYDROXYZINE HCL 25 MG TABLET
ORAL_TABLET | 0 refills | 0 days
Start: 2020-08-03 — End: ?

## 2020-08-04 DIAGNOSIS — F419 Anxiety disorder, unspecified: Principal | ICD-10-CM

## 2020-08-04 MED ORDER — HYDROXYZINE HCL 25 MG TABLET
ORAL_TABLET | Freq: Four times a day (QID) | ORAL | 0 refills | 23.00000 days | Status: CP | PRN
Start: 2020-08-04 — End: 2021-08-04

## 2020-08-04 MED ORDER — HYDROXYZINE HCL 25 MG TABLET: 25 mg | tablet | Freq: Four times a day (QID) | 0 refills | 23 days | Status: AC

## 2020-09-11 DIAGNOSIS — J449 Chronic obstructive pulmonary disease, unspecified: Principal | ICD-10-CM

## 2020-09-11 MED ORDER — ALBUTEROL SULFATE HFA 90 MCG/ACTUATION AEROSOL INHALER
RESPIRATORY_TRACT | 4 refills | 17.00000 days | Status: CP | PRN
Start: 2020-09-11 — End: 2020-12-08

## 2020-09-14 ENCOUNTER — Emergency Department
Admit: 2020-09-14 | Discharge: 2020-09-14 | Disposition: A | Discharge status: Home / Self Care | Payer: PRIVATE HEALTH INSURANCE

## 2020-09-14 ENCOUNTER — Ambulatory Visit
Admit: 2020-09-14 | Discharge: 2020-09-14 | Disposition: A | Discharge status: Home / Self Care | Payer: PRIVATE HEALTH INSURANCE

## 2020-09-14 DIAGNOSIS — K760 Fatty (change of) liver, not elsewhere classified: Principal | ICD-10-CM

## 2020-09-14 DIAGNOSIS — M25561 Pain in right knee: Principal | ICD-10-CM

## 2020-09-14 DIAGNOSIS — K745 Biliary cirrhosis, unspecified: Principal | ICD-10-CM

## 2020-09-14 DIAGNOSIS — L409 Psoriasis, unspecified: Principal | ICD-10-CM

## 2020-09-14 DIAGNOSIS — R110 Nausea: Principal | ICD-10-CM

## 2020-09-14 DIAGNOSIS — F32A Depression, unspecified: Principal | ICD-10-CM

## 2020-09-14 DIAGNOSIS — S0990XA Unspecified injury of head, initial encounter: Principal | ICD-10-CM

## 2020-09-14 DIAGNOSIS — J449 Chronic obstructive pulmonary disease, unspecified: Principal | ICD-10-CM

## 2020-09-14 DIAGNOSIS — Z87891 Personal history of nicotine dependence: Principal | ICD-10-CM

## 2020-09-14 MED ORDER — LIDOCAINE 4 % TOPICAL PATCH
MEDICATED_PATCH | Freq: Every day | TRANSDERMAL | 0 refills | 10 days | Status: CP
Start: 2020-09-14 — End: 2020-10-02

## 2020-09-15 DIAGNOSIS — M25561 Pain in right knee: Principal | ICD-10-CM

## 2020-09-19 DIAGNOSIS — J441 Chronic obstructive pulmonary disease with (acute) exacerbation: Principal | ICD-10-CM

## 2020-09-19 DIAGNOSIS — F419 Anxiety disorder, unspecified: Principal | ICD-10-CM

## 2020-09-19 MED ORDER — HYDROXYZINE HCL 25 MG TABLET
ORAL_TABLET | 0 refills | 0 days
Start: 2020-09-19 — End: ?

## 2020-09-19 MED ORDER — BENZONATATE 100 MG CAPSULE
ORAL_CAPSULE | 0 refills | 0 days
Start: 2020-09-19 — End: ?

## 2020-09-20 DIAGNOSIS — F419 Anxiety disorder, unspecified: Principal | ICD-10-CM

## 2020-09-20 MED ORDER — HYDROXYZINE HCL 25 MG TABLET
ORAL_TABLET | ORAL | 0 refills | 0.00000 days | Status: CP
Start: 2020-09-20 — End: 2020-10-02

## 2020-09-21 ENCOUNTER — Ambulatory Visit: Admit: 2020-09-21 | Discharge: 2020-09-22 | Payer: PRIVATE HEALTH INSURANCE | Attending: Family | Primary: Family

## 2020-09-21 DIAGNOSIS — R062 Wheezing: Principal | ICD-10-CM

## 2020-09-21 DIAGNOSIS — R059 Cough: Principal | ICD-10-CM

## 2020-09-21 MED ORDER — BENZONATATE 200 MG CAPSULE
ORAL_CAPSULE | Freq: Three times a day (TID) | ORAL | 0 refills | 10.00000 days | Status: CP | PRN
Start: 2020-09-21 — End: 2020-10-02

## 2020-09-21 MED ORDER — PREDNISONE 20 MG TABLET
ORAL_TABLET | ORAL | 0 refills | 7.00000 days | Status: CP
Start: 2020-09-21 — End: 2020-10-02

## 2020-09-21 MED ORDER — IPRATROPIUM 0.5 MG-ALBUTEROL 3 MG (2.5 MG BASE)/3 ML NEBULIZATION SOLN
Freq: Four times a day (QID) | RESPIRATORY_TRACT | 0 refills | 1 days | Status: CP | PRN
Start: 2020-09-21 — End: 2020-10-21

## 2020-09-21 MED ORDER — AZITHROMYCIN 250 MG TABLET
ORAL_TABLET | ORAL | 0 refills | 0.00000 days | Status: CP
Start: 2020-09-21 — End: 2020-10-02

## 2020-10-02 ENCOUNTER — Ambulatory Visit: Admit: 2020-10-02 | Discharge: 2020-10-03 | Payer: PRIVATE HEALTH INSURANCE

## 2020-10-02 DIAGNOSIS — J449 Chronic obstructive pulmonary disease, unspecified: Principal | ICD-10-CM

## 2020-10-02 DIAGNOSIS — K743 Primary biliary cirrhosis: Principal | ICD-10-CM

## 2020-10-02 DIAGNOSIS — G2581 Restless legs syndrome: Principal | ICD-10-CM

## 2020-10-02 DIAGNOSIS — K8309 Other cholangitis: Principal | ICD-10-CM

## 2020-10-02 DIAGNOSIS — F419 Anxiety disorder, unspecified: Principal | ICD-10-CM

## 2020-10-02 DIAGNOSIS — D071 Carcinoma in situ of vulva: Principal | ICD-10-CM

## 2020-10-02 DIAGNOSIS — E559 Vitamin D deficiency, unspecified: Principal | ICD-10-CM

## 2020-10-02 MED ORDER — HYDROXYZINE HCL 25 MG TABLET
ORAL_TABLET | Freq: Four times a day (QID) | ORAL | 4 refills | 23.00000 days | Status: CP | PRN
Start: 2020-10-02 — End: ?

## 2020-10-02 MED ORDER — ALBUTEROL SULFATE 2.5 MG/3 ML (0.083 %) SOLUTION FOR NEBULIZATION
RESPIRATORY_TRACT | 2 refills | 5 days | Status: CP | PRN
Start: 2020-10-02 — End: 2021-10-02

## 2020-10-02 MED ORDER — PRAMIPEXOLE 0.125 MG TABLET
ORAL_TABLET | Freq: Every evening | ORAL | 11 refills | 30 days | Status: CP
Start: 2020-10-02 — End: 2021-10-02

## 2020-10-02 MED ORDER — SPIRONOLACTONE 100 MG TABLET
ORAL_TABLET | ORAL | 3 refills | 0 days | Status: CP
Start: 2020-10-02 — End: 2021-10-02

## 2020-10-02 MED ORDER — SPIRIVA RESPIMAT 2.5 MCG/ACTUATION SOLUTION FOR INHALATION
Freq: Every day | RESPIRATORY_TRACT | 11 refills | 14 days | Status: CP
Start: 2020-10-02 — End: ?

## 2020-10-03 DIAGNOSIS — D071 Carcinoma in situ of vulva: Principal | ICD-10-CM

## 2020-10-04 DIAGNOSIS — J449 Chronic obstructive pulmonary disease, unspecified: Principal | ICD-10-CM

## 2020-10-09 ENCOUNTER — Ambulatory Visit
Admit: 2020-10-09 | Discharge: 2020-10-10 | Payer: PRIVATE HEALTH INSURANCE | Attending: Student in an Organized Health Care Education/Training Program | Primary: Student in an Organized Health Care Education/Training Program

## 2020-10-09 DIAGNOSIS — L409 Psoriasis, unspecified: Principal | ICD-10-CM

## 2020-10-09 DIAGNOSIS — L820 Inflamed seborrheic keratosis: Principal | ICD-10-CM

## 2020-10-09 MED ORDER — KETOCONAZOLE 2 % SHAMPOO
TOPICAL | 5 refills | 0.00000 days | Status: CP
Start: 2020-10-09 — End: ?

## 2020-10-09 MED ORDER — CLOBETASOL-EMOLLIENT 0.05 % TOPICAL FOAM
OPHTHALMIC | 5 refills | 0.00000 days | Status: CP
Start: 2020-10-09 — End: ?

## 2020-10-12 ENCOUNTER — Ambulatory Visit: Admit: 2020-10-12 | Discharge: 2020-10-13 | Payer: PRIVATE HEALTH INSURANCE

## 2020-10-12 DIAGNOSIS — J449 Chronic obstructive pulmonary disease, unspecified: Principal | ICD-10-CM

## 2020-10-12 DIAGNOSIS — N903 Dysplasia of vulva, unspecified: Principal | ICD-10-CM

## 2020-10-12 DIAGNOSIS — F32A Depression, unspecified: Principal | ICD-10-CM

## 2020-10-12 DIAGNOSIS — Z86002 Personal history of in-situ neoplasm of other and unspecified genital organs: Principal | ICD-10-CM

## 2020-10-12 DIAGNOSIS — Z124 Encounter for screening for malignant neoplasm of cervix: Principal | ICD-10-CM

## 2020-10-12 DIAGNOSIS — L409 Psoriasis, unspecified: Principal | ICD-10-CM

## 2020-10-12 DIAGNOSIS — D071 Carcinoma in situ of vulva: Principal | ICD-10-CM

## 2020-10-12 DIAGNOSIS — Z87891 Personal history of nicotine dependence: Principal | ICD-10-CM

## 2020-10-12 DIAGNOSIS — K760 Fatty (change of) liver, not elsewhere classified: Principal | ICD-10-CM

## 2020-10-29 MED ORDER — FLUTICASONE 250 MCG-SALMETEROL 50 MCG/DOSE BLISTR POWDR FOR INHALATION
RESPIRATORY_TRACT | 4 refills | 0 days | Status: CP
Start: 2020-10-29 — End: 2021-10-29

## 2020-12-07 ENCOUNTER — Ambulatory Visit
Admit: 2020-12-07 | Discharge: 2020-12-08 | Payer: PRIVATE HEALTH INSURANCE | Attending: Student in an Organized Health Care Education/Training Program | Primary: Student in an Organized Health Care Education/Training Program

## 2020-12-07 DIAGNOSIS — L219 Seborrheic dermatitis, unspecified: Principal | ICD-10-CM

## 2020-12-07 DIAGNOSIS — D492 Neoplasm of unspecified behavior of bone, soft tissue, and skin: Principal | ICD-10-CM

## 2020-12-07 DIAGNOSIS — L309 Dermatitis, unspecified: Principal | ICD-10-CM

## 2020-12-07 MED ORDER — DESONIDE 0.05 % TOPICAL OINTMENT
Freq: Two times a day (BID) | TOPICAL | 2 refills | 0.00000 days | Status: CP
Start: 2020-12-07 — End: 2020-12-08

## 2020-12-07 MED ORDER — FLUTICASONE PROPIONATE 0.005 % TOPICAL OINTMENT
Freq: Two times a day (BID) | TOPICAL | 2 refills | 0.00000 days | Status: CP
Start: 2020-12-07 — End: ?

## 2020-12-08 ENCOUNTER — Ambulatory Visit: Admit: 2020-12-08 | Discharge: 2020-12-09 | Payer: PRIVATE HEALTH INSURANCE

## 2020-12-08 DIAGNOSIS — J449 Chronic obstructive pulmonary disease, unspecified: Principal | ICD-10-CM

## 2020-12-08 DIAGNOSIS — R739 Hyperglycemia, unspecified: Principal | ICD-10-CM

## 2020-12-08 DIAGNOSIS — K743 Primary biliary cirrhosis: Principal | ICD-10-CM

## 2020-12-08 DIAGNOSIS — M8000XS Age-related osteoporosis with current pathological fracture, unspecified site, sequela: Principal | ICD-10-CM

## 2020-12-08 MED ORDER — VENTOLIN HFA 90 MCG/ACTUATION AEROSOL INHALER
Freq: Four times a day (QID) | RESPIRATORY_TRACT | 11 refills | 0 days | Status: CP | PRN
Start: 2020-12-08 — End: 2021-12-08

## 2020-12-08 MED ORDER — ALBUTEROL SULFATE HFA 90 MCG/ACTUATION AEROSOL INHALER
Freq: Four times a day (QID) | RESPIRATORY_TRACT | 0 refills | 0 days | Status: CP | PRN
Start: 2020-12-08 — End: 2021-12-08

## 2020-12-24 DIAGNOSIS — R21 Rash and other nonspecific skin eruption: Principal | ICD-10-CM

## 2020-12-24 MED ORDER — MUPIROCIN 2 % TOPICAL OINTMENT
1 refills | 0 days | Status: CP
Start: 2020-12-24 — End: ?

## 2020-12-24 MED ORDER — DOXYCYCLINE HYCLATE 100 MG CAPSULE
ORAL_CAPSULE | 0 refills | 0 days | Status: CP
Start: 2020-12-24 — End: ?

## 2020-12-26 ENCOUNTER — Ambulatory Visit: Admit: 2020-12-26 | Discharge: 2020-12-27 | Payer: PRIVATE HEALTH INSURANCE

## 2020-12-26 DIAGNOSIS — T148XXA Other injury of unspecified body region, initial encounter: Principal | ICD-10-CM

## 2021-01-02 MED ORDER — ALBUTEROL SULFATE HFA 90 MCG/ACTUATION AEROSOL INHALER
Freq: Four times a day (QID) | RESPIRATORY_TRACT | 0 refills | 0 days | Status: CP | PRN
Start: 2021-01-02 — End: 2022-01-02

## 2021-01-03 ENCOUNTER — Ambulatory Visit
Admit: 2021-01-03 | Discharge: 2021-01-04 | Payer: PRIVATE HEALTH INSURANCE | Attending: Adult Health | Primary: Adult Health

## 2021-01-03 DIAGNOSIS — U071 SARS-CoV-2 positive: Principal | ICD-10-CM

## 2021-01-03 DIAGNOSIS — J441 Chronic obstructive pulmonary disease with (acute) exacerbation: Principal | ICD-10-CM

## 2021-01-03 DIAGNOSIS — J069 Acute upper respiratory infection, unspecified: Principal | ICD-10-CM

## 2021-01-03 MED ORDER — PREDNISONE 20 MG TABLET
ORAL_TABLET | Freq: Every day | ORAL | 0 refills | 5 days | Status: CP
Start: 2021-01-03 — End: 2021-01-08

## 2021-01-04 ENCOUNTER — Ambulatory Visit: Admit: 2021-01-04 | Discharge: 2021-01-05 | Payer: PRIVATE HEALTH INSURANCE

## 2021-01-04 DIAGNOSIS — U071 SARS-CoV-2 positive: Principal | ICD-10-CM

## 2021-01-10 MED ORDER — BENZONATATE 100 MG CAPSULE
ORAL_CAPSULE | Freq: Three times a day (TID) | ORAL | 1 refills | 10.00000 days | Status: CP | PRN
Start: 2021-01-10 — End: 2022-01-10

## 2021-01-18 ENCOUNTER — Ambulatory Visit
Admit: 2021-01-18 | Discharge: 2021-01-19 | Payer: PRIVATE HEALTH INSURANCE | Attending: Hematology & Oncology | Primary: Hematology & Oncology

## 2021-01-18 DIAGNOSIS — D708 Other neutropenia: Principal | ICD-10-CM

## 2021-01-18 DIAGNOSIS — R5382 Chronic fatigue, unspecified: Principal | ICD-10-CM

## 2021-01-18 DIAGNOSIS — K743 Primary biliary cirrhosis: Principal | ICD-10-CM

## 2021-01-30 DIAGNOSIS — G2581 Restless legs syndrome: Principal | ICD-10-CM

## 2021-01-30 DIAGNOSIS — G4733 Obstructive sleep apnea (adult) (pediatric): Principal | ICD-10-CM

## 2021-01-30 MED ORDER — FLUTICASONE PROPIONATE 50 MCG/ACTUATION NASAL SPRAY,SUSPENSION
0 refills | 0 days
Start: 2021-01-30 — End: ?

## 2021-02-02 MED ORDER — FLUTICASONE PROPIONATE 50 MCG/ACTUATION NASAL SPRAY,SUSPENSION
4 refills | 0 days | Status: CP
Start: 2021-02-02 — End: ?

## 2021-03-13 ENCOUNTER — Ambulatory Visit: Admit: 2021-03-13 | Discharge: 2021-03-14 | Payer: PRIVATE HEALTH INSURANCE

## 2021-03-27 ENCOUNTER — Ambulatory Visit: Admit: 2021-03-27 | Discharge: 2021-03-28 | Payer: PRIVATE HEALTH INSURANCE

## 2021-04-03 ENCOUNTER — Ambulatory Visit: Admit: 2021-04-03 | Discharge: 2021-04-04 | Payer: PRIVATE HEALTH INSURANCE

## 2021-04-03 DIAGNOSIS — L409 Psoriasis, unspecified: Principal | ICD-10-CM

## 2021-04-03 MED ORDER — CLOBETASOL 0.05 % SCALP SOLUTION
OPHTHALMIC | 5 refills | 0.00000 days | Status: CP
Start: 2021-04-03 — End: ?

## 2021-04-04 DIAGNOSIS — G4733 Obstructive sleep apnea (adult) (pediatric): Principal | ICD-10-CM

## 2021-04-04 DIAGNOSIS — G2581 Restless legs syndrome: Principal | ICD-10-CM

## 2021-04-04 MED ORDER — FLUTICASONE PROPIONATE 50 MCG/ACTUATION NASAL SPRAY,SUSPENSION
Freq: Two times a day (BID) | NASAL | 11 refills | 0 days | Status: CP
Start: 2021-04-04 — End: 2021-05-04

## 2021-05-23 ENCOUNTER — Ambulatory Visit: Admit: 2021-05-23 | Discharge: 2021-05-25 | Payer: PRIVATE HEALTH INSURANCE

## 2021-06-14 ENCOUNTER — Ambulatory Visit: Admit: 2021-06-14 | Discharge: 2021-06-15 | Payer: PRIVATE HEALTH INSURANCE

## 2021-06-20 MED ORDER — URSODIOL 300 MG CAPSULE
ORAL_CAPSULE | 0 refills | 0 days
Start: 2021-06-20 — End: ?

## 2021-06-21 MED ORDER — URSODIOL 300 MG CAPSULE
ORAL_CAPSULE | Freq: Two times a day (BID) | ORAL | 0 refills | 30 days | Status: CP
Start: 2021-06-21 — End: 2021-07-21

## 2021-06-25 DIAGNOSIS — F419 Anxiety disorder, unspecified: Principal | ICD-10-CM

## 2021-06-25 MED ORDER — HYDROXYZINE HCL 25 MG TABLET
ORAL_TABLET | 2 refills | 0 days | Status: CP
Start: 2021-06-25 — End: ?

## 2021-06-26 MED ORDER — URSODIOL 300 MG CAPSULE
ORAL_CAPSULE | 0 refills | 0 days
Start: 2021-06-26 — End: ?

## 2021-06-29 ENCOUNTER — Ambulatory Visit: Admit: 2021-06-29 | Discharge: 2021-06-30 | Payer: PRIVATE HEALTH INSURANCE

## 2021-07-17 ENCOUNTER — Encounter: Payer: Medicaid Other | Attending: Pulmonary Disease | Admitting: *Deleted

## 2021-07-17 ENCOUNTER — Encounter: Payer: Self-pay | Admitting: *Deleted

## 2021-07-17 ENCOUNTER — Other Ambulatory Visit: Payer: Self-pay

## 2021-07-17 DIAGNOSIS — J449 Chronic obstructive pulmonary disease, unspecified: Secondary | ICD-10-CM | POA: Insufficient documentation

## 2021-07-17 NOTE — Progress Notes (Signed)
Virtual orientation call completed today. shehas an appointment on Date: 07/24/2021  for EP eval and gym Orientation.  Documentation of diagnosis can be found in [facility] Date: 06/14/2021 .

## 2021-07-24 ENCOUNTER — Other Ambulatory Visit: Payer: Self-pay

## 2021-07-24 VITALS — Ht 66.1 in | Wt 176.6 lb

## 2021-07-24 DIAGNOSIS — J449 Chronic obstructive pulmonary disease, unspecified: Secondary | ICD-10-CM

## 2021-07-24 NOTE — Patient Instructions (Signed)
Patient Instructions  Patient Details  Name: Bailey Maldonado MRN: [id number] Date of Birth: 1961/04/26 Referring Provider:  Denny Levy, MD  Below are your personal goals for exercise, nutrition, and risk factors. Our goal is to help you stay on track towards obtaining and maintaining these goals. We will be discussing your progress on these goals with you throughout the program.  Initial Exercise Prescription:  Initial Exercise Prescription - 07/24/21 1000       Date of Initial Exercise RX and Referring Provider   Date 07/24/21    Referring Provider Dyke Brackett MD      Treadmill   MPH 2.6    Grade 0.5    Minutes 15    METs 3.17      Recumbant Bike   Level 3    RPM 60    Watts 30    Minutes 15    METs 3.4      NuStep   Level 3    SPM 80    Minutes 15    METs 3.4      REL-XR   Level 2    Speed 50    Minutes 15    METs 3.4      Prescription Details   Frequency (times per week) 3    Duration Progress to 30 minutes of continuous aerobic without signs/symptoms of physical distress      Intensity   THRR 40-80% of Max Heartrate 113-146    Ratings of Perceived Exertion 11-13    Perceived Dyspnea 0-4      Progression   Progression Continue to progress workloads to maintain intensity without signs/symptoms of physical distress.      Resistance Training   Training Prescription Yes    Weight 3 lb    Reps 10-15             Exercise Goals: Frequency: Be able to perform aerobic exercise two to three times per week in program working toward 2-5 days per week of home exercise.  Intensity: Work with a perceived exertion of 11 (fairly light) - 15 (hard) while following your exercise prescription.  We will make changes to your prescription with you as you progress through the program.   Duration: Be able to do 30 to 45 minutes of continuous aerobic exercise in addition to a 5 minute warm-up and a 5 minute cool-down routine.   Nutrition Goals: Your  personal nutrition goals will be established when you do your nutrition analysis with the dietician.  The following are general nutrition guidelines to follow: Cholesterol < 200mg /day Sodium < 1500mg /day Fiber: Women over 50 yrs - 21 grams per day  Personal Goals:  Personal Goals and Risk Factors at Admission - 07/24/21 1009       Core Components/Risk Factors/Patient Goals on Admission    Weight Management Yes;Weight Loss    Intervention Weight Management: Develop a combined nutrition and exercise program designed to reach desired caloric intake, while maintaining appropriate intake of nutrient and fiber, sodium and fats, and appropriate energy expenditure required for the weight goal.;Weight Management: Provide education and appropriate resources to help participant work on and attain dietary goals.;Weight Management/Obesity: Establish reasonable short term and long term weight goals.    Admit Weight 176 lb (79.8 kg)    Goal Weight: Short Term 170 lb (77.1 kg)    Goal Weight: Long Term 160 lb (72.6 kg)    Expected Outcomes Short Term: Continue to assess and modify interventions until short term weight  is achieved;Long Term: Adherence to nutrition and physical activity/exercise program aimed toward attainment of established weight goal;Weight Loss: Understanding of general recommendations for a balanced deficit meal plan, which promotes 1-2 lb weight loss per week and includes a negative energy balance of [phone] kcal/d;Understanding recommendations for meals to include 15-35% energy as protein, 25-35% energy from fat, 35-60% energy from carbohydrates, less than 200mg  of dietary cholesterol, 20-35 gm of total fiber daily;Understanding of distribution of calorie intake throughout the day with the consumption of 4-5 meals/snacks    Improve shortness of breath with ADL's Yes    Intervention Provide education, individualized exercise plan and daily activity instruction to help decrease symptoms of  SOB with activities of daily living.    Expected Outcomes Short Term: Improve cardiorespiratory fitness to achieve a reduction of symptoms when performing ADLs;Long Term: Be able to perform more ADLs without symptoms or delay the onset of symptoms    Increase knowledge of respiratory medications and ability to use respiratory devices properly  Yes    Intervention Provide education and demonstration as needed of appropriate use of medications, inhalers, and oxygen therapy.    Expected Outcomes Short Term: Achieves understanding of medications use. Understands that oxygen is a medication prescribed by physician. Demonstrates appropriate use of inhaler and oxygen therapy.;Long Term: Maintain appropriate use of medications, inhalers, and oxygen therapy.             Tobacco Use Initial Evaluation: Social History   Tobacco Use  Smoking Status Former   Packs/day: 1.50   Years: 35.00   Pack years: 52.50   Types: Cigarettes   Quit date: 11/05/2015   Years since quitting: 5.7  Smokeless Tobacco Never    Exercise Goals and Review:  Exercise Goals     Row Name 07/24/21 1009             Exercise Goals   Increase Physical Activity Yes       Intervention Provide advice, education, support and counseling about physical activity/exercise needs.;Develop an individualized exercise prescription for aerobic and resistive training based on initial evaluation findings, risk stratification, comorbidities and participant's personal goals.       Expected Outcomes Short Term: Attend rehab on a regular basis to increase amount of physical activity.;Long Term: Add in home exercise to make exercise part of routine and to increase amount of physical activity.;Long Term: Exercising regularly at least 3-5 days a week.       Increase Strength and Stamina Yes       Intervention Develop an individualized exercise prescription for aerobic and resistive training based on initial evaluation findings, risk  stratification, comorbidities and participant's personal goals.;Provide advice, education, support and counseling about physical activity/exercise needs.       Expected Outcomes Short Term: Increase workloads from initial exercise prescription for resistance, speed, and METs.;Short Term: Perform resistance training exercises routinely during rehab and add in resistance training at home;Long Term: Improve cardiorespiratory fitness, muscular endurance and strength as measured by increased METs and functional capacity (07/26/21)       Able to understand and use rate of perceived exertion (RPE) scale Yes       Intervention Provide education and explanation on how to use RPE scale       Expected Outcomes Short Term: Able to use RPE daily in rehab to express subjective intensity level;Long Term:  Able to use RPE to guide intensity level when exercising independently       Able to understand and use Dyspnea  scale Yes       Intervention Provide education and explanation on how to use Dyspnea scale       Expected Outcomes Short Term: Able to use Dyspnea scale daily in rehab to express subjective sense of shortness of breath during exertion;Long Term: Able to use Dyspnea scale to guide intensity level when exercising independently       Knowledge and understanding of Target Heart Rate Range (THRR) Yes       Intervention Provide education and explanation of THRR including how the numbers were predicted and where they are located for reference       Expected Outcomes Short Term: Able to state/look up THRR;Long Term: Able to use THRR to govern intensity when exercising independently;Short Term: Able to use daily as guideline for intensity in rehab       Able to check pulse independently Yes       Intervention Provide education and demonstration on how to check pulse in carotid and radial arteries.;Review the importance of being able to check your own pulse for safety during independent exercise       Expected Outcomes  Short Term: Able to explain why pulse checking is important during independent exercise;Long Term: Able to check pulse independently and accurately       Understanding of Exercise Prescription Yes       Intervention Provide education, explanation, and written materials on patient's individual exercise prescription       Expected Outcomes Short Term: Able to explain program exercise prescription;Long Term: Able to explain home exercise prescription to exercise independently                Copy of goals given to participant.

## 2021-07-24 NOTE — Progress Notes (Signed)
Pulmonary Individual Treatment Plan  Patient Details  Name: Bailey Maldonado MRN: [id number] Date of Birth: 07-Sep-1961 Referring Provider:   Flowsheet Row Pulmonary Rehab from 07/24/2021 in [facility] Cardiac and Pulmonary Rehab  Referring Provider Dyke Brackett MD       Initial Encounter Date:  Flowsheet Row Pulmonary Rehab from 07/24/2021 in [facility] Cardiac and Pulmonary Rehab  Date 07/24/21       Visit Diagnosis: Chronic obstructive pulmonary disease, unspecified COPD type (HCC)  Patient's Home Medications on Admission:  Current Outpatient Medications:    albuterol (PROVENTIL) (2.5 MG/3ML) 0.083% nebulizer solution, Inhale into the lungs., Disp: , Rfl:    albuterol (VENTOLIN HFA) 108 (90 Base) MCG/ACT inhaler, Inhale into the lungs., Disp: , Rfl:    clobetasol (TEMOVATE) 0.05 % external solution, Apply topically 2 (two) times daily as needed., Disp: , Rfl:    fluticasone (FLONASE) 50 MCG/ACT nasal spray, 2 sprays into each nostril two (2) times a day., Disp: , Rfl:    fluticasone-salmeterol (ADVAIR) 250-50 MCG/ACT AEPB, Inhale into the lungs., Disp: , Rfl:    hydrOXYzine (ATARAX/VISTARIL) 25 MG tablet, Take 25 mg by mouth in the morning and at bedtime., Disp: , Rfl:    Multiple Vitamin (MULTIVITAMIN) tablet, Take 1 tablet by mouth daily., Disp: , Rfl:    pramipexole (MIRAPEX) 0.125 MG tablet, Take by mouth., Disp: , Rfl:    spironolactone (ALDACTONE) 100 MG tablet, Take 100 mg by mouth daily., Disp: , Rfl:    Tiotropium Bromide Monohydrate (SPIRIVA RESPIMAT) 2.5 MCG/ACT AERS, Inhale into the lungs., Disp: , Rfl:    ursodiol (ACTIGALL) 300 MG capsule, Take by mouth., Disp: , Rfl:   Past Medical History: No past medical history on file.  Tobacco Use: Social History   Tobacco Use  Smoking Status Former   Packs/day: 1.50   Years: 35.00   Pack years: 52.50   Types: Cigarettes   Quit date: 11/05/2015   Years since quitting: 5.7  Smokeless Tobacco Never    Labs: Recent Review  Flowsheet Data   There is no flowsheet data to display.      Pulmonary Assessment Scores:  Pulmonary Assessment Scores     Row Name 07/24/21 [phone]         ADL UCSD   ADL Phase Entry     SOB Score total 21     Rest 0     Walk 1     Stairs 5     Bath 0     Dress 0     Shop 1           CAT Score   CAT Score 11              UCSD: Self-administered rating of dyspnea associated with activities of daily living (ADLs) 6-point scale (0 = "not at all" to 5 = "maximal or unable to do because of breathlessness")  Scoring Scores range from 0 to 120.  Minimally important difference is 5 units  CAT: CAT can identify the health impairment of COPD patients and is better correlated with disease progression.  CAT has a scoring range of zero to 40. The CAT score is classified into four groups of low (less than 10), medium (10 - 20), high (21-30) and very high (31-40) based on the impact level of disease on health status. A CAT score over 10 suggests significant symptoms.  A worsening CAT score could be explained by an exacerbation, poor medication adherence, poor inhaler technique, or progression of  COPD or comorbid conditions.  CAT MCID is 2 points  mMRC: mMRC (Modified Medical Research Council) Dyspnea Scale is used to assess the degree of baseline functional disability in patients of respiratory disease due to dyspnea. No minimal important difference is established. A decrease in score of 1 point or greater is considered a positive change.   Pulmonary Function Assessment:   Exercise Target Goals: Exercise Program Goal: Individual exercise prescription set using results from initial 6 min walk test and THRR while considering  patient's activity barriers and safety.   Exercise Prescription Goal: Initial exercise prescription builds to 30-45 minutes a day of aerobic activity, 2-3 days per week.  Home exercise guidelines will be given to patient during program as part of exercise  prescription that the participant will acknowledge.  Education: Aerobic Exercise: - Group verbal and visual presentation on the components of exercise prescription. Introduces F.I.T.T principle from ACSM for exercise prescriptions.  Reviews F.I.T.T. principles of aerobic exercise including progression. Written material given at graduation.   Education: Resistance Exercise: - Group verbal and visual presentation on the components of exercise prescription. Introduces F.I.T.T principle from ACSM for exercise prescriptions  Reviews F.I.T.T. principles of resistance exercise including progression. Written material given at graduation.    Education: Exercise & Equipment Safety: - Individual verbal instruction and demonstration of equipment use and safety with use of the equipment. Flowsheet Row Pulmonary Rehab from 07/24/2021 in [facility] Cardiac and Pulmonary Rehab  Education need identified 07/24/21  Date 07/24/21  Educator KL  Instruction Review Code 1- Verbalizes Understanding       Education: Exercise Physiology & General Exercise Guidelines: - Group verbal and written instruction with models to review the exercise physiology of the cardiovascular system and associated critical values. Provides general exercise guidelines with specific guidelines to those with heart or lung disease.  Flowsheet Row Pulmonary Rehab from 07/24/2021 in [facility] Cardiac and Pulmonary Rehab  Education need identified 07/24/21       Education: Flexibility, Balance, Mind/Body Relaxation: - Group verbal and visual presentation with interactive activity on the components of exercise prescription. Introduces F.I.T.T principle from ACSM for exercise prescriptions. Reviews F.I.T.T. principles of flexibility and balance exercise training including progression. Also discusses the mind body connection.  Reviews various relaxation techniques to help reduce and manage stress (i.e. Deep breathing, progressive muscle relaxation, and  visualization). Balance handout provided to take home. Written material given at graduation.   Activity Barriers & Risk Stratification:  Activity Barriers & Cardiac Risk Stratification - 07/24/21 0955       Activity Barriers & Cardiac Risk Stratification   Activity Barriers Shortness of Breath;Deconditioning             6 Minute Walk:  6 Minute Walk     Row Name 07/24/21 0954         6 Minute Walk   Phase Initial     Distance 1300 feet     Walk Time 6 minutes     # of Rest Breaks 0     MPH 2.46     METS 3.44     RPE 7     Perceived Dyspnea  1     VO2 Peak 12.06     Symptoms Yes (comment)     Comments Slight SOB     Resting HR 80 bpm     Resting BP 102/62     Resting Oxygen Saturation  97 %     Exercise Oxygen Saturation  during 6 min walk  89 %     Max Ex. HR 106 bpm     Max Ex. BP 112/66     2 Minute Post BP 100/62           Interval HR   1 Minute HR 97     2 Minute HR 102     3 Minute HR 103     4 Minute HR 105     5 Minute HR 106     6 Minute HR 106     2 Minute Post HR 86     Interval Heart Rate? Yes           Interval Oxygen   Interval Oxygen? Yes     Baseline Oxygen Saturation % 97 %     1 Minute Oxygen Saturation % 94 %     1 Minute Liters of Oxygen 0 L  RA     2 Minute Oxygen Saturation % 93 %     2 Minute Liters of Oxygen 0 L     3 Minute Oxygen Saturation % 91 %     3 Minute Liters of Oxygen 0 L     4 Minute Oxygen Saturation % 89 %     4 Minute Liters of Oxygen 0 L     5 Minute Oxygen Saturation % 90 %     5 Minute Liters of Oxygen 0 L     6 Minute Oxygen Saturation % 90 %     6 Minute Liters of Oxygen 0 L     2 Minute Post Oxygen Saturation % 93 %     2 Minute Post Liters of Oxygen 0 L             Oxygen Initial Assessment:  Oxygen Initial Assessment - 07/24/21 0938       Home Oxygen   Home Oxygen Device None    Sleep Oxygen Prescription None    Home Exercise Oxygen Prescription None    Home Resting Oxygen Prescription  None      Initial 6 min Walk   Oxygen Used None      Program Oxygen Prescription   Program Oxygen Prescription None      Intervention   Short Term Goals To learn and demonstrate proper pursed lip breathing techniques or other breathing techniques. ;To learn and demonstrate proper use of respiratory medications;To learn and understand importance of maintaining oxygen saturations>88%;To learn and understand importance of monitoring SPO2 with pulse oximeter and demonstrate accurate use of the pulse oximeter.;To learn and exhibit compliance with exercise, home and travel O2 prescription    Long  Term Goals Exhibits proper breathing techniques, such as pursed lip breathing or other method taught during program session;Compliance with respiratory medication;Demonstrates proper use of MDI's;Maintenance of O2 saturations>88%;Verbalizes importance of monitoring SPO2 with pulse oximeter and return demonstration;Exhibits compliance with exercise, home  and travel O2 prescription             Oxygen Re-Evaluation:   Oxygen Discharge (Final Oxygen Re-Evaluation):   Initial Exercise Prescription:  Initial Exercise Prescription - 07/24/21 1000       Date of Initial Exercise RX and Referring Provider   Date 07/24/21    Referring Provider Dyke Brackett MD      Treadmill   MPH 2.6    Grade 0.5    Minutes 15    METs 3.17      Recumbant Bike   Level 3    RPM 60  Watts 30    Minutes 15    METs 3.4      NuStep   Level 3    SPM 80    Minutes 15    METs 3.4      REL-XR   Level 2    Speed 50    Minutes 15    METs 3.4      Prescription Details   Frequency (times per week) 3    Duration Progress to 30 minutes of continuous aerobic without signs/symptoms of physical distress      Intensity   THRR 40-80% of Max Heartrate 113-146    Ratings of Perceived Exertion 11-13    Perceived Dyspnea 0-4      Progression   Progression Continue to progress workloads to maintain intensity  without signs/symptoms of physical distress.      Resistance Training   Training Prescription Yes    Weight 3 lb    Reps 10-15             Perform Capillary Blood Glucose checks as needed.  Exercise Prescription Changes:   Exercise Prescription Changes     Row Name 07/24/21 1000             Response to Exercise   Blood Pressure (Admit) 102/62       Blood Pressure (Exercise) 112/66       Blood Pressure (Exit) 100/62       Heart Rate (Admit) 80 bpm       Heart Rate (Exercise) 106 bpm       Heart Rate (Exit) 94 bpm       Oxygen Saturation (Admit) 97 %       Oxygen Saturation (Exercise) 89 %       Oxygen Saturation (Exit) 96 %       Rating of Perceived Exertion (Exercise) 7       Perceived Dyspnea (Exercise) 1       Symptoms Slight SOB       Comments walk test results                Exercise Comments:   Exercise Goals and Review:   Exercise Goals     Row Name 07/24/21 1009             Exercise Goals   Increase Physical Activity Yes       Intervention Provide advice, education, support and counseling about physical activity/exercise needs.;Develop an individualized exercise prescription for aerobic and resistive training based on initial evaluation findings, risk stratification, comorbidities and participant's personal goals.       Expected Outcomes Short Term: Attend rehab on a regular basis to increase amount of physical activity.;Long Term: Add in home exercise to make exercise part of routine and to increase amount of physical activity.;Long Term: Exercising regularly at least 3-5 days a week.       Increase Strength and Stamina Yes       Intervention Develop an individualized exercise prescription for aerobic and resistive training based on initial evaluation findings, risk stratification, comorbidities and participant's personal goals.;Provide advice, education, support and counseling about physical activity/exercise needs.       Expected Outcomes  Short Term: Increase workloads from initial exercise prescription for resistance, speed, and METs.;Short Term: Perform resistance training exercises routinely during rehab and add in resistance training at home;Long Term: Improve cardiorespiratory fitness, muscular endurance and strength as measured by increased METs and functional capacity ( )  Able to understand and use rate of perceived exertion (RPE) scale Yes       Intervention Provide education and explanation on how to use RPE scale       Expected Outcomes Short Term: Able to use RPE daily in rehab to express subjective intensity level;Long Term:  Able to use RPE to guide intensity level when exercising independently       Able to understand and use Dyspnea scale Yes       Intervention Provide education and explanation on how to use Dyspnea scale       Expected Outcomes Short Term: Able to use Dyspnea scale daily in rehab to express subjective sense of shortness of breath during exertion;Long Term: Able to use Dyspnea scale to guide intensity level when exercising independently       Knowledge and understanding of Target Heart Rate Range (THRR) Yes       Intervention Provide education and explanation of THRR including how the numbers were predicted and where they are located for reference       Expected Outcomes Short Term: Able to state/look up THRR;Long Term: Able to use THRR to govern intensity when exercising independently;Short Term: Able to use daily as guideline for intensity in rehab       Able to check pulse independently Yes       Intervention Provide education and demonstration on how to check pulse in carotid and radial arteries.;Review the importance of being able to check your own pulse for safety during independent exercise       Expected Outcomes Short Term: Able to explain why pulse checking is important during independent exercise;Long Term: Able to check pulse independently and accurately       Understanding of Exercise  Prescription Yes       Intervention Provide education, explanation, and written materials on patient's individual exercise prescription       Expected Outcomes Short Term: Able to explain program exercise prescription;Long Term: Able to explain home exercise prescription to exercise independently                Exercise Goals Re-Evaluation :   Discharge Exercise Prescription (Final Exercise Prescription Changes):  Exercise Prescription Changes - 07/24/21 1000       Response to Exercise   Blood Pressure (Admit) 102/62    Blood Pressure (Exercise) 112/66    Blood Pressure (Exit) 100/62    Heart Rate (Admit) 80 bpm    Heart Rate (Exercise) 106 bpm    Heart Rate (Exit) 94 bpm    Oxygen Saturation (Admit) 97 %    Oxygen Saturation (Exercise) 89 %    Oxygen Saturation (Exit) 96 %    Rating of Perceived Exertion (Exercise) 7    Perceived Dyspnea (Exercise) 1    Symptoms Slight SOB    Comments walk test results             Nutrition:  Target Goals: Understanding of nutrition guidelines, daily intake of sodium 1500mg , cholesterol 200mg , calories 30% from fat and 7% or less from saturated fats, daily to have 5 or more servings of fruits and vegetables.  Education: All About Nutrition: -Group instruction provided by verbal, written material, interactive activities, discussions, models, and posters to present general guidelines for heart healthy nutrition including fat, fiber, MyPlate, the role of sodium in heart healthy nutrition, utilization of the nutrition label, and utilization of this knowledge for meal planning. Follow up email sent as well. Written material given at graduation.  Flowsheet Row Pulmonary Rehab from 07/24/2021 in [facility] Cardiac and Pulmonary Rehab  Education need identified 07/24/21       Biometrics:  Pre Biometrics - 07/24/21 0943       Pre Biometrics   Height 5' 6.1" (1.679 m)    Weight 176 lb 9.6 oz (80.1 kg)    BMI (Calculated) 28.42    Single Leg  Stand 9.31 seconds              Nutrition Therapy Plan and Nutrition Goals:  Nutrition Therapy & Goals - 07/24/21 0940       Intervention Plan   Intervention Prescribe, educate and counsel regarding individualized specific dietary modifications aiming towards targeted core components such as weight, hypertension, lipid management, diabetes, heart failure and other comorbidities.    Expected Outcomes Short Term Goal: Understand basic principles of dietary content, such as calories, fat, sodium, cholesterol and nutrients.;Short Term Goal: A plan has been developed with personal nutrition goals set during dietitian appointment.;Long Term Goal: Adherence to prescribed nutrition plan.             Nutrition Assessments:  MEDIFICTS Score Key: ?70 Need to make dietary changes  40-70 Heart Healthy Diet ? 40 Therapeutic Level Cholesterol Diet  Flowsheet Row Pulmonary Rehab from 07/24/2021 in [facility] Cardiac and Pulmonary Rehab  Picture Your Plate Total Score on Admission 59      Picture Your Plate Scores: <63 Unhealthy dietary pattern with much room for improvement. 41-50 Dietary pattern unlikely to meet recommendations for good health and room for improvement. 51-60 More healthful dietary pattern, with some room for improvement.  >60 Healthy dietary pattern, although there may be some specific behaviors that could be improved.   Nutrition Goals Re-Evaluation:   Nutrition Goals Discharge (Final Nutrition Goals Re-Evaluation):   Psychosocial: Target Goals: Acknowledge presence or absence of significant depression and/or stress, maximize coping skills, provide positive support system. Participant is able to verbalize types and ability to use techniques and skills needed for reducing stress and depression.   Education: Stress, Anxiety, and Depression - Group verbal and visual presentation to define topics covered.  Reviews how body is impacted by stress, anxiety, and depression.   Also discusses healthy ways to reduce stress and to treat/manage anxiety and depression.  Written material given at graduation.   Education: Sleep Hygiene -Provides group verbal and written instruction about how sleep can affect your health.  Define sleep hygiene, discuss sleep cycles and impact of sleep habits. Review good sleep hygiene tips.    Initial Review & Psychosocial Screening:  Initial Psych Review & Screening - 07/17/21 1139       Initial Review   Current issues with Current Stress Concerns    Source of Stress Concerns Family    Comments Husband  demntia and in facility      Family Dynamics   Good Support System? Yes   daughter   close friend     Barriers   Psychosocial barriers to participate in program There are no identifiable barriers or psychosocial needs.;The patient should benefit from training in stress management and relaxation.      Screening Interventions   Interventions Encouraged to exercise;To provide support and resources with identified psychosocial needs;Provide feedback about the scores to participant    Expected Outcomes Short Term goal: Utilizing psychosocial counselor, staff and physician to assist with identification of specific Stressors or current issues interfering with healing process. Setting desired goal for each stressor or current issue identified.;Long Term Goal:  Stressors or current issues are controlled or eliminated.;Short Term goal: Identification and review with participant of any Quality of Life or Depression concerns found by scoring the questionnaire.;Long Term goal: The participant improves quality of Life and PHQ9 Scores as seen by post scores and/or verbalization of changes             Quality of Life Scores:  Scores of 19 and below usually indicate a poorer quality of life in these areas.  A difference of  2-3 points is a clinically meaningful difference.  A difference of 2-3 points in the total score of the Quality of Life Index  has been associated with significant improvement in overall quality of life, self-image, physical symptoms, and general health in studies assessing change in quality of life.  PHQ-9: Recent Review Flowsheet Data     Depression screen [facility] 2/9 07/24/2021   Decreased Interest 0   Down, Depressed, Hopeless 0   PHQ - 2 Score 0   Altered sleeping 0   Tired, decreased energy 1   Change in appetite 2   Feeling bad or failure about yourself  0   Trouble concentrating 0   Moving slowly or fidgety/restless 0   Suicidal thoughts 0   PHQ-9 Score 3   Difficult doing work/chores Not difficult at all      Interpretation of Total Score  Total Score Depression Severity:  1-4 = Minimal depression, 5-9 = Mild depression, 10-14 = Moderate depression, 15-19 = Moderately severe depression, 20-27 = Severe depression   Psychosocial Evaluation and Intervention:  Psychosocial Evaluation - 07/17/21 1145       Psychosocial Evaluation & Interventions   Interventions Encouraged to exercise with the program and follow exercise prescription;Relaxation education;Stress management education    Comments Bailey Maldonado has no barriers to attending the program. She has support system of her daughter and a close friend. She does have significant stress as her husband has dementia and lives in a facility in [place]. She goes to see him daily. She has gained weight since quitting cigarettes in2017. She quit because she wanted to be here for her grandchild that was born on her birthday.  She wants to get back to her weight before she quit smoking.  She is ready to get started.    Expected Outcomes STG: Bailey Maldonado will be able to attend all scheduled sessions, she will gain knowledge to help her reduce her stress. LTG: Bailey Maldonado will continue to use what she learned in the program to help with her stress.    Continue Psychosocial Services  Follow up required by staff             Psychosocial Re-Evaluation:   Psychosocial  Discharge (Final Psychosocial Re-Evaluation):   Education: Education Goals: Education classes will be provided on a weekly basis, covering required topics. Participant will state understanding/return demonstration of topics presented.  Learning Barriers/Preferences:   General Pulmonary Education Topics:  Infection Prevention: - Provides verbal and written material to individual with discussion of infection control including proper hand washing and proper equipment cleaning during exercise session. Flowsheet Row Pulmonary Rehab from 07/24/2021 in [facility] Cardiac and Pulmonary Rehab  Education need identified 07/24/21  Date 07/24/21  Educator KL  Instruction Review Code 1- Verbalizes Understanding       Falls Prevention: - Provides verbal and written material to individual with discussion of falls prevention and safety. Flowsheet Row Pulmonary Rehab from 07/24/2021 in [facility] Cardiac and Pulmonary Rehab  Education need identified 07/24/21  Date 07/24/21  Educator KL  Instruction Review Code 1- Verbalizes Understanding       Chronic Lung Disease Review: - Group verbal instruction with posters, models, PowerPoint presentations and videos,  to review new updates, new respiratory medications, new advancements in procedures and treatments. Providing information on websites and "800" numbers for continued self-education. Includes information about supplement oxygen, available portable oxygen systems, continuous and intermittent flow rates, oxygen safety, concentrators, and Medicare reimbursement for oxygen. Explanation of Pulmonary Drugs, including class, frequency, complications, importance of spacers, rinsing mouth after steroid MDI's, and proper cleaning methods for nebulizers. Review of basic lung anatomy and physiology related to function, structure, and complications of lung disease. Review of risk factors. Discussion about methods for diagnosing sleep apnea and types of masks and machines  for OSA. Includes a review of the use of types of environmental controls: home humidity, furnaces, filters, dust mite/pet prevention, HEPA vacuums. Discussion about weather changes, air quality and the benefits of nasal washing. Instruction on Warning signs, infection symptoms, calling MD promptly, preventive modes, and value of vaccinations. Review of effective airway clearance, coughing and/or vibration techniques. Emphasizing that all should Create an Action Plan. Written material given at graduation. Flowsheet Row Pulmonary Rehab from 07/24/2021 in [facility] Cardiac and Pulmonary Rehab  Education need identified 07/24/21       AED/CPR: - Group verbal and written instruction with the use of models to demonstrate the basic use of the AED with the basic ABC's of resuscitation.    Anatomy and Cardiac Procedures: - Group verbal and visual presentation and models provide information about basic cardiac anatomy and function. Reviews the testing methods done to diagnose heart disease and the outcomes of the test results. Describes the treatment choices: Medical Management, Angioplasty, or Coronary Bypass Surgery for treating various heart conditions including Myocardial Infarction, Angina, Valve Disease, and Cardiac Arrhythmias.  Written material given at graduation.   Medication Safety: - Group verbal and visual instruction to review commonly prescribed medications for heart and lung disease. Reviews the medication, class of the drug, and side effects. Includes the steps to properly store meds and maintain the prescription regimen.  Written material given at graduation.   Other: -Provides group and verbal instruction on various topics (see comments)   Knowledge Questionnaire Score:  Knowledge Questionnaire Score - 07/24/21 0942       Knowledge Questionnaire Score   Pre Score 13/18: HR, ADL, Nutrition, O2              Core Components/Risk Factors/Patient Goals at Admission:  Personal  Goals and Risk Factors at Admission - 07/24/21 1009       Core Components/Risk Factors/Patient Goals on Admission    Weight Management Yes;Weight Loss    Intervention Weight Management: Develop a combined nutrition and exercise program designed to reach desired caloric intake, while maintaining appropriate intake of nutrient and fiber, sodium and fats, and appropriate energy expenditure required for the weight goal.;Weight Management: Provide education and appropriate resources to help participant work on and attain dietary goals.;Weight Management/Obesity: Establish reasonable short term and long term weight goals.    Admit Weight 176 lb (79.8 kg)    Goal Weight: Short Term 170 lb (77.1 kg)    Goal Weight: Long Term 160 lb (72.6 kg)    Expected Outcomes Short Term: Continue to assess and modify interventions until short term weight is achieved;Long Term: Adherence to nutrition and physical activity/exercise program aimed toward attainment of established weight goal;Weight Loss: Understanding of general recommendations for a balanced  deficit meal plan, which promotes 1-2 lb weight loss per week and includes a negative energy balance of [phone] kcal/d;Understanding recommendations for meals to include 15-35% energy as protein, 25-35% energy from fat, 35-60% energy from carbohydrates, less than  of dietary cholesterol, 20-35 gm of total fiber daily;Understanding of distribution of calorie intake throughout the day with the consumption of 4-5 meals/snacks    Improve shortness of breath with ADL's Yes    Intervention Provide education, individualized exercise plan and daily activity instruction to help decrease symptoms of SOB with activities of daily living.    Expected Outcomes Short Term: Improve cardiorespiratory fitness to achieve a reduction of symptoms when performing ADLs;Long Term: Be able to perform more ADLs without symptoms or delay the onset of symptoms    Increase knowledge of  respiratory medications and ability to use respiratory devices properly  Yes    Intervention Provide education and demonstration as needed of appropriate use of medications, inhalers, and oxygen therapy.    Expected Outcomes Short Term: Achieves understanding of medications use. Understands that oxygen is a medication prescribed by physician. Demonstrates appropriate use of inhaler and oxygen therapy.;Long Term: Maintain appropriate use of medications, inhalers, and oxygen therapy.             Education:Diabetes - Individual verbal and written instruction to review signs/symptoms of diabetes, desired ranges of glucose level fasting, after meals and with exercise. Acknowledge that pre and post exercise glucose checks will be done for 3 sessions at entry of program.   Know Your Numbers and Heart Failure: - Group verbal and visual instruction to discuss disease risk factors for cardiac and pulmonary disease and treatment options.  Reviews associated critical values for Overweight/Obesity, Hypertension, Cholesterol, and Diabetes.  Discusses basics of heart failure: signs/symptoms and treatments.  Introduces Heart Failure Zone chart for action plan for heart failure.  Written material given at graduation.   Core Components/Risk Factors/Patient Goals Review:    Core Components/Risk Factors/Patient Goals at Discharge (Final Review):    ITP Comments:  ITP Comments     Row Name 07/17/21 1152 07/24/21 0937         ITP Comments Virtual orientation call completed today. shehas an appointment on Date: 07/24/2021  for EP eval and gym Orientation.  Documentation of diagnosis can be found in [facility] Date: 06/14/2021 . Completed and gym orientation. Initial ITP created and sent for review to Dr. Vida Rigger, Medical Director.               Comments: Initial ITP

## 2021-07-30 ENCOUNTER — Other Ambulatory Visit: Payer: Self-pay

## 2021-07-30 DIAGNOSIS — J449 Chronic obstructive pulmonary disease, unspecified: Secondary | ICD-10-CM

## 2021-07-30 NOTE — Progress Notes (Signed)
Daily Session Note  Patient Details  Name: Bailey Maldonado MRN: [id number] Date of Birth: 13-Aug-1961 Referring Provider:   Flowsheet Row Pulmonary Rehab from 07/24/2021 in [facility] Cardiac and Pulmonary Rehab  Referring Provider Willy Eddy MD       Encounter Date: 07/30/2021  Check In:  Session Check In - 07/30/21 1602       Check-In   Supervising physician immediately available to respond to emergencies See telemetry face sheet for immediately available ER MD    Location [facility]-Cardiac & Pulmonary Rehab    Staff Present Birdie Sons, MPA, Nino Glow, MS, ASCM CEP, Exercise Physiologist;Melissa Verdon, RDN, Rowe Pavy, BA, ACSM CEP, Exercise Physiologist    Virtual Visit No    Medication changes reported     No    Fall or balance concerns reported    No    Warm-up and Cool-down Performed on first and last piece of equipment    Resistance Training Performed Yes    VAD Patient? No    PAD/SET Patient? No      Pain Assessment   Currently in Pain? No/denies                Social History   Tobacco Use  Smoking Status Former   Packs/day: 1.50   Years: 35.00   Pack years: 52.50   Types: Cigarettes   Quit date: 11/05/2015   Years since quitting: 5.7  Smokeless Tobacco Never    Goals Met:  Independence with exercise equipment Exercise tolerated well No report of concerns or symptoms today Strength training completed today  Goals Unmet:  Not Applicable  Comments: First full day of exercise!  Patient was oriented to gym and equipment including functions, settings, policies, and procedures.  Patient's individual exercise prescription and treatment plan were reviewed.  All starting workloads were established based on the results of the 6 minute walk test done at initial orientation visit.  The plan for exercise progression was also introduced and progression will be customized based on patient's performance and goals.    Dr. Emily Filbert is Medical  Director for [place].  Dr. Ottie Glazier is Medical Director for [facility] Pulmonary Rehabilitation.

## 2021-08-01 ENCOUNTER — Other Ambulatory Visit: Payer: Self-pay

## 2021-08-01 DIAGNOSIS — J449 Chronic obstructive pulmonary disease, unspecified: Secondary | ICD-10-CM | POA: Diagnosis not present

## 2021-08-01 NOTE — Progress Notes (Signed)
Daily Session Note  Patient Details  Name: Bailey Maldonado MRN: [id number] Date of Birth: [birth year] Referring Provider:   Flowsheet Row Pulmonary Rehab from 07/24/2021 in [facility] Cardiac and Pulmonary Rehab  Referring Provider Willy Eddy MD       Encounter Date: 08/01/2021  Check In:  Session Check In - 08/01/21 1610       Check-In   Supervising physician immediately available to respond to emergencies See telemetry face sheet for immediately available ER MD    Location [facility]-Cardiac & Pulmonary Rehab    Staff Present Birdie Sons, MPA, Nino Glow, MS, ASCM CEP, Exercise Physiologist;Joseph Tessie Fass, [place]    Virtual Visit No    Medication changes reported     No    Fall or balance concerns reported    No    Warm-up and Cool-down Performed on first and last piece of equipment    Resistance Training Performed Yes    VAD Patient? No    PAD/SET Patient? No      Pain Assessment   Currently in Pain? No/denies                Social History   Tobacco Use  Smoking Status Former   Packs/day: 1.50   Years: 35.00   Pack years: 52.50   Types: Cigarettes   Quit date: 11/05/2015   Years since quitting: 5.7  Smokeless Tobacco Never    Goals Met:  Independence with exercise equipment Exercise tolerated well No report of concerns or symptoms today Strength training completed today  Goals Unmet:  Not Applicable  Comments: Pt able to follow exercise prescription today without complaint.  Will continue to monitor for progression.    Dr. Emily Filbert is Medical Director for [place].  Dr. Ottie Glazier is Medical Director for [facility] Pulmonary Rehabilitation.

## 2021-08-02 ENCOUNTER — Encounter: Payer: Medicaid Other | Admitting: *Deleted

## 2021-08-02 DIAGNOSIS — J449 Chronic obstructive pulmonary disease, unspecified: Secondary | ICD-10-CM

## 2021-08-02 NOTE — Progress Notes (Signed)
Daily Session Note  Patient Details  Name: Bailey Maldonado MRN: [id number] Date of Birth: 1961/01/05 Referring Provider:   Flowsheet Row Pulmonary Rehab from 07/24/2021 in [facility] Cardiac and Pulmonary Rehab  Referring Provider Willy Eddy MD       Encounter Date: 08/02/2021  Check In:  Session Check In - 08/02/21 1601       Check-In   Supervising physician immediately available to respond to emergencies See telemetry face sheet for immediately available ER MD    Location [facility]-Cardiac & Pulmonary Rehab    Staff Present Renita Papa, RN BSN;Joseph [place], RCP,RRT,BSRT;Jessica [place], [place], RCEP, CCRP, CCET    Virtual Visit No    Medication changes reported     No    Fall or balance concerns reported    No    Warm-up and Cool-down Performed on first and last piece of equipment    Resistance Training Performed Yes    VAD Patient? No    PAD/SET Patient? No      Pain Assessment   Currently in Pain? No/denies                Social History   Tobacco Use  Smoking Status Former   Packs/day: 1.50   Years: 35.00   Pack years: 52.50   Types: Cigarettes   Quit date: 11/05/2015   Years since quitting: 5.7  Smokeless Tobacco Never    Goals Met:  Independence with exercise equipment Exercise tolerated well No report of concerns or symptoms today Strength training completed today  Goals Unmet:  Not Applicable  Comments: Pt able to follow exercise prescription today without complaint.  Will continue to monitor for progression.    Dr. Emily Filbert is Medical Director for [place].  Dr. Ottie Glazier is Medical Director for [facility] Pulmonary Rehabilitation.

## 2021-08-06 ENCOUNTER — Encounter: Payer: Medicaid Other | Attending: Pulmonary Disease

## 2021-08-06 ENCOUNTER — Other Ambulatory Visit: Payer: Self-pay

## 2021-08-06 DIAGNOSIS — Z87891 Personal history of nicotine dependence: Secondary | ICD-10-CM | POA: Diagnosis not present

## 2021-08-06 DIAGNOSIS — J449 Chronic obstructive pulmonary disease, unspecified: Secondary | ICD-10-CM | POA: Diagnosis not present

## 2021-08-06 NOTE — Progress Notes (Signed)
Daily Session Note  Patient Details  Name: Bailey Maldonado MRN: [id number] Date of Birth: 05-Feb-1961 Referring Provider:   Flowsheet Row Pulmonary Rehab from 07/24/2021 in [facility] Cardiac and Pulmonary Rehab  Referring Provider Willy Eddy MD       Encounter Date: 08/06/2021  Check In:  Session Check In - 08/06/21 1606       Check-In   Supervising physician immediately available to respond to emergencies See telemetry face sheet for immediately available ER MD    Location [facility]-Cardiac & Pulmonary Rehab    Staff Present Birdie Sons, MPA, Nino Glow, MS, ASCM CEP, Exercise Physiologist;Meredith Sherryll Burger, RN BSN    Virtual Visit No    Medication changes reported     No    Fall or balance concerns reported    No    Warm-up and Cool-down Performed on first and last piece of equipment    Resistance Training Performed Yes    VAD Patient? No    PAD/SET Patient? No      Pain Assessment   Currently in Pain? No/denies                Social History   Tobacco Use  Smoking Status Former   Packs/day: 1.50   Years: 35.00   Pack years: 52.50   Types: Cigarettes   Quit date: 11/05/2015   Years since quitting: 5.7  Smokeless Tobacco Never    Goals Met:  Independence with exercise equipment Exercise tolerated well No report of concerns or symptoms today Strength training completed today  Goals Unmet:  Not Applicable  Comments: Pt able to follow exercise prescription today without complaint.  Will continue to monitor for progression.    Dr. Emily Filbert is Medical Director for [place].  Dr. Ottie Glazier is Medical Director for [facility] Pulmonary Rehabilitation.

## 2021-08-08 ENCOUNTER — Other Ambulatory Visit: Payer: Self-pay

## 2021-08-08 ENCOUNTER — Encounter: Payer: Self-pay | Admitting: *Deleted

## 2021-08-08 DIAGNOSIS — J449 Chronic obstructive pulmonary disease, unspecified: Secondary | ICD-10-CM | POA: Diagnosis not present

## 2021-08-08 NOTE — Progress Notes (Signed)
Pulmonary Individual Treatment Plan  Patient Details  Name: Bailey Maldonado MRN: [id number] Date of Birth: [birth year] Referring Provider:   Flowsheet Row Pulmonary Rehab from 07/24/2021 in [facility] Cardiac and Pulmonary Rehab  Referring Provider Dyke Brackett MD       Initial Encounter Date:  Flowsheet Row Pulmonary Rehab from 07/24/2021 in [facility] Cardiac and Pulmonary Rehab  Date 07/24/21       Visit Diagnosis: Chronic obstructive pulmonary disease, unspecified COPD type (HCC)  Patient's Home Medications on Admission:  Current Outpatient Medications:    albuterol (PROVENTIL) (2.5 MG/3ML) 0.083% nebulizer solution, Inhale into the lungs., Disp: , Rfl:    albuterol (VENTOLIN HFA) 108 (90 Base) MCG/ACT inhaler, Inhale into the lungs., Disp: , Rfl:    clobetasol (TEMOVATE) 0.05 % external solution, Apply topically 2 (two) times daily as needed., Disp: , Rfl:    fluticasone (FLONASE) 50 MCG/ACT nasal spray, 2 sprays into each nostril two (2) times a day., Disp: , Rfl:    fluticasone-salmeterol (ADVAIR) 250-50 MCG/ACT AEPB, Inhale into the lungs., Disp: , Rfl:    hydrOXYzine (ATARAX/VISTARIL) 25 MG tablet, Take 25 mg by mouth in the morning and at bedtime., Disp: , Rfl:    Multiple Vitamin (MULTIVITAMIN) tablet, Take 1 tablet by mouth daily., Disp: , Rfl:    pramipexole (MIRAPEX) 0.125 MG tablet, Take by mouth., Disp: , Rfl:    spironolactone (ALDACTONE) 100 MG tablet, Take 100 mg by mouth daily., Disp: , Rfl:    Tiotropium Bromide Monohydrate (SPIRIVA RESPIMAT) 2.5 MCG/ACT AERS, Inhale into the lungs., Disp: , Rfl:    ursodiol (ACTIGALL) 300 MG capsule, Take by mouth., Disp: , Rfl:   Past Medical History: No past medical history on file.  Tobacco Use: Social History   Tobacco Use  Smoking Status Former   Packs/day: 1.50   Years: 35.00   Pack years: 52.50   Types: Cigarettes   Quit date: 11/05/2015   Years since quitting: 5.7  Smokeless Tobacco Never    Labs: Recent Review  Flowsheet Data   There is no flowsheet data to display.      Pulmonary Assessment Scores:  Pulmonary Assessment Scores     Row Name 07/24/21 [phone] 07/30/21 1654       ADL UCSD   ADL Phase Entry --    SOB Score total 21 --    Rest 0 --    Walk 1 --    Stairs 5 --    Bath 0 --    Dress 0 --    Shop 1 --         CAT Score   CAT Score 11 --         mMRC Score   mMRC Score -- 1             UCSD: Self-administered rating of dyspnea associated with activities of daily living (ADLs) 6-point scale (0 = "not at all" to 5 = "maximal or unable to do because of breathlessness")  Scoring Scores range from 0 to 120.  Minimally important difference is 5 units  CAT: CAT can identify the health impairment of COPD patients and is better correlated with disease progression.  CAT has a scoring range of zero to 40. The CAT score is classified into four groups of low (less than 10), medium (10 - 20), high (21-30) and very high (31-40) based on the impact level of disease on health status. A CAT score over 10 suggests significant symptoms.  A worsening CAT score  could be explained by an exacerbation, poor medication adherence, poor inhaler technique, or progression of COPD or comorbid conditions.  CAT MCID is 2 points  mMRC: mMRC (Modified Medical Research Council) Dyspnea Scale is used to assess the degree of baseline functional disability in patients of respiratory disease due to dyspnea. No minimal important difference is established. A decrease in score of 1 point or greater is considered a positive change.   Pulmonary Function Assessment:   Exercise Target Goals: Exercise Program Goal: Individual exercise prescription set using results from initial 6 min walk test and THRR while considering  patient's activity barriers and safety.   Exercise Prescription Goal: Initial exercise prescription builds to 30-45 minutes a day of aerobic activity, 2-3 days per week.  Home exercise  guidelines will be given to patient during program as part of exercise prescription that the participant will acknowledge.  Education: Aerobic Exercise: - Group verbal and visual presentation on the components of exercise prescription. Introduces F.I.T.T principle from ACSM for exercise prescriptions.  Reviews F.I.T.T. principles of aerobic exercise including progression. Written material given at graduation.   Education: Resistance Exercise: - Group verbal and visual presentation on the components of exercise prescription. Introduces F.I.T.T principle from ACSM for exercise prescriptions  Reviews F.I.T.T. principles of resistance exercise including progression. Written material given at graduation.    Education: Exercise & Equipment Safety: - Individual verbal instruction and demonstration of equipment use and safety with use of the equipment. Flowsheet Row Pulmonary Rehab from 08/01/2021 in [facility] Cardiac and Pulmonary Rehab  Education need identified 07/24/21  Date 07/24/21  Educator KL  Instruction Review Code 1- Verbalizes Understanding       Education: Exercise Physiology & General Exercise Guidelines: - Group verbal and written instruction with models to review the exercise physiology of the cardiovascular system and associated critical values. Provides general exercise guidelines with specific guidelines to those with heart or lung disease.  Flowsheet Row Pulmonary Rehab from 08/01/2021 in [facility] Cardiac and Pulmonary Rehab  Education need identified 07/24/21       Education: Flexibility, Balance, Mind/Body Relaxation: - Group verbal and visual presentation with interactive activity on the components of exercise prescription. Introduces F.I.T.T principle from ACSM for exercise prescriptions. Reviews F.I.T.T. principles of flexibility and balance exercise training including progression. Also discusses the mind body connection.  Reviews various relaxation techniques to help reduce and  manage stress (i.e. Deep breathing, progressive muscle relaxation, and visualization). Balance handout provided to take home. Written material given at graduation.   Activity Barriers & Risk Stratification:  Activity Barriers & Cardiac Risk Stratification - 07/24/21 0955       Activity Barriers & Cardiac Risk Stratification   Activity Barriers Shortness of Breath;Deconditioning             6 Minute Walk:  6 Minute Walk     Row Name 07/24/21 0954         6 Minute Walk   Phase Initial     Distance 1300 feet     Walk Time 6 minutes     # of Rest Breaks 0     MPH 2.46     METS 3.44     RPE 7     Perceived Dyspnea  1     VO2 Peak 12.06     Symptoms Yes (comment)     Comments Slight SOB     Resting HR 80 bpm     Resting BP 102/62     Resting Oxygen Saturation  97 %     Exercise Oxygen Saturation  during 6 min walk 89 %     Max Ex. HR 106 bpm     Max Ex. BP 112/66     2 Minute Post BP 100/62           Interval HR   1 Minute HR 97     2 Minute HR 102     3 Minute HR 103     4 Minute HR 105     5 Minute HR 106     6 Minute HR 106     2 Minute Post HR 86     Interval Heart Rate? Yes           Interval Oxygen   Interval Oxygen? Yes     Baseline Oxygen Saturation % 97 %     1 Minute Oxygen Saturation % 94 %     1 Minute Liters of Oxygen 0 L  RA     2 Minute Oxygen Saturation % 93 %     2 Minute Liters of Oxygen 0 L     3 Minute Oxygen Saturation % 91 %     3 Minute Liters of Oxygen 0 L     4 Minute Oxygen Saturation % 89 %     4 Minute Liters of Oxygen 0 L     5 Minute Oxygen Saturation % 90 %     5 Minute Liters of Oxygen 0 L     6 Minute Oxygen Saturation % 90 %     6 Minute Liters of Oxygen 0 L     2 Minute Post Oxygen Saturation % 93 %     2 Minute Post Liters of Oxygen 0 L             Oxygen Initial Assessment:  Oxygen Initial Assessment - 07/24/21 0938       Home Oxygen   Home Oxygen Device None    Sleep Oxygen Prescription None    Home  Exercise Oxygen Prescription None    Home Resting Oxygen Prescription None      Initial 6 min Walk   Oxygen Used None      Program Oxygen Prescription   Program Oxygen Prescription None      Intervention   Short Term Goals To learn and demonstrate proper pursed lip breathing techniques or other breathing techniques. ;To learn and demonstrate proper use of respiratory medications;To learn and understand importance of maintaining oxygen saturations>88%;To learn and understand importance of monitoring SPO2 with pulse oximeter and demonstrate accurate use of the pulse oximeter.;To learn and exhibit compliance with exercise, home and travel O2 prescription    Long  Term Goals Exhibits proper breathing techniques, such as pursed lip breathing or other method taught during program session;Compliance with respiratory medication;Demonstrates proper use of MDI's;Maintenance of O2 saturations>88%;Verbalizes importance of monitoring SPO2 with pulse oximeter and return demonstration;Exhibits compliance with exercise, home  and travel O2 prescription             Oxygen Re-Evaluation:   Oxygen Discharge (Final Oxygen Re-Evaluation):   Initial Exercise Prescription:  Initial Exercise Prescription - 07/24/21 1000       Date of Initial Exercise RX and Referring Provider   Date 07/24/21    Referring Provider Dyke Brackett MD      Treadmill   MPH 2.6    Grade 0.5    Minutes 15    METs 3.17  Recumbant Bike   Level 3    RPM 60    Watts 30    Minutes 15    METs 3.4      NuStep   Level 3    SPM 80    Minutes 15    METs 3.4      REL-XR   Level 2    Speed 50    Minutes 15    METs 3.4      Prescription Details   Frequency (times per week) 3    Duration Progress to 30 minutes of continuous aerobic without signs/symptoms of physical distress      Intensity   THRR 40-80% of Max Heartrate 113-146    Ratings of Perceived Exertion 11-13    Perceived Dyspnea 0-4       Progression   Progression Continue to progress workloads to maintain intensity without signs/symptoms of physical distress.      Resistance Training   Training Prescription Yes    Weight 3 lb    Reps 10-15             Perform Capillary Blood Glucose checks as needed.  Exercise Prescription Changes:   Exercise Prescription Changes     Row Name 07/24/21 1000 08/06/21 1600           Response to Exercise   Blood Pressure (Admit) 102/62 102/60      Blood Pressure (Exercise) 112/66 108/60      Blood Pressure (Exit) 100/62 108/68      Heart Rate (Admit) 80 bpm 89 bpm      Heart Rate (Exercise) 106 bpm 109 bpm      Heart Rate (Exit) 94 bpm 103 bpm      Oxygen Saturation (Admit) 97 % 96 %      Oxygen Saturation (Exercise) 89 % 92 %      Oxygen Saturation (Exit) 96 % 94 %      Rating of Perceived Exertion (Exercise) 7 12      Perceived Dyspnea (Exercise) 1 2      Symptoms Slight SOB SOB      Comments walk test results --      Duration -- Progress to 30 minutes of  aerobic without signs/symptoms of physical distress      Intensity -- THRR unchanged             Progression   Progression -- Continue to progress workloads to maintain intensity without signs/symptoms of physical distress.      Average METs -- 3.42             Resistance Training   Training Prescription -- Yes      Weight -- 3 lb      Reps -- 10-15             Interval Training   Interval Training -- No             Treadmill   MPH -- 2.6      Grade -- 0.5      Minutes -- 15      METs -- 3.17             NuStep   Level -- 3      Minutes -- 15      METs -- 3.1             REL-XR   Level -- 2      Minutes -- 15  METs -- 4             Oxygen   Maintain Oxygen Saturation -- 88% or higher               Exercise Comments:   Exercise Comments     Row Name 07/30/21 1603           Exercise Comments First full day of exercise!  Patient was oriented to gym and equipment including  functions, settings, policies, and procedures.  Patient's individual exercise prescription and treatment plan were reviewed.  All starting workloads were established based on the results of the 6 minute walk test done at initial orientation visit.  The plan for exercise progression was also introduced and progression will be customized based on patient's performance and goals.                Exercise Goals and Review:   Exercise Goals     Row Name 07/24/21 1009             Exercise Goals   Increase Physical Activity Yes       Intervention Provide advice, education, support and counseling about physical activity/exercise needs.;Develop an individualized exercise prescription for aerobic and resistive training based on initial evaluation findings, risk stratification, comorbidities and participant's personal goals.       Expected Outcomes Short Term: Attend rehab on a regular basis to increase amount of physical activity.;Long Term: Add in home exercise to make exercise part of routine and to increase amount of physical activity.;Long Term: Exercising regularly at least 3-5 days a week.       Increase Strength and Stamina Yes       Intervention Develop an individualized exercise prescription for aerobic and resistive training based on initial evaluation findings, risk stratification, comorbidities and participant's personal goals.;Provide advice, education, support and counseling about physical activity/exercise needs.       Expected Outcomes Short Term: Increase workloads from initial exercise prescription for resistance, speed, and METs.;Short Term: Perform resistance training exercises routinely during rehab and add in resistance training at home;Long Term: Improve cardiorespiratory fitness, muscular endurance and strength as measured by increased METs and functional capacity ( )       Able to understand and use rate of perceived exertion (RPE) scale Yes       Intervention Provide  education and explanation on how to use RPE scale       Expected Outcomes Short Term: Able to use RPE daily in rehab to express subjective intensity level;Long Term:  Able to use RPE to guide intensity level when exercising independently       Able to understand and use Dyspnea scale Yes       Intervention Provide education and explanation on how to use Dyspnea scale       Expected Outcomes Short Term: Able to use Dyspnea scale daily in rehab to express subjective sense of shortness of breath during exertion;Long Term: Able to use Dyspnea scale to guide intensity level when exercising independently       Knowledge and understanding of Target Heart Rate Range (THRR) Yes       Intervention Provide education and explanation of THRR including how the numbers were predicted and where they are located for reference       Expected Outcomes Short Term: Able to state/look up THRR;Long Term: Able to use THRR to govern intensity when exercising independently;Short Term: Able to use daily as guideline for intensity in rehab  Able to check pulse independently Yes       Intervention Provide education and demonstration on how to check pulse in carotid and radial arteries.;Review the importance of being able to check your own pulse for safety during independent exercise       Expected Outcomes Short Term: Able to explain why pulse checking is important during independent exercise;Long Term: Able to check pulse independently and accurately       Understanding of Exercise Prescription Yes       Intervention Provide education, explanation, and written materials on patient's individual exercise prescription       Expected Outcomes Short Term: Able to explain program exercise prescription;Long Term: Able to explain home exercise prescription to exercise independently                Exercise Goals Re-Evaluation :  Exercise Goals Re-Evaluation     Row Name 07/30/21 1603 08/06/21 1609           Exercise  Goal Re-Evaluation   Exercise Goals Review Increase Physical Activity;Able to understand and use rate of perceived exertion (RPE) scale;Knowledge and understanding of Target Heart Rate Range (THRR);Understanding of Exercise Prescription;Increase Strength and Stamina;Able to understand and use Dyspnea scale;Able to check pulse independently Increase Physical Activity;Increase Strength and Stamina;Understanding of Exercise Prescription      Comments Reviewed RPE and dyspnea scales, THR and program prescription with pt today.  Pt voiced understanding and was given a copy of goals to take home. Dawn is off to a good start in rehab.  She is on her fourth full day of exercise today.  We will continue to monitor her progress.      Expected Outcomes Short: Use RPE daily to regulate intensity. Long: Follow program prescription in THR. Short: Continue to attend rehab regularly Long: Continue to follow program prescription               Discharge Exercise Prescription (Final Exercise Prescription Changes):  Exercise Prescription Changes - 08/06/21 1600       Response to Exercise   Blood Pressure (Admit) 102/60    Blood Pressure (Exercise) 108/60    Blood Pressure (Exit) 108/68    Heart Rate (Admit) 89 bpm    Heart Rate (Exercise) 109 bpm    Heart Rate (Exit) 103 bpm    Oxygen Saturation (Admit) 96 %    Oxygen Saturation (Exercise) 92 %    Oxygen Saturation (Exit) 94 %    Rating of Perceived Exertion (Exercise) 12    Perceived Dyspnea (Exercise) 2    Symptoms SOB    Duration Progress to 30 minutes of  aerobic without signs/symptoms of physical distress    Intensity THRR unchanged      Progression   Progression Continue to progress workloads to maintain intensity without signs/symptoms of physical distress.    Average METs 3.42      Resistance Training   Training Prescription Yes    Weight 3 lb    Reps 10-15      Interval Training   Interval Training No      Treadmill   MPH 2.6     Grade 0.5    Minutes 15    METs 3.17      NuStep   Level 3    Minutes 15    METs 3.1      REL-XR   Level 2    Minutes 15    METs 4      Oxygen  Maintain Oxygen Saturation 88% or higher             Nutrition:  Target Goals: Understanding of nutrition guidelines, daily intake of sodium 1500mg , cholesterol 200mg , calories 30% from fat and 7% or less from saturated fats, daily to have 5 or more servings of fruits and vegetables.  Education: All About Nutrition: -Group instruction provided by verbal, written material, interactive activities, discussions, models, and posters to present general guidelines for heart healthy nutrition including fat, fiber, MyPlate, the role of sodium in heart healthy nutrition, utilization of the nutrition label, and utilization of this knowledge for meal planning. Follow up email sent as well. Written material given at graduation. Flowsheet Row Pulmonary Rehab from 08/01/2021 in [facility] Cardiac and Pulmonary Rehab  Education need identified 07/24/21       Biometrics:  Pre Biometrics - 07/24/21 0943       Pre Biometrics   Height 5' 6.1" (1.679 m)    Weight 176 lb 9.6 oz (80.1 kg)    BMI (Calculated) 28.42    Single Leg Stand 9.31 seconds              Nutrition Therapy Plan and Nutrition Goals:  Nutrition Therapy & Goals - 07/24/21 0940       Intervention Plan   Intervention Prescribe, educate and counsel regarding individualized specific dietary modifications aiming towards targeted core components such as weight, hypertension, lipid management, diabetes, heart failure and other comorbidities.    Expected Outcomes Short Term Goal: Understand basic principles of dietary content, such as calories, fat, sodium, cholesterol and nutrients.;Short Term Goal: A plan has been developed with personal nutrition goals set during dietitian appointment.;Long Term Goal: Adherence to prescribed nutrition plan.             Nutrition  Assessments:  MEDIFICTS Score Key: ?70 Need to make dietary changes  40-70 Heart Healthy Diet ? 40 Therapeutic Level Cholesterol Diet  Flowsheet Row Pulmonary Rehab from 07/24/2021 in [facility] Cardiac and Pulmonary Rehab  Picture Your Plate Total Score on Admission 59      Picture Your Plate Scores: <30 Unhealthy dietary pattern with much room for improvement. 41-50 Dietary pattern unlikely to meet recommendations for good health and room for improvement. 51-60 More healthful dietary pattern, with some room for improvement.  >60 Healthy dietary pattern, although there may be some specific behaviors that could be improved.   Nutrition Goals Re-Evaluation:   Nutrition Goals Discharge (Final Nutrition Goals Re-Evaluation):   Psychosocial: Target Goals: Acknowledge presence or absence of significant depression and/or stress, maximize coping skills, provide positive support system. Participant is able to verbalize types and ability to use techniques and skills needed for reducing stress and depression.   Education: Stress, Anxiety, and Depression - Group verbal and visual presentation to define topics covered.  Reviews how body is impacted by stress, anxiety, and depression.  Also discusses healthy ways to reduce stress and to treat/manage anxiety and depression.  Written material given at graduation.   Education: Sleep Hygiene -Provides group verbal and written instruction about how sleep can affect your health.  Define sleep hygiene, discuss sleep cycles and impact of sleep habits. Review good sleep hygiene tips.    Initial Review & Psychosocial Screening:  Initial Psych Review & Screening - 07/17/21 1139       Initial Review   Current issues with Current Stress Concerns    Source of Stress Concerns Family    Comments Husband  demntia and in facility  Family Dynamics   Good Support System? Yes   daughter   close friend     Barriers   Psychosocial barriers to participate in  program There are no identifiable barriers or psychosocial needs.;The patient should benefit from training in stress management and relaxation.      Screening Interventions   Interventions Encouraged to exercise;To provide support and resources with identified psychosocial needs;Provide feedback about the scores to participant    Expected Outcomes Short Term goal: Utilizing psychosocial counselor, staff and physician to assist with identification of specific Stressors or current issues interfering with healing process. Setting desired goal for each stressor or current issue identified.;Long Term Goal: Stressors or current issues are controlled or eliminated.;Short Term goal: Identification and review with participant of any Quality of Life or Depression concerns found by scoring the questionnaire.;Long Term goal: The participant improves quality of Life and PHQ9 Scores as seen by post scores and/or verbalization of changes             Quality of Life Scores:  Scores of 19 and below usually indicate a poorer quality of life in these areas.  A difference of  2-3 points is a clinically meaningful difference.  A difference of 2-3 points in the total score of the Quality of Life Index has been associated with significant improvement in overall quality of life, self-image, physical symptoms, and general health in studies assessing change in quality of life.  PHQ-9: Recent Review Flowsheet Data     Depression screen [facility] 2/9 07/24/2021   Decreased Interest 0   Down, Depressed, Hopeless 0   PHQ - 2 Score 0   Altered sleeping 0   Tired, decreased energy 1   Change in appetite 2   Feeling bad or failure about yourself  0   Trouble concentrating 0   Moving slowly or fidgety/restless 0   Suicidal thoughts 0   PHQ-9 Score 3   Difficult doing work/chores Not difficult at all      Interpretation of Total Score  Total Score Depression Severity:  1-4 = Minimal depression, 5-9 = Mild depression,  10-14 = Moderate depression, 15-19 = Moderately severe depression, 20-27 = Severe depression   Psychosocial Evaluation and Intervention:  Psychosocial Evaluation - 07/17/21 1145       Psychosocial Evaluation & Interventions   Interventions Encouraged to exercise with the program and follow exercise prescription;Relaxation education;Stress management education    Comments Avo has no barriers to attending the program. She has support system of her daughter and a close friend. She does have significant stress as her husband has dementia and lives in a facility in [place]. She goes to see him daily. She has gained weight since quitting cigarettes in2017. She quit because she wanted to be here for her grandchild that was born on her birthday.  She wants to get back to her weight before she quit smoking.  She is ready to get started.    Expected Outcomes STG: Siddhi will be able to attend all scheduled sessions, she will gain knowledge to help her reduce her stress. LTG: Zayah will continue to use what she learned in the program to help with her stress.    Continue Psychosocial Services  Follow up required by staff             Psychosocial Re-Evaluation:   Psychosocial Discharge (Final Psychosocial Re-Evaluation):   Education: Education Goals: Education classes will be provided on a weekly basis, covering required topics. Participant will state understanding/return  demonstration of topics presented.  Learning Barriers/Preferences:   General Pulmonary Education Topics:  Infection Prevention: - Provides verbal and written material to individual with discussion of infection control including proper hand washing and proper equipment cleaning during exercise session. Flowsheet Row Pulmonary Rehab from 08/01/2021 in [facility] Card[facility] and Pulmonary Rehab  Education need identified 07/24/21  Date 07/24/21  Educator KL  Instruction Review Code 1- Verbalizes Understanding       Falls  Prevention: - Provides verbal and written material to individual with discussion of falls prevention and safety. Flowsheet Row Pulmonary Rehab from 08/01/2021 in [facility] Cardiac and Pulmonary Rehab  Education need identified 07/24/21  Date 07/24/21  Educator KL  Instruction Review Code 1- Verbalizes Understanding       Chronic Lung Disease Review: - Group verbal instruction with posters, models, PowerPoint presentations and videos,  to review new updates, new respiratory medications, new advancements in procedures and treatments. Providing information on websites and "800" numbers for continued self-education. Includes information about supplement oxygen, available portable oxygen systems, continuous and intermittent flow rates, oxygen safety, concentrators, and Medicare reimbursement for oxygen. Explanation of Pulmonary Drugs, including class, frequency, complications, importance of spacers, rinsing mouth after steroid MDI's, and proper cleaning methods for nebulizers. Review of basic lung anatomy and physiology related to function, structure, and complications of lung disease. Review of risk factors. Discussion about methods for diagnosing sleep apnea and types of masks and machines for OSA. Includes a review of the use of types of environmental controls: home humidity, furnaces, filters, dust mite/pet prevention, HEPA vacuums. Discussion about weather changes, air quality and the benefits of nasal washing. Instruction on Warning signs, infection symptoms, calling MD promptly, preventive modes, and value of vaccinations. Review of effective airway clearance, coughing and/or vibration techniques. Emphasizing that all should Create an Action Plan. Written material given at graduation. Flowsheet Row Pulmonary Rehab from 08/01/2021 in [facility] Cardiac and Pulmonary Rehab  Education need identified 07/24/21       AED/CPR: - Group verbal and written instruction with the use of models to demonstrate the basic  use of the AED with the basic ABC's of resuscitation.    Anatomy and Cardiac Procedures: - Group verbal and visual presentation and models provide information about basic cardiac anatomy and function. Reviews the testing methods done to diagnose heart disease and the outcomes of the test results. Describes the treatment choices: Medical Management, Angioplasty, or Coronary Bypass Surgery for treating various heart conditions including Myocardial Infarction, Angina, Valve Disease, and Cardiac Arrhythmias.  Written material given at graduation.   Medication Safety: - Group verbal and visual instruction to review commonly prescribed medications for heart and lung disease. Reviews the medication, class of the drug, and side effects. Includes the steps to properly store meds and maintain the prescription regimen.  Written material given at graduation.   Other: -Provides group and verbal instruction on various topics (see comments)   Knowledge Questionnaire Score:  Knowledge Questionnaire Score - 07/24/21 0942       Knowledge Questionnaire Score   Pre Score 13/18: HR, ADL, Nutrition, O2              Core Components/Risk Factors/Patient Goals at Admission:  Personal Goals and Risk Factors at Admission - 07/24/21 1009       Core Components/Risk Factors/Patient Goals on Admission    Weight Management Yes;Weight Loss    Intervention Weight Management: Develop a combined nutrition and exercise program designed to reach desired caloric intake, while maintaining appropriate intake  of nutrient and fiber, sodium and fats, and appropriate energy expenditure required for the weight goal.;Weight Management: Provide education and appropriate resources to help participant work on and attain dietary goals.;Weight Management/Obesity: Establish reasonable short term and long term weight goals.    Admit Weight 176 lb (79.8 kg)    Goal Weight: Short Term 170 lb (77.1 kg)    Goal Weight: Long Term 160 lb  (72.6 kg)    Expected Outcomes Short Term: Continue to assess and modify interventions until short term weight is achieved;Long Term: Adherence to nutrition and physical activity/exercise program aimed toward attainment of established weight goal;Weight Loss: Understanding of general recommendations for a balanced deficit meal plan, which promotes 1-2 lb weight loss per week and includes a negative energy balance of [id number] kcal/d;Understanding recommendations for meals to include 15-35% energy as protein, 25-35% energy from fat, 35-60% energy from carbohydrates, less than 200mg  of dietary cholesterol, 20-35 gm of total fiber daily;Understanding of distribution of calorie intake throughout the day with the consumption of 4-5 meals/snacks    Improve shortness of breath with ADL's Yes    Intervention Provide education, individualized exercise plan and daily activity instruction to help decrease symptoms of SOB with activities of daily living.    Expected Outcomes Short Term: Improve cardiorespiratory fitness to achieve a reduction of symptoms when performing ADLs;Long Term: Be able to perform more ADLs without symptoms or delay the onset of symptoms    Increase knowledge of respiratory medications and ability to use respiratory devices properly  Yes    Intervention Provide education and demonstration as needed of appropriate use of medications, inhalers, and oxygen therapy.    Expected Outcomes Short Term: Achieves understanding of medications use. Understands that oxygen is a medication prescribed by physician. Demonstrates appropriate use of inhaler and oxygen therapy.;Long Term: Maintain appropriate use of medications, inhalers, and oxygen therapy.             Education:Diabetes - Individual verbal and written instruction to review signs/symptoms of diabetes, desired ranges of glucose level fasting, after meals and with exercise. Acknowledge that pre and post exercise glucose checks will be done  for 3 sessions at entry of program.   Know Your Numbers and Heart Failure: - Group verbal and visual instruction to discuss disease risk factors for cardiac and pulmonary disease and treatment options.  Reviews associated critical values for Overweight/Obesity, Hypertension, Cholesterol, and Diabetes.  Discusses basics of heart failure: signs/symptoms and treatments.  Introduces Heart Failure Zone chart for action plan for heart failure.  Written material given at graduation. Flowsheet Row Pulmonary Rehab from 08/01/2021 in [facility] Cardiac and Pulmonary Rehab  Date 08/01/21  Educator [facility]  Instruction Review Code 1- Verbalizes Understanding       Core Components/Risk Factors/Patient Goals Review:    Core Components/Risk Factors/Patient Goals at Discharge (Final Review):    ITP Comments:  ITP Comments     Row Name 07/17/21 1152 07/24/21 0937 07/30/21 1603 08/08/21 0830     ITP Comments Virtual orientation call completed today. shehas an appointment on Date: 07/24/2021  for EP eval and gym Orientation.  Documentation of diagnosis [facility] be found in [facility] Date: 06/14/2021 . Completed and gym orientation. Initial ITP created and sent for review to Dr. Vida Rigger, Medical Director. First full day of exercise!  Patient was oriented to gym and equipment including functions, settings, policies, and procedures.  Patient's individual exercise prescription and treatment plan were reviewed.  All starting workloads were established based on  the results of the 6 minute walk test done at initial orientation visit.  The plan for exercise progression was also introduced and progression will be customized based on patient's performance and goals. 30 day review completed. ITP sent to Dr. Jinny Sanders, Medical Director of Pulmonary Rehab. Continue with ITP unless changes are made by physician.             Comments: 30 day review

## 2021-08-08 NOTE — Progress Notes (Signed)
Daily Session Note  Patient Details  Name: Bailey Maldonado MRN: [id number] Date of Birth: 07-May-1961 Referring Provider:   Flowsheet Row Pulmonary Rehab from 07/24/2021 in [facility] Cardiac and Pulmonary Rehab  Referring Provider Willy Eddy MD       Encounter Date: 08/08/2021  Check In:  Session Check In - 08/08/21 1607       Check-In   Supervising physician immediately available to respond to emergencies See telemetry face sheet for immediately available ER MD    Location [facility]-Cardiac & Pulmonary Rehab    Staff Present Birdie Sons, MPA, RN;Melissa McArthur, RDN, Tawanna Solo, MS, ASCM CEP, Exercise Physiologist    Virtual Visit No    Medication changes reported     No    Fall or balance concerns reported    No    Warm-up and Cool-down Performed on first and last piece of equipment    Resistance Training Performed Yes    VAD Patient? No    PAD/SET Patient? No      Pain Assessment   Currently in Pain? No/denies                Social History   Tobacco Use  Smoking Status Former   Packs/day: 1.50   Years: 35.00   Pack years: 52.50   Types: Cigarettes   Quit date: 11/05/2015   Years since quitting: 5.7  Smokeless Tobacco Never    Goals Met:  Independence with exercise equipment Exercise tolerated well No report of concerns or symptoms today Strength training completed today  Goals Unmet:  Not Applicable  Comments: Pt able to follow exercise prescription today without complaint.  Will continue to monitor for progression.    Dr. Emily Filbert is Medical Director for [place].  Dr. Ottie Glazier is Medical Director for [facility] Pulmonary Rehabilitation.

## 2021-08-09 ENCOUNTER — Encounter: Payer: Medicaid Other | Admitting: *Deleted

## 2021-08-09 ENCOUNTER — Other Ambulatory Visit: Payer: Self-pay

## 2021-08-09 DIAGNOSIS — J449 Chronic obstructive pulmonary disease, unspecified: Secondary | ICD-10-CM

## 2021-08-09 NOTE — Progress Notes (Signed)
Daily Session Note  Patient Details  Name: Bailey Maldonado MRN: [id number] Date of Birth: Apr 27, 1961 Referring Provider:   Flowsheet Row Pulmonary Rehab from 07/24/2021 in [facility] Cardiac and Pulmonary Rehab  Referring Provider Willy Eddy MD       Encounter Date: 08/09/2021  Check In:  Session Check In - 08/09/21 1556       Check-In   Supervising physician immediately available to respond to emergencies See telemetry face sheet for immediately available ER MD    Location [facility]-Cardiac & Pulmonary Rehab    Staff Present Renita Papa, RN BSN;Joseph [place], RCP,RRT,BSRT;Jessica Breaks, [place], RCEP, CCRP, CCET    Virtual Visit No    Medication changes reported     No    Warm-up and Cool-down Performed on first and last piece of equipment    Resistance Training Performed Yes    VAD Patient? No    PAD/SET Patient? No      Pain Assessment   Currently in Pain? No/denies                Social History   Tobacco Use  Smoking Status Former   Packs/day: 1.50   Years: 35.00   Pack years: 52.50   Types: Cigarettes   Quit date: 11/05/2015   Years since quitting: 5.7  Smokeless Tobacco Never    Goals Met:  Independence with exercise equipment Exercise tolerated well No report of concerns or symptoms today Strength training completed today  Goals Unmet:  Not Applicable  Comments: Pt able to follow exercise prescription today without complaint.  Will continue to monitor for progression.    Dr. Emily Filbert is Medical Director for [place].  Dr. Ottie Glazier is Medical Director for [facility] Pulmonary Rehabilitation.

## 2021-08-15 ENCOUNTER — Other Ambulatory Visit: Payer: Self-pay

## 2021-08-15 DIAGNOSIS — J449 Chronic obstructive pulmonary disease, unspecified: Secondary | ICD-10-CM | POA: Diagnosis not present

## 2021-08-15 NOTE — Progress Notes (Signed)
Daily Session Note  Patient Details  Name: Bailey Maldonado MRN: [id number] Date of Birth: 09/05/1961 Referring Provider:   Flowsheet Row Pulmonary Rehab from 07/24/2021 in [facility] Cardiac and Pulmonary Rehab  Referring Provider Willy Eddy MD       Encounter Date: 08/15/2021  Check In:  Session Check In - 08/15/21 1547       Check-In   Supervising physician immediately available to respond to emergencies See telemetry face sheet for immediately available ER MD    Location [facility]-Cardiac & Pulmonary Rehab    Staff Present Birdie Sons, MPA, Nino Glow, MS, ASCM CEP, Exercise Physiologist;Joseph Tessie Fass, [place]    Virtual Visit No    Medication changes reported     No    Fall or balance concerns reported    No    Warm-up and Cool-down Performed on first and last piece of equipment    Resistance Training Performed Yes    VAD Patient? No    PAD/SET Patient? No      Pain Assessment   Currently in Pain? No/denies                Social History   Tobacco Use  Smoking Status Former   Packs/day: 1.50   Years: 35.00   Pack years: 52.50   Types: Cigarettes   Quit date: 11/05/2015   Years since quitting: 5.7  Smokeless Tobacco Never    Goals Met:  Independence with exercise equipment Exercise tolerated well No report of concerns or symptoms today Strength training completed today  Goals Unmet:  Not Applicable  Comments: Pt able to follow exercise prescription today without complaint.  Will continue to monitor for progression.    Dr. Emily Filbert is Medical Director for [place].  Dr. Ottie Glazier is Medical Director for [facility] Pulmonary Rehabilitation.

## 2021-08-16 ENCOUNTER — Encounter: Payer: Medicaid Other | Admitting: *Deleted

## 2021-08-16 ENCOUNTER — Other Ambulatory Visit: Payer: Self-pay

## 2021-08-16 DIAGNOSIS — J449 Chronic obstructive pulmonary disease, unspecified: Secondary | ICD-10-CM | POA: Diagnosis not present

## 2021-08-16 NOTE — Progress Notes (Signed)
Daily Session Note  Patient Details  Name: Bailey Maldonado MRN: [id number] Date of Birth: 1961/08/03 Referring Provider:   Flowsheet Row Pulmonary Rehab from 07/24/2021 in [facility] Cardiac and Pulmonary Rehab  Referring Provider Willy Eddy MD       Encounter Date: 08/16/2021  Check In:  Session Check In - 08/16/21 [place]       Check-In   Supervising physician immediately available to respond to emergencies See telemetry face sheet for immediately available ER MD    Location [facility]-Cardiac & Pulmonary Rehab    Staff Present Renita Papa, RN BSN;Joseph [place], RCP,RRT,BSRT;Jessica [place]y, [place], RCEP, CCRP, CCET    Virtual Visit No    Medication changes reported     No    Fall or balance concerns reported    No    Warm-up and Cool-down Performed on first and last piece of equipment    Resistance Training Performed Yes    VAD Patient? No    PAD/SET Patient? No      Pain Assessment   Currently in Pain? No/denies                Social History   Tobacco Use  Smoking Status Former   Packs/day: 1.50   Years: 35.00   Pack years: 52.50   Types: Cigarettes   Quit date: 11/05/2015   Years since quitting: 5.7  Smokeless Tobacco Never    Goals Met:  Independence with exercise equipment Exercise tolerated well No report of concerns or symptoms today Strength training completed today  Goals Unmet:  Not Applicable  Comments: Pt able to follow exercise prescription today without complaint.  Will continue to monitor for progression.    Dr. Emily Filbert is Medical Director for [place].  Dr. Ottie Glazier is Medical Director for [facility] Pulmonary Rehabilitation.

## 2021-08-20 ENCOUNTER — Encounter: Payer: Medicaid Other | Admitting: *Deleted

## 2021-08-20 ENCOUNTER — Other Ambulatory Visit: Payer: Self-pay

## 2021-08-20 DIAGNOSIS — J449 Chronic obstructive pulmonary disease, unspecified: Secondary | ICD-10-CM

## 2021-08-20 NOTE — Progress Notes (Signed)
Completed initial RD consultation ?

## 2021-08-20 NOTE — Progress Notes (Signed)
Daily Session Note  Patient Details  Name: Bailey Maldonado MRN: [id number] Date of Birth: 1961/10/11 Referring Provider:   Flowsheet Row Pulmonary Rehab from 07/24/2021 in [facility] Cardiac and Pulmonary Rehab  Referring Provider Willy Eddy MD       Encounter Date: 08/20/2021  Check In:  Session Check In - 08/20/21 1645       Check-In   Supervising physician immediately available to respond to emergencies Virtual Visit, No Supervising Physician Required.    Location [facility]-Cardiac & Pulmonary Rehab    Staff Present Renita Papa, RN Margurite Auerbach, MS, ASCM CEP, Exercise Physiologist;Joseph Tessie Fass, [place]    Virtual Visit No    Medication changes reported     No    Fall or balance concerns reported    No    Warm-up and Cool-down Performed on first and last piece of equipment    Resistance Training Performed Yes    VAD Patient? No    PAD/SET Patient? No      Pain Assessment   Currently in Pain? No/denies                Social History   Tobacco Use  Smoking Status Former   Packs/day: 1.50   Years: 35.00   Pack years: 52.50   Types: Cigarettes   Quit date: 11/05/2015   Years since quitting: 5.7  Smokeless Tobacco Never    Goals Met:  Independence with exercise equipment Exercise tolerated well No report of concerns or symptoms today Strength training completed today  Goals Unmet:  Not Applicable  Comments: Pt able to follow exercise prescription today without complaint.  Will continue to monitor for progression.    Dr. Emily Filbert is Medical Director for [place].  Dr. Ottie Glazier is Medical Director for [facility] Pulmonary Rehabilitation.

## 2021-08-22 ENCOUNTER — Other Ambulatory Visit: Payer: Self-pay

## 2021-08-22 DIAGNOSIS — J449 Chronic obstructive pulmonary disease, unspecified: Secondary | ICD-10-CM

## 2021-08-22 NOTE — Progress Notes (Signed)
Daily Session Note  Patient Details  Name: Bailey Maldonado MRN: [id number] Date of Birth: May 07, 1961 Referring Provider:   Flowsheet Row Pulmonary Rehab from 07/24/2021 in [facility] Cardiac and Pulmonary Rehab  Referring Provider Willy Eddy MD       Encounter Date: 08/22/2021  Check In:  Session Check In - 08/22/21 1606       Check-In   Supervising physician immediately available to respond to emergencies See telemetry face sheet for immediately available ER MD    Location [facility]-Cardiac & Pulmonary Rehab    Staff Present Birdie Sons, MPA, RN;Joseph Tessie Fass, RCP,RRT,BSRT;Melissa [place], RDN, LDN    Virtual Visit No    Medication changes reported     No    Fall or balance concerns reported    No    Warm-up and Cool-down Performed on first and last piece of equipment    Resistance Training Performed Yes    VAD Patient? No    PAD/SET Patient? No      Pain Assessment   Currently in Pain? No/denies                Social History   Tobacco Use  Smoking Status Former   Packs/day: 1.50   Years: 35.00   Pack years: 52.50   Types: Cigarettes   Quit date: 11/05/2015   Years since quitting: 5.8  Smokeless Tobacco Never    Goals Met:  Independence with exercise equipment Exercise tolerated well No report of concerns or symptoms today Strength training completed today  Goals Unmet:  Not Applicable  Comments: Pt able to follow exercise prescription today without complaint.  Will continue to monitor for progression.    Dr. Emily Filbert is Medical Director for [place].  Dr. Ottie Glazier is Medical Director for [facility] Pulmonary Rehabilitation.

## 2021-08-23 ENCOUNTER — Encounter: Payer: Medicaid Other | Admitting: *Deleted

## 2021-08-23 ENCOUNTER — Other Ambulatory Visit: Payer: Self-pay

## 2021-08-23 DIAGNOSIS — J449 Chronic obstructive pulmonary disease, unspecified: Secondary | ICD-10-CM

## 2021-08-23 NOTE — Progress Notes (Signed)
Daily Session Note  Patient Details  Name: Bailey Maldonado MRN: [id number] Date of Birth: 02/13/1961 Referring Provider:   Flowsheet Row Pulmonary Rehab from 07/24/2021 in [facility] Cardiac and Pulmonary Rehab  Referring Provider Willy Eddy MD       Encounter Date: 08/23/2021  Check In:  Session Check In - 08/23/21 1600       Check-In   Supervising physician immediately available to respond to emergencies See telemetry face sheet for immediately available ER MD    Location [facility]-Cardiac & Pulmonary Rehab    Staff Present Renita Papa, RN BSN;Joseph [place], RCP,RRT,BSRT;Jessica [place], [place], RCEP, CCRP, CCET    Virtual Visit No    Medication changes reported     No    Fall or balance concerns reported    No    Warm-up and Cool-down Performed on first and last piece of equipment    Resistance Training Performed Yes    VAD Patient? No    PAD/SET Patient? No      Pain Assessment   Currently in Pain? No/denies                Social History   Tobacco Use  Smoking Status Former   Packs/day: 1.50   Years: 35.00   Pack years: 52.50   Types: Cigarettes   Quit date: 11/05/2015   Years since quitting: 5.8  Smokeless Tobacco Never    Goals Met:  Independence with exercise equipment Exercise tolerated well No report of concerns or symptoms today Strength training completed today  Goals Unmet:  Not Applicable  Comments: Pt able to follow exercise prescription today without complaint.  Will continue to monitor for progression.    Dr. Emily Filbert is Medical Director for [place].  Dr. Ottie Glazier is Medical Director for [facility] Pulmonary Rehabilitation.

## 2021-08-25 DIAGNOSIS — K8309 Other cholangitis: Principal | ICD-10-CM

## 2021-08-25 MED ORDER — SPIRONOLACTONE 100 MG TABLET
ORAL_TABLET | ORAL | 0 refills | 0 days
Start: 2021-08-25 — End: ?

## 2021-08-27 ENCOUNTER — Other Ambulatory Visit: Payer: Self-pay

## 2021-08-27 DIAGNOSIS — J449 Chronic obstructive pulmonary disease, unspecified: Secondary | ICD-10-CM

## 2021-08-27 MED ORDER — SPIRONOLACTONE 100 MG TABLET
ORAL_TABLET | ORAL | 0 refills | 0 days | Status: CP
Start: 2021-08-27 — End: 2022-08-27

## 2021-08-27 NOTE — Progress Notes (Signed)
Daily Session Note  Patient Details  Name: Bailey Maldonado MRN: [id number] Date of Birth: [birth year] Referring Provider:   Flowsheet Row Pulmonary Rehab from 07/24/2021 in [facility] Cardiac and Pulmonary Rehab  Referring Provider Willy Eddy MD       Encounter Date: 08/27/2021  Check In:  Session Check In - 08/27/21 1622       Check-In   Supervising physician immediately available to respond to emergencies See telemetry face sheet for immediately available ER MD    Location [facility]-Cardiac & Pulmonary Rehab    Staff Present Birdie Sons, MPA, Nino Glow, MS, ASCM CEP, Exercise Physiologist;Joseph Tessie Fass, [place]    Virtual Visit No    Medication changes reported     No    Fall or balance concerns reported    No    Warm-up and Cool-down Performed on first and last piece of equipment    Resistance Training Performed Yes    VAD Patient? No    PAD/SET Patient? No      Pain Assessment   Currently in Pain? No/denies                Social History   Tobacco Use  Smoking Status Former   Packs/day: 1.50   Years: 35.00   Pack years: 52.50   Types: Cigarettes   Quit date: 11/05/2015   Years since quitting: 5.8  Smokeless Tobacco Never    Goals Met:  Independence with exercise equipment Exercise tolerated well No report of concerns or symptoms today Strength training completed today  Goals Unmet:  Not Applicable  Comments: Pt able to follow exercise prescription today without complaint.  Will continue to monitor for progression.    Dr. Emily Filbert is Medical Director for [place].  Dr. Ottie Glazier is Medical Director for [facility] Pulmonary Rehabilitation.

## 2021-08-29 ENCOUNTER — Other Ambulatory Visit: Payer: Self-pay

## 2021-08-29 DIAGNOSIS — J449 Chronic obstructive pulmonary disease, unspecified: Secondary | ICD-10-CM | POA: Diagnosis not present

## 2021-08-29 NOTE — Progress Notes (Signed)
Daily Session Note  Patient Details  Name: Paden Kuras MRN: [id number] Date of Birth: 05-Oct-1961 Referring Provider:   Flowsheet Row Pulmonary Rehab from 07/24/2021 in [facility] Cardiac and Pulmonary Rehab  Referring Provider Willy Eddy MD       Encounter Date: 08/29/2021  Check In:  Session Check In - 08/29/21 1556       Check-In   Supervising physician immediately available to respond to emergencies See telemetry face sheet for immediately available ER MD    Location [facility]-Cardiac & Pulmonary Rehab    Staff Present Birdie Sons, MPA, RN;Joseph Lou Miner, MS, ASCM CEP, Exercise Physiologist    Virtual Visit No    Medication changes reported     No    Fall or balance concerns reported    No    Warm-up and Cool-down Performed on first and last piece of equipment    Resistance Training Performed Yes    VAD Patient? No    PAD/SET Patient? No      Pain Assessment   Currently in Pain? No/denies                Social History   Tobacco Use  Smoking Status Former   Packs/day: 1.50   Years: 35.00   Pack years: 52.50   Types: Cigarettes   Quit date: 11/05/2015   Years since quitting: 5.8  Smokeless Tobacco Never    Goals Met:  Independence with exercise equipment Exercise tolerated well No report of concerns or symptoms today Strength training completed today  Goals Unmet:  Not Applicable  Comments: Pt able to follow exercise prescription today without complaint.  Will continue to monitor for progression.    Dr. Emily Filbert is Medical Director for [place].  Dr. Ottie Glazier is Medical Director for [facility] Pulmonary Rehabilitation.

## 2021-08-30 ENCOUNTER — Encounter: Payer: Medicaid Other | Admitting: *Deleted

## 2021-08-30 DIAGNOSIS — J449 Chronic obstructive pulmonary disease, unspecified: Secondary | ICD-10-CM | POA: Diagnosis not present

## 2021-08-30 NOTE — Progress Notes (Signed)
Daily Session Note  Patient Details  Name: Bailey Maldonado MRN: [id number] Date of Birth: June 11, 1961 Referring Provider:   Flowsheet Row Pulmonary Rehab from 07/24/2021 in [facility] Card[facility] Rehab  Referring Provider Willy Eddy MD       Encounter Date: 08/30/2021  Check In:  Session Check In - 08/30/21 1537       Check-In   Supervising physician immediately available to respond to emergencies See telemetry face sheet for immediately available ER MD    Location [facility]-Cardiac & Pulmonary Rehab    Staff Present Renita Papa, RN BSN;Joseph [place], RCP,RRT,BSRT;Jessica Al[place], [place], RCEP, CCRP, CCET    Virtual Visit No    Medication changes reported     No    Fall or balance concerns reported    No    Warm-up and Cool-down Performed on first and last piece of equipment    Resistance Training Performed Yes    VAD Patient? No    PAD/SET Patient? No      Pain Assessment   Currently in Pain? No/denies                Social History   Tobacco Use  Smoking Status Former   Packs/day: 1.50   Years: 35.00   Pack years: 52.50   Types: Cigarettes   Quit date: 11/05/2015   Years since quitting: 5.8  Smokeless Tobacco Never    Goals Met:  Independence with exercise equipment Exercise tolerated well No report of concerns or symptoms today Strength training completed today  Goals Unmet:  Not Applicable  Comments: Pt able to follow exercise prescription today without complaint.  Will continue to monitor for progression.    Dr. Emily Filbert is Medical Director for [facility].  Dr. Ottie Glazier is Medical Director for [facility] Pulmonary Rehabilitation.

## 2021-09-05 ENCOUNTER — Encounter: Payer: Self-pay | Admitting: *Deleted

## 2021-09-05 ENCOUNTER — Encounter: Payer: Medicaid Other | Attending: Pulmonary Disease | Admitting: *Deleted

## 2021-09-05 ENCOUNTER — Other Ambulatory Visit: Payer: Self-pay

## 2021-09-05 DIAGNOSIS — J449 Chronic obstructive pulmonary disease, unspecified: Secondary | ICD-10-CM | POA: Insufficient documentation

## 2021-09-05 NOTE — Progress Notes (Signed)
Daily Session Note  Patient Details  Name: Bailey Maldonado MRN: [id number] Date of Birth: 1961/06/09 Referring Provider:   Flowsheet Row Pulmonary Rehab from 07/24/2021 in [facility] Cardiac and Pulmonary Rehab  Referring Provider Willy Eddy MD       Encounter Date: 09/05/2021  Check In:  Session Check In - 09/05/21 1745       Check-In   Supervising physician immediately available to respond to emergencies See telemetry face sheet for immediately available ER MD    Location [facility]-Cardiac & Pulmonary Rehab    Staff Present Nyoka Cowden, RN, BSN, Ardeth Sportsman, RDN, Tawanna Solo, MS, ASCM CEP, Exercise Physiologist;Susanne Bice, RN, BSN, CCRP    Virtual Visit No    Medication changes reported     No    Fall or balance concerns reported    No    Tobacco Cessation No Change    Warm-up and Cool-down Performed on first and last piece of equipment    Resistance Training Performed Yes    VAD Patient? No    PAD/SET Patient? No      Pain Assessment   Currently in Pain? No/denies                Social History   Tobacco Use  Smoking Status Former   Packs/day: 1.50   Years: 35.00   Pack years: 52.50   Types: Cigarettes   Quit date: 11/05/2015   Years since quitting: 5.8  Smokeless Tobacco Never    Goals Met:  Independence with exercise equipment Exercise tolerated well No report of concerns or symptoms today  Goals Unmet:  Not Applicable  Comments: Pt able to follow exercise prescription today without complaint.  Will continue to monitor for progression.    Dr. Emily Filbert is Medical Director for [employer].  Dr. Ottie Glazier is Medical Director for [facility] Pulmonary Rehabilitation.

## 2021-09-05 NOTE — Progress Notes (Signed)
Pulmonary Individual Treatment Plan  Patient Details  Name: Bailey Maldonado MRN: [id number] Date of Birth: 1961/06/08 Referring Provider:   Flowsheet Row Pulmonary Rehab from 07/24/2021 in [facility] Cardiac and Pulmonary Rehab  Referring Provider Willy Eddy MD       Initial Encounter Date:  Flowsheet Row Pulmonary Rehab from 07/24/2021 in [facility] Cardiac and Pulmonary Rehab  Date 07/24/21       Visit Diagnosis: Chronic obstructive pulmonary disease, unspecified COPD type ([place])  Patient's Home Medications on Admission:  Current Outpatient Medications:    albuterol (PROVENTIL) (2.5 MG/3ML) 0.083% nebulizer solution, Inhale into the lungs., Disp: , Rfl:    albuterol (VENTOLIN HFA) 108 (90 Base) MCG/ACT inhaler, Inhale into the lungs., Disp: , Rfl:    clobetasol (TEMOVATE) 0.05 % external solution, Apply topically 2 (two) times daily as needed., Disp: , Rfl:    fluticasone (FLONASE) 50 MCG/ACT nasal spray, 2 sprays into each nostril two (2) times a day., Disp: , Rfl:    fluticasone-salmeterol (ADVAIR) 250-50 MCG/ACT AEPB, Inhale into the lungs., Disp: , Rfl:    hydrOXYzine (ATARAX/VISTARIL) 25 MG tablet, Take 25 mg by mouth in the morning and at bedtime., Disp: , Rfl:    Multiple Vitamin (MULTIVITAMIN) tablet, Take 1 tablet by mouth daily., Disp: , Rfl:    pramipexole (MIRAPEX) 0.125 MG tablet, Take by mouth., Disp: , Rfl:    spironolactone (ALDACTONE) 100 MG tablet, Take 100 mg by mouth daily., Disp: , Rfl:    Tiotropium Bromide Monohydrate (SPIRIVA RESPIMAT) 2.5 MCG/ACT AERS, Inhale into the lungs., Disp: , Rfl:    ursodiol (ACTIGALL) 300 MG capsule, Take by mouth., Disp: , Rfl:   Past Medical History: No past medical history on file.  Tobacco Use: Social History   Tobacco Use  Smoking Status Former   Packs/day: 1.50   Years: 35.00   Pack years: 52.50   Types: Cigarettes   Quit date: 11/05/2015   Years since quitting: 5.8  Smokeless Tobacco Never    Labs: Recent Review  Flowsheet Data   There is no flowsheet data to display.      Pulmonary Assessment Scores:  Pulmonary Assessment Scores     Row Name 07/24/21 [id number] 07/30/21 1654       ADL UCSD   ADL Phase Entry --    SOB Score total 21 --    Rest 0 --    Walk 1 --    Stairs 5 --    Bath 0 --    Dress 0 --    Shop 1 --      CAT Score   CAT Score 11 --      mMRC Score   mMRC Score -- 1             UCSD: Self-administered rating of dyspnea associated with activities of daily living (ADLs) 6-point scale (0 = "not at all" to 5 = "maximal or unable to do because of breathlessness")  Scoring Scores range from 0 to 120.  Minimally important difference is 5 units  CAT: CAT can identify the health impairment of COPD patients and is better correlated with disease progression.  CAT has a scoring range of zero to 40. The CAT score is classified into four groups of low (less than 10), medium (10 - 20), high (21-30) and very high (31-40) based on the impact level of disease on health status. A CAT score over 10 suggests significant symptoms.  A worsening CAT score could be explained by an exacerbation,  poor medication adherence, poor inhaler technique, or progression of COPD or comorbid conditions.  CAT MCID is 2 points  mMRC: mMRC (Modified Medical Research Council) Dyspnea Scale is used to assess the degree of baseline functional disability in patients of respiratory disease due to dyspnea. No minimal important difference is established. A decrease in score of 1 point or greater is considered a positive change.   Pulmonary Function Assessment:   Exercise Target Goals: Exercise Program Goal: Individual exercise prescription set using results from initial 6 min walk test and THRR while considering  patient's activity barriers and safety.   Exercise Prescription Goal: Initial exercise prescription builds to 30-45 minutes a day of aerobic activity, 2-3 days per week.  Home exercise guidelines  will be given to patient during program as part of exercise prescription that the participant will acknowledge.  Education: Aerobic Exercise: - Group verbal and visual presentation on the components of exercise prescription. Introduces F.I.T.T principle from ACSM for exercise prescriptions.  Reviews F.I.T.T. principles of aerobic exercise including progression. Written material given at graduation.   Education: Resistance Exercise: - Group verbal and visual presentation on the components of exercise prescription. Introduces F.I.T.T principle from ACSM for exercise prescriptions  Reviews F.I.T.T. principles of resistance exercise including progression. Written material given at graduation.    Education: Exercise & Equipment Safety: - Individual verbal instruction and demonstration of equipment use and safety with use of the equipment. Flowsheet Row Pulmonary Rehab from 08/22/2021 in [facility] and Pulmonary Rehab  Education need identified 07/24/21  Date 07/24/21  Educator [place]  Instruction Review Code 1- Verbalizes Understanding       Education: Exercise Physiology & General Exercise Guidelines: - Group verbal and written instruction with models to review the exercise physiology of the cardiovascular system and associated critical values. Provides general exercise guidelines with specific guidelines to those with heart or lung disease.  Flowsheet Row Pulmonary Rehab from 08/22/2021 in [facility] Cardiac and Pulmonary Rehab  Education need identified 07/24/21  Date 08/22/21  Educator [place]  Instruction Review Code 1- Verbalizes Understanding       Education: Flexibility, Balance, Mind/Body Relaxation: - Group verbal and visual presentation with interactive activity on the components of exercise prescription. Introduces F.I.T.T principle from ACSM for exercise prescriptions. Reviews F.I.T.T. principles of flexibility and balance exercise training including progression. Also discusses the mind  body connection.  Reviews various relaxation techniques to help reduce and manage stress (i.e. Deep breathing, progressive muscle relaxation, and visualization). Balance handout provided to take home. Written material given at graduation.   Activity Barriers & Risk Stratification:  Activity Barriers & Cardiac Risk Stratification - 07/24/21 0955       Activity Barriers & Cardiac Risk Stratification   Activity Barriers Shortness of Breath;Deconditioning             6 Minute Walk:  6 Minute Walk     Row Name 07/24/21 0954         6 Minute Walk   Phase Initial     Distance 1300 feet     Walk Time 6 minutes     # of Rest Breaks 0     MPH 2.46     METS 3.44     RPE 7     Perceived Dyspnea  1     VO2 Peak 12.06     Symptoms Yes (comment)     Comments Slight SOB     Resting HR 80 bpm     Resting BP 102/62  Resting Oxygen Saturation  97 %     Exercise Oxygen Saturation  during 6 min walk 89 %     Max Ex. HR 106 bpm     Max Ex. BP 112/66     2 Minute Post BP 100/62       Interval HR   1 Minute HR 97     2 Minute HR 102     3 Minute HR 103     4 Minute HR 105     5 Minute HR 106     6 Minute HR 106     2 Minute Post HR 86     Interval Heart Rate? Yes       Interval Oxygen   Interval Oxygen? Yes     Baseline Oxygen Saturation % 97 %     1 Minute Oxygen Saturation % 94 %     1 Minute Liters of Oxygen 0 L  RA     2 Minute Oxygen Saturation % 93 %     2 Minute Liters of Oxygen 0 L     3 Minute Oxygen Saturation % 91 %     3 Minute Liters of Oxygen 0 L     4 Minute Oxygen Saturation % 89 %     4 Minute Liters of Oxygen 0 L     5 Minute Oxygen Saturation % 90 %     5 Minute Liters of Oxygen 0 L     6 Minute Oxygen Saturation % 90 %     6 Minute Liters of Oxygen 0 L     2 Minute Post Oxygen Saturation % 93 %     2 Minute Post Liters of Oxygen 0 L             Oxygen Initial Assessment:  Oxygen Initial Assessment - 07/24/21 0938       Home Oxygen   Home  Oxygen Device None    Sleep Oxygen Prescription None    Home Exercise Oxygen Prescription None    Home Resting Oxygen Prescription None      Initial 6 min Walk   Oxygen Used None      Program Oxygen Prescription   Program Oxygen Prescription None      Intervention   Short Term Goals To learn and demonstrate proper pursed lip breathing techniques or other breathing techniques. ;To learn and demonstrate proper use of respiratory medications;To learn and understand importance of maintaining oxygen saturations>88%;To learn and understand importance of monitoring SPO2 with pulse oximeter and demonstrate accurate use of the pulse oximeter.;To learn and exhibit compliance with exercise, home and travel O2 prescription    Long  Term Goals Exhibits proper breathing techniques, such as pursed lip breathing or other method taught during program session;Compliance with respiratory medication;Demonstrates proper use of MDI's;Maintenance of O2 saturations>88%;Verbalizes importance of monitoring SPO2 with pulse oximeter and return demonstration;Exhibits compliance with exercise, home  and travel O2 prescription             Oxygen Re-Evaluation:  Oxygen Re-Evaluation     Row Name 08/09/21 1602             Program Oxygen Prescription   Program Oxygen Prescription None         Home Oxygen   Home Oxygen Device None       Sleep Oxygen Prescription None       Home Exercise Oxygen Prescription None       Home Resting Oxygen  Prescription None       Compliance with Home Oxygen Use Yes         Goals/Expected Outcomes   Short Term Goals Other       Long  Term Goals Other       Comments Spoke to patient about COPD Action Plan. Reviewed and talked with the patient about the different levels of COPD severity that they should review daily and the steps they can take to manage their COPD. Went over pertinent actions for the patient can take when they feel like they are having a COPD exacerbation and the  necessary treatment if needed. If patient has any changes with their breathing or feel like they are in a different zone of severity to inform staff for re-evaluation. Patient verbalizes understanding. Copy given to patient.       Goals/Expected Outcomes Short: Follow COPD action plan. Long: Report any changes and severity of COPD.                Oxygen Discharge (Final Oxygen Re-Evaluation):  Oxygen Re-Evaluation - 08/09/21 1602       Program Oxygen Prescription   Program Oxygen Prescription None      Home Oxygen   Home Oxygen Device None    Sleep Oxygen Prescription None    Home Exercise Oxygen Prescription None    Home Resting Oxygen Prescription None    Compliance with Home Oxygen Use Yes      Goals/Expected Outcomes   Short Term Goals Other    Long  Term Goals Other    Comments Spoke to patient about COPD Action Plan. Reviewed and talked with the patient about the different levels of COPD severity that they should review daily and the steps they can take to manage their COPD. Went over pertinent actions for the patient can take when they feel like they are having a COPD exacerbation and the necessary treatment if needed. If patient has any changes with their breathing or feel like they are in a different zone of severity to inform staff for re-evaluation. Patient verbalizes understanding. Copy given to patient.    Goals/Expected Outcomes Short: Follow COPD action plan. Long: Report any changes and severity of COPD.             Initial Exercise Prescription:  Initial Exercise Prescription - 07/24/21 1000       Date of Initial Exercise RX and Referring Provider   Date 07/24/21    Referring Provider Willy Eddy MD      Treadmill   MPH 2.6    Grade 0.5    Minutes 15    METs 3.17      Recumbant Bike   Level 3    RPM 60    Watts 30    Minutes 15    METs 3.4      NuStep   Level 3    SPM 80    Minutes 15    METs 3.4      REL-XR   Level 2    Speed 50     Minutes 15    METs 3.4      Prescription Details   Frequency (times per week) 3    Duration Progress to 30 minutes of continuous aerobic without signs/symptoms of physical distress      Intensity   THRR 40-80% of Max Heartrate 113-146    Ratings of Perceived Exertion 11-13    Perceived Dyspnea 0-4      Progression  Progression Continue to progress workloads to maintain intensity without signs/symptoms of physical distress.      Resistance Training   Training Prescription Yes    Weight 3 lb    Reps 10-15             Perform Capillary Blood Glucose checks as needed.  Exercise Prescription Changes:   Exercise Prescription Changes     Row Name 07/24/21 1000 08/06/21 1600 08/22/21 1600 09/03/21 1200       Response to Exercise   Blood Pressure (Admit) 102/62 102/60 132/56 112/60    Blood Pressure (Exercise) 112/66 108/60 128/70 --    Blood Pressure (Exit) 100/62 108/68 104/62 126/64    Heart Rate (Admit) 80 bpm 89 bpm 70 bpm 86 bpm    Heart Rate (Exercise) 106 bpm 109 bpm 111 bpm 109 bpm    Heart Rate (Exit) 94 bpm 103 bpm 80 bpm 90 bpm    Oxygen Saturation (Admit) 97 % 96 % 95 % 96 %    Oxygen Saturation (Exercise) 89 % 92 % 93 % 94 %    Oxygen Saturation (Exit) 96 % 94 % 94 % 96 %    Rating of Perceived Exertion (Exercise) 7 12 13 15     Perceived Dyspnea (Exercise) 1 2 3 2     Symptoms Slight SOB SOB SOB SOB    Comments walk test results -- -- --    Duration -- Progress to 30 minutes of  aerobic without signs/symptoms of physical distress Continue with 30 min of aerobic exercise without signs/symptoms of physical distress. Continue with 30 min of aerobic exercise without signs/symptoms of physical distress.    Intensity -- THRR unchanged THRR unchanged THRR unchanged      Progression   Progression -- Continue to progress workloads to maintain intensity without signs/symptoms of physical distress. Continue to progress workloads to maintain intensity without  signs/symptoms of physical distress. Continue to progress workloads to maintain intensity without signs/symptoms of physical distress.    Average METs -- 3.42 3.4 3.19      Resistance Training   Training Prescription -- Yes Yes Yes    Weight -- 3 lb 5 lb 5 lb    Reps -- 10-15 10-15 10-15      Interval Training   Interval Training -- No No No      Treadmill   MPH -- 2.6 2.6 2.6    Grade -- 0.5 1 1     Minutes -- 15 15 15     METs -- 3.17 3.35 3.35      Recumbant Bike   Level -- -- -- 5    Watts -- -- -- 19    Minutes -- -- -- 15      NuStep   Level -- 3 3 3     Minutes -- 15 15 15     METs -- 3.1 3.5 2.5      REL-XR   Level -- 2 -- 3    Minutes -- 15 -- 15    METs -- 4 -- --      Oxygen   Maintain Oxygen Saturation -- 88% or higher 88% or higher 88% or higher             Exercise Comments:   Exercise Comments     Row Name 07/30/21 1603           Exercise Comments First full day of exercise!  Patient was oriented to gym and equipment including functions, settings, policies, and  procedures.  Patient's individual exercise prescription and treatment plan were reviewed.  All starting workloads were established based on the results of the 6 minute walk test done at initial orientation visit.  The plan for exercise progression was also introduced and progression will be customized based on patient's performance and goals.                Exercise Goals and Review:   Exercise Goals     Row Name 07/24/21 1009             Exercise Goals   Increase Physical Activity Yes       Intervention Provide advice, education, support and counseling about physical activity/exercise needs.;Develop an individualized exercise prescription for aerobic and resistive training based on initial evaluation findings, risk stratification, comorbidities and participant's personal goals.       Expected Outcomes Short Term: Attend rehab on a regular basis to increase amount of physical  activity.;Long Term: Add in home exercise to make exercise part of routine and to increase amount of physical activity.;Long Term: Exercising regularly at least 3-5 days a week.       Increase Strength and Stamina Yes       Intervention Develop an individualized exercise prescription for aerobic and resistive training based on initial evaluation findings, risk stratification, comorbidities and participant's personal goals.;Provide advice, education, support and counseling about physical activity/exercise needs.       Expected Outcomes Short Term: Increase workloads from initial exercise prescription for resistance, speed, and METs.;Short Term: Perform resistance training exercises routinely during rehab and add in resistance training at home;Long Term: Improve cardiorespiratory fitness, muscular endurance and strength as measured by increased METs and functional capacity (6MWT)       Able to understand and use rate of perceived exertion (RPE) scale Yes       Intervention Provide education and explanation on how to use RPE scale       Expected Outcomes Short Term: Able to use RPE daily in rehab to express subjective intensity level;Long Term:  Able to use RPE to guide intensity level when exercising independently       Able to understand and use Dyspnea scale Yes       Intervention Provide education and explanation on how to use Dyspnea scale       Expected Outcomes Short Term: Able to use Dyspnea scale daily in rehab to express subjective sense of shortness of breath during exertion;Long Term: Able to use Dyspnea scale to guide intensity level when exercising independently       Knowledge and understanding of Target Heart Rate Range (THRR) Yes       Intervention Provide education and explanation of THRR including how the numbers were predicted and where they are located for reference       Expected Outcomes Short Term: Able to state/look up THRR;Long Term: Able to use THRR to govern intensity when  exercising independently;Short Term: Able to use daily as guideline for intensity in rehab       Able to check pulse independently Yes       Intervention Provide education and demonstration on how to check pulse in carotid and radial arteries.;Review the importance of being able to check your own pulse for safety during independent exercise       Expected Outcomes Short Term: Able to explain why pulse checking is important during independent exercise;Long Term: Able to check pulse independently and accurately       Understanding of  Exercise Prescription Yes       Intervention Provide education, explanation, and written materials on patient's individual exercise prescription       Expected Outcomes Short Term: Able to explain program exercise prescription;Long Term: Able to explain home exercise prescription to exercise independently                Exercise Goals Re-Evaluation :  Exercise Goals Re-Evaluation     Row Name 07/30/21 1603 08/06/21 1609 08/22/21 1622 09/03/21 1239       Exercise Goal Re-Evaluation   Exercise Goals Review Increase Physical Activity;Able to understand and use rate of perceived exertion (RPE) scale;Knowledge and understanding of Target Heart Rate Range (THRR);Understanding of Exercise Prescription;Increase Strength and Stamina;Able to understand and use Dyspnea scale;Able to check pulse independently Increase Physical Activity;Increase Strength and Stamina;Understanding of Exercise Prescription Increase Physical Activity;Increase Strength and Stamina Increase Physical Activity;Increase Strength and Stamina    Comments Reviewed RPE and dyspnea scales, THR and program prescription with pt today.  Pt voiced understanding and was given a copy of goals to take home. Dawn is off to a good start in rehab.  She is on her fourth full day of exercise today.  We will continue to monitor her progress. Dawn is progressing well and has increased incline on TM.  She has moved up to 5  lb for strength work.  We will continue to monitor progress. Iyanni is continuing to do well.  She has reached up to level 5 on the recumbant bike. Oxygen and RPEs are within appropriate ranges. Will continue to monitor.    Expected Outcomes Short: Use RPE daily to regulate intensity. Long: Follow program prescription in THR. Short: Continue to attend rehab regularly Long: Continue to follow program prescription Short: reach THR range Long: build overall stamina Short: Continue to increase loads on the treadmill Long: Continue to increase overall MET level             Discharge Exercise Prescription (Final Exercise Prescription Changes):  Exercise Prescription Changes - 09/03/21 1200       Response to Exercise   Blood Pressure (Admit) 112/60    Blood Pressure (Exit) 126/64    Heart Rate (Admit) 86 bpm    Heart Rate (Exercise) 109 bpm    Heart Rate (Exit) 90 bpm    Oxygen Saturation (Admit) 96 %    Oxygen Saturation (Exercise) 94 %    Oxygen Saturation (Exit) 96 %    Rating of Perceived Exertion (Exercise) 15    Perceived Dyspnea (Exercise) 2    Symptoms SOB    Duration Continue with 30 min of aerobic exercise without signs/symptoms of physical distress.    Intensity THRR unchanged      Progression   Progression Continue to progress workloads to maintain intensity without signs/symptoms of physical distress.    Average METs 3.19      Resistance Training   Training Prescription Yes    Weight 5 lb    Reps 10-15      Interval Training   Interval Training No      Treadmill   MPH 2.6    Grade 1    Minutes 15    METs 3.35      Recumbant Bike   Level 5    Watts 19    Minutes 15      NuStep   Level 3    Minutes 15    METs 2.5      REL-XR  Level 3    Minutes 15      Oxygen   Maintain Oxygen Saturation 88% or higher             Nutrition:  Target Goals: Understanding of nutrition guidelines, daily intake of sodium <1581m, cholesterol <2080m calories 30%  from fat and 7% or less from saturated fats, daily to have 5 or more servings of fruits and vegetables.  Education: All About Nutrition: -Group instruction provided by verbal, written material, interactive activities, discussions, models, and posters to present general guidelines for heart healthy nutrition including fat, fiber, MyPlate, the role of sodium in heart healthy nutrition, utilization of the nutrition label, and utilization of this knowledge for meal planning. Follow up email sent as well. Written material given at graduation. [facility]  Education need identified 07/24/21       Biometrics:  Pre Biometrics - 07/24/21 0943       Pre Biometrics   Height 5' 6.1" (1.679 m)    Weight 176 lb 9.6 oz (80.1 kg)    BMI (Calculated) 28.42    Single Leg Stand 9.31 seconds              Nutrition Therapy Plan and Nutrition Goals:  Nutrition Therapy & Goals - 08/20/21 1658       Nutrition Therapy   Diet Pulmonary MNT, Heart healthy, low Na    Protein (specify units) 95g    Fiber 25 grams    Whole Grain Foods 3 servings    Saturated Fats 12 max. grams    Fruits and Vegetables 8 servings/day    Sodium 1.5 grams      Personal Nutrition Goals   Nutrition Goal ST: include protein at each meal: examples: egg at breakfast, beans with vegetables when no meat for dinner, add snack likt peanut butter with fruit LT: meet protein needs, avoid feeling hungry throughout the day    Comments She reports having a good apetite. She enjoys fruits and vegetables. B: bowl of grits, or bagel before she gets running for the day L: no lunch or sandwich (meat sandwich) (white bread) D: she feels that she overeats and that she is very hungry. Vegetables (a variety including greens) she doesn't have much meat. She cooks fries in the [place] the man she lives with will complain - he doesn't like the healthy foods she makes. She  reports having $900 a month for everything. Discussed general healthy eating, protein sources, and pulmonary MNT. She also has cirrhosis of the liver in addition to COPD, discussed MNT.      Intervention Plan   Intervention Prescribe, educate and counsel regarding individualized specific dietary modifications aiming towards targeted core components such as weight, hypertension, lipid management, diabetes, heart failure and other comorbidities.    Expected Outcomes Short Term Goal: Understand basic principles of dietary content, such as calories, fat, sodium, cholesterol and nutrients.;Short Term Goal: A plan has been developed with personal nutrition goals set during dietitian appointment.;Long Term Goal: Adherence to prescribed nutrition plan.             Nutrition Assessments:  MEDIFICTS Score Key: ?70 Need to make dietary changes  40-70 Heart Healthy Diet ? 40 Therapeutic Level Cholesterol Diet  [facility]  Picture Your Plate Total Score on Admission 59      Picture Your Plate Scores: <4<91nhealthy dietary pattern with  much room for improvement. 41-50 Dietary pattern unlikely to meet recommendations for good health and room for improvement. 51-60 More healthful dietary pattern, with some room for improvement.  >60 Healthy dietary pattern, although there may be some specific behaviors that could be improved.   Nutrition Goals Re-Evaluation:  Nutrition Goals Re-Evaluation     [place] Name 08/16/21 [place]             Goals   Nutrition Goal Meet with the dietician       Comment Patient was informed on why it is important to maintain a balanced diet when dealing with Respiratory issues. Explained that it takes a lot of energy to breath and when they are short of breath often they will need to have a good diet to help keep up with the calories they are expending for breathing.       Expected Outcome Short: Choose  and plan snacks accordingly to patients caloric intake to improve breathing. Long: Maintain a diet independently that meets their caloric intake to aid in daily shortness of breath.                Nutrition Goals Discharge (Final Nutrition Goals Re-Evaluation):  Nutrition Goals Re-Evaluation - 08/16/21 1550       Goals   Nutrition Goal Meet with the dietician    Comment Patient was informed on why it is important to maintain a balanced diet when dealing with Respiratory issues. Explained that it takes a lot of energy to breath and when they are short of breath often they will need to have a good diet to help keep up with the calories they are expending for breathing.    Expected Outcome Short: Choose and plan snacks accordingly to patients caloric intake to improve breathing. Long: Maintain a diet independently that meets their caloric intake to aid in daily shortness of breath.             Psychosocial: Target Goals: Acknowledge presence or absence of significant depression and/or stress, maximize coping skills, provide positive support system. Participant is able to verbalize types and ability to use techniques and skills needed for reducing stress and depression.   Education: Stress, Anxiety, and Depression - Group verbal and visual presentation to define topics covered.  Reviews how body is impacted by stress, anxiety, and depression.  Also discusses healthy ways to reduce stress and to treat/manage anxiety and depression.  Written material given at graduation. Flowsheet Row Pulmonary Rehab from 08/22/2021 in [facility] Cardiac and Pulmonary Rehab  Date 08/15/21  Educator AS  Instruction Review Code 1- Verbalizes Understanding       Education: Sleep Hygiene -Provides group verbal and written instruction about how sleep can affect your health.  Define sleep hygiene, discuss sleep cycles and impact of sleep habits. Review good sleep hygiene tips.    Initial Review & Psychosocial  Screening:  Initial Psych Review & Screening - 07/17/21 1139       Initial Review   Current issues with Current Stress Concerns    Source of Stress Concerns Family    Comments Husband  demntia and in facility      [place]? Yes   daughter   close friend     Barriers   Psychosocial barriers to participate in program There are no identifiable barriers or psychosocial needs.;The patient should benefit from training in stress management and relaxation.      Screening Interventions   Interventions Encouraged to  exercise;To provide support and resources with identified psychosocial needs;Provide feedback about the scores to participant    Expected Outcomes Short Term goal: Utilizing psychosocial counselor, staff and physician to assist with identification of specific Stressors or current issues interfering with healing process. Setting desired goal for each stressor or current issue identified.;Long Term Goal: Stressors or current issues are controlled or eliminated.;Short Term goal: Identification and review with participant of any Quality of Life or Depression concerns found by scoring the questionnaire.;Long Term goal: The participant improves quality of Life and PHQ9 Scores as seen by post scores and/or verbalization of changes             Quality of Life Scores:  Scores of 19 and below usually indicate a poorer quality of life in these areas.  A difference of  2-3 points is a clinically meaningful difference.  A difference of 2-3 points in the total score of the Quality of Life Index has been associated with significant improvement in overall quality of life, self-image, physical symptoms, and general health in studies assessing change in quality of life.  PHQ-9: Recent Review Flowsheet Data     Depression screen [facility] 2/9 07/24/2021   Decreased Interest 0   Down, Depressed, Hopeless 0   PHQ - 2 Score 0   Altered sleeping 0   Tired, decreased energy 1    Change in appetite 2   Feeling bad or failure about yourself  0   Trouble concentrating 0   Moving slowly or fidgety/restless 0   Suicidal thoughts 0   PHQ-9 Score 3   Difficult doing work/chores Not difficult at all      Interpretation of Total Score  Total Score Depression Severity:  1-4 = Minimal depression, 5-9 = Mild depression, 10-14 = Moderate depression, 15-19 = Moderately severe depression, 20-27 = Severe depression   Psychosocial Evaluation and Intervention:  Psychosocial Evaluation - 07/17/21 1145       Psychosocial Evaluation & Interventions   Interventions Encouraged to exercise with the program and follow exercise prescription;Relaxation education;Stress management education    Comments Malicia has no barriers to attending the program. She has support system of her daughter and a close friend. She does have significant stress as her husband has dementia and lives in a facility in [place]. She goes to see him daily. She has gained weight since quitting cigarettes in2017. She quit because she wanted to be here for her grandchild that was born on her birthday.  She wants to get back to her weight before she quit smoking.  She is ready to get started.    Expected Outcomes STG: [place] will be able to attend all scheduled sessions, she will gain knowledge to help her reduce her stress. LTG: [place] will continue to use what she learned in the program to help with her stress.    Continue Psychosocial Services  Follow up required by staff             Psychosocial Re-Evaluation:  Psychosocial Re-Evaluation     [place] Name 08/16/21 1555             Psychosocial Re-Evaluation   Current issues with None Identified       Comments Patient reports no issues with their current mental states, sleep, stress, depression or anxiety. Will follow up with patient in a few weeks for any changes.       Expected Outcomes Short: Continue to exercise regularly to support mental health and  notify staff of  any changes. Long: maintain mental health and well being through teaching of rehab or prescribed medications independently.       Interventions Encouraged to attend Pulmonary Rehabilitation for the exercise       Continue Psychosocial Services  Follow up required by staff                Psychosocial Discharge (Final Psychosocial Re-Evaluation):  Psychosocial Re-Evaluation - 08/16/21 1555       Psychosocial Re-Evaluation   Current issues with None Identified    Comments Patient reports no issues with their current mental states, sleep, stress, depression or anxiety. Will follow up with patient in a few weeks for any changes.    Expected Outcomes Short: Continue to exercise regularly to support mental health and notify staff of any changes. Long: maintain mental health and well being through teaching of rehab or prescribed medications independently.    Interventions Encouraged to attend Pulmonary Rehabilitation for the exercise    Continue Psychosocial Services  Follow up required by staff             Education: Education Goals: Education classes will be provided on a weekly basis, covering required topics. Participant will state understanding/return demonstration of topics presented.  Learning Barriers/Preferences:   General Pulmonary Education Topics:  Infection Prevention: - Provides verbal and written material to individual with discussion of infection control including proper hand washing and proper equipment cleaning during exercise session. Flowsheet Row Pulmonary Rehab from 08/22/2021 in [facility] Cardiac and Pulmonary Rehab  Education need identified 07/24/21  Date 07/24/21  Educator [place]  Instruction Review Code 1- Verbalizes Understanding       Falls Prevention: - Provides verbal and written material to individual with discussion of falls prevention and safety. Flowsheet Row Pulmonary Rehab from 08/22/2021 in [facility] Pulmonary Rehab   Education need identified 07/24/21  Date 07/24/21  Educator [place]  Instruction Review Code 1- Verbalizes Understanding       Chronic Lung Disease Review: - Group verbal instruction with posters, models, PowerPoint presentations and videos,  to review new updates, new respiratory medications, new advancements in procedures and treatments. Providing information on websites and "800" numbers for continued self-education. Includes information about supplement oxygen, available portable oxygen systems, continuous and intermittent flow rates, oxygen safety, concentrators, and Medicare reimbursement for oxygen. Explanation of Pulmonary Drugs, including class, frequency, complications, importance of spacers, rinsing mouth after steroid MDI's, and proper cleaning methods for nebulizers. Review of basic lung anatomy and physiology related to function, structure, and complications of lung disease. Review of risk factors. Discussion about methods for diagnosing sleep apnea and types of masks and machines for OSA. Includes a review of the use of types of environmental controls: home humidity, furnaces, filters, dust mite/pet prevention, HEPA vacuums. Discussion about weather changes, air quality and the benefits of nasal washing. Instruction on Warning signs, infection symptoms, calling MD promptly, preventive modes, and value of vaccinations. Review of effective airway clearance, coughing and/or vibration techniques. Emphasizing that all should Create an Action Plan. Written material given at graduation. Flowsheet Row Pulmonary Rehab from 08/22/2021 in [facility] Cardiac and Pulmonary Rehab  Education need identified 07/24/21  Date 08/08/21  Educator [facility]  Instruction Review Code 1- Verbalizes Understanding       AED/CPR: - Group verbal and written instruction with the use of models to demonstrate the basic use of the AED with the basic ABC's of resuscitation.    Anatomy and Cardiac Procedures: - Group verbal  and visual  presentation and models provide information about basic cardiac anatomy and function. Reviews the testing methods done to diagnose heart disease and the outcomes of the test results. Describes the treatment choices: Medical Management, Angioplasty, or Coronary Bypass Surgery for treating various heart conditions including Myocardial Infarction, Angina, Valve Disease, and Cardiac Arrhythmias.  Written material given at graduation.   Medication Safety: - Group verbal and visual instruction to review commonly prescribed medications for heart and lung disease. Reviews the medication, class of the drug, and side effects. Includes the steps to properly store meds and maintain the prescription regimen.  Written material given at graduation.   Other: -Provides group and verbal instruction on various topics (see comments)   Knowledge Questionnaire Score:  Knowledge Questionnaire Score - 07/24/21 0942       Knowledge Questionnaire Score   Pre Score 13/18: HR, ADL, Nutrition, O2              Core Components/Risk Factors/Patient Goals at Admission:  Personal Goals and Risk Factors at Admission - 07/24/21 1009       Core Components/Risk Factors/Patient Goals on Admission    Weight Management Yes;Weight Loss    Intervention Weight Management: Develop a combined nutrition and exercise program designed to reach desired caloric intake, while maintaining appropriate intake of nutrient and fiber, sodium and fats, and appropriate energy expenditure required for the weight goal.;Weight Management: Provide education and appropriate resources to help participant work on and attain dietary goals.;Weight Management/Obesity: Establish reasonable short term and long term weight goals.    Admit Weight 176 lb (79.8 kg)    Goal Weight: Short Term 170 lb (77.1 kg)    Goal Weight: Long Term 160 lb (72.6 kg)    Expected Outcomes Short Term: Continue to assess and modify interventions until short term  weight is achieved;Long Term: Adherence to nutrition and physical activity/exercise program aimed toward attainment of established weight goal;Weight Loss: Understanding of general recommendations for a balanced deficit meal plan, which promotes 1-2 lb weight loss per week and includes a negative energy balance of [phone] kcal/d;Understanding recommendations for meals to include 15-35% energy as protein, 25-35% energy from fat, 35-60% energy from carbohydrates, less than 232m of dietary cholesterol, 20-35 gm of total fiber daily;Understanding of distribution of calorie intake throughout the day with the consumption of 4-5 meals/snacks    Improve shortness of breath with ADL's Yes    Intervention Provide education, individualized exercise plan and daily activity instruction to help decrease symptoms of SOB with activities of daily living.    Expected Outcomes Short Term: Improve cardiorespiratory fitness to achieve a reduction of symptoms when performing ADLs;Long Term: Be able to perform more ADLs without symptoms or delay the onset of symptoms    Increase knowledge of respiratory medications and ability to use respiratory devices properly  Yes    Intervention Provide education and demonstration as needed of appropriate use of medications, inhalers, and oxygen therapy.    Expected Outcomes Short Term: Achieves understanding of medications use. Understands that oxygen is a medication prescribed by physician. Demonstrates appropriate use of inhaler and oxygen therapy.;Long Term: Maintain appropriate use of medications, inhalers, and oxygen therapy.             Education:Diabetes - Individual verbal and written instruction to review signs/symptoms of diabetes, desired ranges of glucose level fasting, after meals and with exercise. Acknowledge that pre and post exercise glucose checks will be done for 3 sessions at entry of program.   Know Your Numbers  and Heart Failure: - Group verbal and visual  instruction to discuss disease risk factors for cardiac and pulmonary disease and treatment options.  Reviews associated critical values for Overweight/Obesity, Hypertension, Cholesterol, and Diabetes.  Discusses basics of heart failure: signs/symptoms and treatments.  Introduces Heart Failure Zone chart for action plan for heart failure.  Written material given at graduation. Flowsheet Row Pulmonary Rehab from 08/22/2021 in [facility] Cardiac and Pulmonary Rehab  Date 08/01/21  Educator [facility]  Instruction Review Code 1- Verbalizes Understanding       Core Components/Risk Factors/Patient Goals Review:   Goals and Risk Factor Review     [place] Name 08/16/21 1549             Core Components/Risk Factors/Patient Goals Review   Personal Goals Review Improve shortness of breath with ADL's       Review Spoke to patient about their shortness of breath and what they can do to improve. Patient has been informed of breathing techniques when starting the program. Patient is informed to tell staff if they have had any med changes and that certain meds they are taking or not taking can be causing shortness of breath.       Expected Outcomes Short: Attend [facility] regularly to improve shortness of breath with ADL's. Long: maintain independence with ADL's                Core Components/Risk Factors/Patient Goals at Discharge (Final Review):   Goals and Risk Factor Review - 08/16/21 1549       Core Components/Risk Factors/Patient Goals Review   Personal Goals Review Improve shortness of breath with ADL's    Review Spoke to patient about their shortness of breath and what they can do to improve. Patient has been informed of breathing techniques when starting the program. Patient is informed to tell staff if they have had any med changes and that certain meds they are taking or not taking can be causing shortness of breath.    Expected Outcomes Short: Attend [facility] regularly to improve shortness of breath  with ADL's. Long: maintain independence with ADL's             ITP Comments:  ITP Comments     Row Name 07/17/21 1152 07/24/21 0937 07/30/21 1603 08/08/21 0830 08/20/21 1747   ITP Comments Virtual orientation call completed today. shehas an appointment on Date: 07/24/2021  for EP eval and gym Orientation.  Documentation of diagnosis can be found in [facility] Date: 06/14/2021 . Completed 6MWT and gym orientation. Initial ITP created and sent for review to Dr. Ottie Glazier, Medical Director. First full day of exercise!  Patient was oriented to gym and equipment including functions, settings, policies, and procedures.  Patient's individual exercise prescription and treatment plan were reviewed.  All starting workloads were established based on the results of the 6 minute walk test done at initial orientation visit.  The plan for exercise progression was also introduced and progression will be customized based on patient's performance and goals. 30 day review completed. ITP sent to Dr. Zetta Bills, Medical Director of Pulmonary Rehab. Continue with ITP unless changes are made by physician. Completed initial RD consultation    [place] Name 09/05/21 0656           ITP Comments 30 Day review completed. Medical Director ITP review done, changes made as directed, and signed approval by Medical Director.                Comments:  30 Day review completed. Medical Director ITP review done, changes made as directed, and signed approval by Medical Director.

## 2021-09-07 ENCOUNTER — Ambulatory Visit
Admit: 2021-09-07 | Discharge: 2021-09-08 | Payer: PRIVATE HEALTH INSURANCE | Attending: Adult Health | Primary: Adult Health

## 2021-09-07 MED ORDER — BENZONATATE 100 MG CAPSULE
ORAL_CAPSULE | Freq: Four times a day (QID) | ORAL | 1 refills | 8 days | Status: CP | PRN
Start: 2021-09-07 — End: 2022-09-07

## 2021-09-09 ENCOUNTER — Ambulatory Visit
Admit: 2021-09-09 | Discharge: 2021-09-09 | Disposition: A | Discharge status: Home / Self Care | Payer: PRIVATE HEALTH INSURANCE | Attending: Student in an Organized Health Care Education/Training Program

## 2021-09-09 ENCOUNTER — Emergency Department
Admit: 2021-09-09 | Discharge: 2021-09-09 | Disposition: A | Discharge status: Home / Self Care | Payer: PRIVATE HEALTH INSURANCE | Attending: Student in an Organized Health Care Education/Training Program

## 2021-09-09 DIAGNOSIS — J189 Pneumonia, unspecified organism: Principal | ICD-10-CM

## 2021-09-09 DIAGNOSIS — J441 Chronic obstructive pulmonary disease with (acute) exacerbation: Principal | ICD-10-CM

## 2021-09-09 MED ORDER — CEFUROXIME AXETIL 250 MG TABLET
ORAL_TABLET | Freq: Two times a day (BID) | ORAL | 0 refills | 10 days | Status: CP
Start: 2021-09-09 — End: 2021-09-19

## 2021-09-10 ENCOUNTER — Telehealth: Payer: Self-pay | Admitting: *Deleted

## 2021-09-10 ENCOUNTER — Encounter: Payer: Self-pay | Admitting: *Deleted

## 2021-09-10 DIAGNOSIS — J449 Chronic obstructive pulmonary disease, unspecified: Secondary | ICD-10-CM

## 2021-09-10 MED ORDER — AZITHROMYCIN 500 MG TABLET
ORAL_TABLET | Freq: Every day | ORAL | 0 refills | 5 days | Status: CP
Start: 2021-09-10 — End: 2021-09-15

## 2021-09-10 NOTE — Telephone Encounter (Signed)
Pt called to let us know that she has double pneumonia. She will continue to be out until feeling better.

## 2021-09-18 ENCOUNTER — Ambulatory Visit: Admit: 2021-09-18 | Discharge: 2021-09-18 | Payer: PRIVATE HEALTH INSURANCE

## 2021-09-18 DIAGNOSIS — M818 Other osteoporosis without current pathological fracture: Principal | ICD-10-CM

## 2021-09-19 NOTE — Progress Notes (Unsigned)
Bailey Maldonado has not attended since last review.

## 2021-09-20 ENCOUNTER — Telehealth: Payer: Self-pay

## 2021-09-20 NOTE — Telephone Encounter (Signed)
Left message for patient regarding Pulmonary Rehab- asked for callback.

## 2021-09-21 ENCOUNTER — Ambulatory Visit: Admit: 2021-09-21 | Discharge: 2021-09-22 | Payer: PRIVATE HEALTH INSURANCE

## 2021-09-21 DIAGNOSIS — J449 Chronic obstructive pulmonary disease, unspecified: Principal | ICD-10-CM

## 2021-09-21 DIAGNOSIS — R21 Rash and other nonspecific skin eruption: Principal | ICD-10-CM

## 2021-09-21 DIAGNOSIS — F32A Depression, unspecified depression type: Principal | ICD-10-CM

## 2021-09-21 DIAGNOSIS — F4321 Adjustment disorder with depressed mood: Principal | ICD-10-CM

## 2021-09-21 DIAGNOSIS — G2581 Restless legs syndrome: Principal | ICD-10-CM

## 2021-09-21 DIAGNOSIS — M818 Other osteoporosis without current pathological fracture: Principal | ICD-10-CM

## 2021-09-21 DIAGNOSIS — K743 Primary biliary cirrhosis: Principal | ICD-10-CM

## 2021-09-21 DIAGNOSIS — F325 Major depressive disorder, single episode, in full remission: Principal | ICD-10-CM

## 2021-09-21 DIAGNOSIS — F419 Anxiety disorder, unspecified: Principal | ICD-10-CM

## 2021-09-21 MED ORDER — MUPIROCIN 2 % TOPICAL OINTMENT
1 refills | 0 days | Status: CP
Start: 2021-09-21 — End: ?

## 2021-09-21 MED ORDER — MIRTAZAPINE 15 MG TABLET
ORAL_TABLET | Freq: Every evening | ORAL | 2 refills | 30 days | Status: CP
Start: 2021-09-21 — End: 2022-09-21

## 2021-09-21 MED ORDER — HYDROXYZINE HCL 25 MG TABLET
ORAL_TABLET | Freq: Four times a day (QID) | ORAL | 11 refills | 30.00000 days | Status: CP | PRN
Start: 2021-09-21 — End: 2022-09-21

## 2021-09-21 MED ORDER — PRAMIPEXOLE 0.125 MG TABLET
ORAL_TABLET | Freq: Every evening | ORAL | 11 refills | 30 days | Status: CP
Start: 2021-09-21 — End: 2022-09-21

## 2021-09-26 NOTE — Telephone Encounter (Addendum)
Attempted to call patient again. Left another voicemail. Will send letter.

## 2021-10-02 DIAGNOSIS — J449 Chronic obstructive pulmonary disease, unspecified: Secondary | ICD-10-CM

## 2021-10-02 NOTE — Progress Notes (Signed)
Kem last attended rehab 11/2 which was the first day of the 30-day review cycle - unable to get goals. We have been unable to get in contact with Malachi Bonds and have sent a letter to be discharged 12/9.

## 2021-10-03 ENCOUNTER — Encounter: Payer: Self-pay | Admitting: *Deleted

## 2021-10-03 DIAGNOSIS — J449 Chronic obstructive pulmonary disease, unspecified: Secondary | ICD-10-CM

## 2021-10-03 NOTE — Progress Notes (Signed)
Pulmonary Individual Treatment Plan  Patient Details  Name: Bailey Maldonado MRN: [id number] Date of Birth: 1961/08/01 Referring Provider:   Flowsheet Row Pulmonary Rehab from 07/24/2021 in [facility] Cardiac and Pulmonary Rehab  Referring Provider Willy Eddy MD       Initial Encounter Date:  Flowsheet Row Pulmonary Rehab from 07/24/2021 in [facility] Card[facility] and Pulmonary Rehab  Date 07/24/21       Visit Diagnosis: Chronic obstructive pulmonary disease, unspecified COPD type ([place])  Patient's Home Medications on Admission:  Current Outpatient Medications:    albuterol (VENTOLIN HFA) 108 (90 Base) MCG/ACT inhaler, Inhale into the lungs., Disp: , Rfl:    clobetasol (TEMOVATE) 0.05 % external solution, Apply topically 2 (two) times daily as needed., Disp: , Rfl:    fluticasone (FLONASE) 50 MCG/ACT nasal spray, 2 sprays into each nostril two (2) times a day., Disp: , Rfl:    fluticasone-salmeterol (ADVAIR) 250-50 MCG/ACT AEPB, Inhale into the lungs., Disp: , Rfl:    hydrOXYzine (ATARAX/VISTARIL) 25 MG tablet, Take 25 mg by mouth in the morning and at bedtime., Disp: , Rfl:    Multiple Vitamin (MULTIVITAMIN) tablet, Take 1 tablet by mouth daily., Disp: , Rfl:    pramipexole (MIRAPEX) 0.125 MG tablet, Take by mouth., Disp: , Rfl:    spironolactone (ALDACTONE) 100 MG tablet, Take 100 mg by mouth daily., Disp: , Rfl:    Tiotropium Bromide Monohydrate (SPIRIVA RESPIMAT) 2.5 MCG/ACT AERS, Inhale into the lungs., Disp: , Rfl:    ursodiol (ACTIGALL) 300 MG capsule, Take by mouth., Disp: , Rfl:   Past Medical History: No past medical history on file.  Tobacco Use: Social History   Tobacco Use  Smoking Status Former   Packs/day: 1.50   Years: 35.00   Pack years: 52.50   Types: Cigarettes   Quit date: 11/05/2015   Years since quitting: 5.9  Smokeless Tobacco Never    Labs: Recent Review Flowsheet Data   There is no flowsheet data to display.      Pulmonary Assessment Scores:   Pulmonary Assessment Scores     Row Name 07/24/21 [phone] 07/30/21 1654       ADL UCSD   ADL Phase Entry --    SOB Score total 21 --    Rest 0 --    Walk 1 --    Stairs 5 --    Bath 0 --    Dress 0 --    Shop 1 --      CAT Score   CAT Score 11 --      mMRC Score   mMRC Score -- 1             UCSD: Self-administered rating of dyspnea associated with activities of daily living (ADLs) 6-point scale (0 = "not at all" to 5 = "maximal or unable to do because of breathlessness")  Scoring Scores range from 0 to 120.  Minimally important difference is 5 units  CAT: CAT can identify the health impairment of COPD patients and is better correlated with disease progression.  CAT has a scoring range of zero to 40. The CAT score is classified into four groups of low (less than 10), medium (10 - 20), high (21-30) and very high (31-40) based on the impact level of disease on health status. A CAT score over 10 suggests significant symptoms.  A worsening CAT score could be explained by an exacerbation, poor medication adherence, poor inhaler technique, or progression of COPD or comorbid conditions.  CAT MCID is  2 points  mMRC: mMRC (Modified Medical Research Council) Dyspnea Scale is used to assess the degree of baseline functional disability in patients of respiratory disease due to dyspnea. No minimal important difference is established. A decrease in score of 1 point or greater is considered a positive change.   Pulmonary Function Assessment:   Exercise Target Goals: Exercise Program Goal: Individual exercise prescription set using results from initial 6 min walk test and THRR while considering  patient's activity barriers and safety.   Exercise Prescription Goal: Initial exercise prescription builds to 30-45 minutes a day of aerobic activity, 2-3 days per week.  Home exercise guidelines will be given to patient during program as part of exercise prescription that the participant will  acknowledge.  Education: Aerobic Exercise: - Group verbal and visual presentation on the components of exercise prescription. Introduces F.I.T.T principle from ACSM for exercise prescriptions.  Reviews F.I.T.T. principles of aerobic exercise including progression. Written material given at graduation.   Education: Resistance Exercise: - Group verbal and visual presentation on the components of exercise prescription. Introduces F.I.T.T principle from ACSM for exercise prescriptions  Reviews F.I.T.T. principles of resistance exercise including progression. Written material given at graduation.    Education: Exercise & Equipment Safety: - Individual verbal instruction and demonstration of equipment use and safety with use of the equipment. Flowsheet Row Pulmonary Rehab from 08/22/2021 in [facility] Cardiac and Pulmonary Rehab  Education need identified 07/24/21  Date 07/24/21  Educator [place]  Instruction Review Code 1- Verbalizes Understanding       Education: Exercise Physiology & General Exercise Guidelines: - Group verbal and written instruction with models to review the exercise physiology of the cardiovascular system and associated critical values. Provides general exercise guidelines with specific guidelines to those with heart or lung disease.  Flowsheet Row Pulmonary Rehab from 08/22/2021 in [facility] Cardiac and Pulmonary Rehab  Education need identified 07/24/21  Date 08/22/21  Educator [place]  Instruction Review Code 1- Verbalizes Understanding       Education: Flexibility, Balance, Mind/Body Relaxation: - Group verbal and visual presentation with interactive activity on the components of exercise prescription. Introduces F.I.T.T principle from ACSM for exercise prescriptions. Reviews F.I.T.T. principles of flexibility and balance exercise training including progression. Also discusses the mind body connection.  Reviews various relaxation techniques to help reduce and manage stress (i.e. Deep  breathing, progressive muscle relaxation, and visualization). Balance handout provided to take home. Written material given at graduation.   Activity Barriers & Risk Stratification:  Activity Barriers & Cardiac Risk Stratification - 07/24/21 0955       Activity Barriers & Cardiac Risk Stratification   Activity Barriers Shortness of Breath;Deconditioning             6 Minute Walk:  6 Minute Walk     Row Name 07/24/21 0954         6 Minute Walk   Phase Initial     Distance 1300 feet     Walk Time 6 minutes     # of Rest Breaks 0     MPH 2.46     METS 3.44     RPE 7     Perceived Dyspnea  1     VO2 Peak 12.06     Symptoms Yes (comment)     Comments Slight SOB     Resting HR 80 bpm     Resting BP 102/62     Resting Oxygen Saturation  97 %     Exercise Oxygen Saturation  during 6 min walk 89 %     Max Ex. HR 106 bpm     Max Ex. BP 112/66     2 Minute Post BP 100/62       Interval HR   1 Minute HR 97     2 Minute HR 102     3 Minute HR 103     4 Minute HR 105     5 Minute HR 106     6 Minute HR 106     2 Minute Post HR 86     Interval Heart Rate? Yes       Interval Oxygen   Interval Oxygen? Yes     Baseline Oxygen Saturation % 97 %     1 Minute Oxygen Saturation % 94 %     1 Minute Liters of Oxygen 0 L  RA     2 Minute Oxygen Saturation % 93 %     2 Minute Liters of Oxygen 0 L     3 Minute Oxygen Saturation % 91 %     3 Minute Liters of Oxygen 0 L     4 Minute Oxygen Saturation % 89 %     4 Minute Liters of Oxygen 0 L     5 Minute Oxygen Saturation % 90 %     5 Minute Liters of Oxygen 0 L     6 Minute Oxygen Saturation % 90 %     6 Minute Liters of Oxygen 0 L     2 Minute Post Oxygen Saturation % 93 %     2 Minute Post Liters of Oxygen 0 L             Oxygen Initial Assessment:  Oxygen Initial Assessment - 07/24/21 0938       Home Oxygen   Home Oxygen Device None    Sleep Oxygen Prescription None    Home Exercise Oxygen Prescription None     Home Resting Oxygen Prescription None      Initial 6 min Walk   Oxygen Used None      Program Oxygen Prescription   Program Oxygen Prescription None      Intervention   Short Term Goals To learn and demonstrate proper pursed lip breathing techniques or other breathing techniques. ;To learn and demonstrate proper use of respiratory medications;To learn and understand importance of maintaining oxygen saturations>88%;To learn and understand importance of monitoring SPO2 with pulse oximeter and demonstrate accurate use of the pulse oximeter.;To learn and exhibit compliance with exercise, home and travel O2 prescription    Long  Term Goals Exhibits proper breathing techniques, such as pursed lip breathing or other method taught during program session;Compliance with respiratory medication;Demonstrates proper use of MDI's;Maintenance of O2 saturations>88%;Verbalizes importance of monitoring SPO2 with pulse oximeter and return demonstration;Exhibits compliance with exercise, home  and travel O2 prescription             Oxygen Re-Evaluation:  Oxygen Re-Evaluation     Row Name 08/09/21 1602             Program Oxygen Prescription   Program Oxygen Prescription None         Home Oxygen   Home Oxygen Device None       Sleep Oxygen Prescription None       Home Exercise Oxygen Prescription None       Home Resting Oxygen Prescription None       Compliance with Home Oxygen Use Yes  Goals/Expected Outcomes   Short Term Goals Other       Long  Term Goals Other       Comments Spoke to patient about COPD Action Plan. Reviewed and talked with the patient about the different levels of COPD severity that they should review daily and the steps they can take to manage their COPD. Went over pertinent actions for the patient can take when they feel like they are having a COPD exacerbation and the necessary treatment if needed. If patient has any changes with their breathing or feel like they  are in a different zone of severity to inform staff for re-evaluation. Patient verbalizes understanding. Copy given to patient.       Goals/Expected Outcomes Short: Follow COPD action plan. Long: Report any changes and severity of COPD.                Oxygen Discharge (Final Oxygen Re-Evaluation):  Oxygen Re-Evaluation - 08/09/21 1602       Program Oxygen Prescription   Program Oxygen Prescription None      Home Oxygen   Home Oxygen Device None    Sleep Oxygen Prescription None    Home Exercise Oxygen Prescription None    Home Resting Oxygen Prescription None    Compliance with Home Oxygen Use Yes      Goals/Expected Outcomes   Short Term Goals Other    Long  Term Goals Other    Comments Spoke to patient about COPD Action Plan. Reviewed and talked with the patient about the different levels of COPD severity that they should review daily and the steps they can take to manage their COPD. Went over pertinent actions for the patient can take when they feel like they are having a COPD exacerbation and the necessary treatment if needed. If patient has any changes with their breathing or feel like they are in a different zone of severity to inform staff for re-evaluation. Patient verbalizes understanding. Copy given to patient.    Goals/Expected Outcomes Short: Follow COPD action plan. Long: Report any changes and severity of COPD.             Initial Exercise Prescription:  Initial Exercise Prescription - 07/24/21 1000       Date of Initial Exercise RX and Referring Provider   Date 07/24/21    Referring Provider Willy Eddy MD      Treadmill   MPH 2.6    Grade 0.5    Minutes 15    METs 3.17      Recumbant Bike   Level 3    RPM 60    Watts 30    Minutes 15    METs 3.4      NuStep   Level 3    SPM 80    Minutes 15    METs 3.4      REL-XR   Level 2    Speed 50    Minutes 15    METs 3.4      Prescription Details   Frequency (times per week) 3     Duration Progress to 30 minutes of continuous aerobic without signs/symptoms of physical distress      Intensity   THRR 40-80% of Max Heartrate 113-146    Ratings of Perceived Exertion 11-13    Perceived Dyspnea 0-4      Progression   Progression Continue to progress workloads to maintain intensity without signs/symptoms of physical distress.      Resistance Training  Training Prescription Yes    Weight 3 lb    Reps 10-15             Perform Capillary Blood Glucose checks as needed.  Exercise Prescription Changes:   Exercise Prescription Changes     Row Name 07/24/21 1000 08/06/21 1600 08/22/21 1600 09/03/21 1200       Response to Exercise   Blood Pressure (Admit) 102/62 102/60 132/56 112/60    Blood Pressure (Exercise) 112/66 108/60 128/70 --    Blood Pressure (Exit) 100/62 108/68 104/62 126/64    Heart Rate (Admit) 80 bpm 89 bpm 70 bpm 86 bpm    Heart Rate (Exercise) 106 bpm 109 bpm 111 bpm 109 bpm    Heart Rate (Exit) 94 bpm 103 bpm 80 bpm 90 bpm    Oxygen Saturation (Admit) 97 % 96 % 95 % 96 %    Oxygen Saturation (Exercise) 89 % 92 % 93 % 94 %    Oxygen Saturation (Exit) 96 % 94 % 94 % 96 %    Rating of Perceived Exertion (Exercise) 7 12 13 15     Perceived Dyspnea (Exercise) 1 2 3 2     Symptoms Slight SOB SOB SOB SOB    Comments walk test results -- -- --    Duration -- Progress to 30 minutes of  aerobic without signs/symptoms of physical distress Continue with 30 min of aerobic exercise without signs/symptoms of physical distress. Continue with 30 min of aerobic exercise without signs/symptoms of physical distress.    Intensity -- THRR unchanged THRR unchanged THRR unchanged      Progression   Progression -- Continue to progress workloads to maintain intensity without signs/symptoms of physical distress. Continue to progress workloads to maintain intensity without signs/symptoms of physical distress. Continue to progress workloads to maintain intensity without  signs/symptoms of physical distress.    Average METs -- 3.42 3.4 3.19      Resistance Training   Training Prescription -- Yes Yes Yes    Weight -- 3 lb 5 lb 5 lb    Reps -- 10-15 10-15 10-15      Interval Training   Interval Training -- No No No      Treadmill   MPH -- 2.6 2.6 2.6    Grade -- 0.5 1 1     Minutes -- 15 15 15     METs -- 3.17 3.35 3.35      Recumbant Bike   Level -- -- -- 5    Watts -- -- -- 19    Minutes -- -- -- 15      NuStep   Level -- 3 3 3     Minutes -- 15 15 15     METs -- 3.1 3.5 2.5      REL-XR   Level -- 2 -- 3    Minutes -- 15 -- 15    METs -- 4 -- --      Oxygen   Maintain Oxygen Saturation -- 88% or higher 88% or higher 88% or higher             Exercise Comments:   Exercise Comments     Row Name 07/30/21 1603           Exercise Comments First full day of exercise!  Patient was oriented to gym and equipment including functions, settings, policies, and procedures.  Patient's individual exercise prescription and treatment plan were reviewed.  All starting workloads were established based on the results of  the 6 minute walk test done at initial orientation visit.  The plan for exercise progression was also introduced and progression will be customized based on patient's performance and goals.                Exercise Goals and Review:   Exercise Goals     Row Name 07/24/21 1009             Exercise Goals   Increase Physical Activity Yes       Intervention Provide advice, education, support and counseling about physical activity/exercise needs.;Develop an individualized exercise prescription for aerobic and resistive training based on initial evaluation findings, risk stratification, comorbidities and participant's personal goals.       Expected Outcomes Short Term: Attend rehab on a regular basis to increase amount of physical activity.;Long Term: Add in home exercise to make exercise part of routine and to increase amount of  physical activity.;Long Term: Exercising regularly at least 3-5 days a week.       Increase Strength and Stamina Yes       Intervention Develop an individualized exercise prescription for aerobic and resistive training based on initial evaluation findings, risk stratification, comorbidities and participant's personal goals.;Provide advice, education, support and counseling about physical activity/exercise needs.       Expected Outcomes Short Term: Increase workloads from initial exercise prescription for resistance, speed, and METs.;Short Term: Perform resistance training exercises routinely during rehab and add in resistance training at home;Long Term: Improve cardiorespiratory fitness, muscular endurance and strength as measured by increased METs and functional capacity (6MWT)       Able to understand and use rate of perceived exertion (RPE) scale Yes       Intervention Provide education and explanation on how to use RPE scale       Expected Outcomes Short Term: Able to use RPE daily in rehab to express subjective intensity level;Long Term:  Able to use RPE to guide intensity level when exercising independently       Able to understand and use Dyspnea scale Yes       Intervention Provide education and explanation on how to use Dyspnea scale       Expected Outcomes Short Term: Able to use Dyspnea scale daily in rehab to express subjective sense of shortness of breath during exertion;Long Term: Able to use Dyspnea scale to guide intensity level when exercising independently       Knowledge and understanding of Target Heart Rate Range (THRR) Yes       Intervention Provide education and explanation of THRR including how the numbers were predicted and where they are located for reference       Expected Outcomes Short Term: Able to state/look up THRR;Long Term: Able to use THRR to govern intensity when exercising independently;Short Term: Able to use daily as guideline for intensity in rehab       Able to  check pulse independently Yes       Intervention Provide education and demonstration on how to check pulse in carotid and radial arteries.;Review the importance of being able to check your own pulse for safety during independent exercise       Expected Outcomes Short Term: Able to explain why pulse checking is important during independent exercise;Long Term: Able to check pulse independently and accurately       Understanding of Exercise Prescription Yes       Intervention Provide education, explanation, and written materials on patient's individual exercise prescription  Expected Outcomes Short Term: Able to explain program exercise prescription;Long Term: Able to explain home exercise prescription to exercise independently                Exercise Goals Re-Evaluation :  Exercise Goals Re-Evaluation     Row Name 07/30/21 1603 08/06/21 1609 08/22/21 1622 09/03/21 1239 10/02/21 1410     Exercise Goal Re-Evaluation   Exercise Goals Review Increase Physical Activity;Able to understand and use rate of perceived exertion (RPE) scale;Knowledge and understanding of Target Heart Rate Range (THRR);Understanding of Exercise Prescription;Increase Strength and Stamina;Able to understand and use Dyspnea scale;Able to check pulse independently Increase Physical Activity;Increase Strength and Stamina;Understanding of Exercise Prescription Increase Physical Activity;Increase Strength and Stamina Increase Physical Activity;Increase Strength and Stamina --   Comments Reviewed RPE and dyspnea scales, THR and program prescription with pt today.  Pt voiced understanding and was given a copy of goals to take home. Dawn is off to a good start in rehab.  She is on her fourth full day of exercise today.  We will continue to monitor her progress. Dawn is progressing well and has increased incline on TM.  She has moved up to 5 lb for strength work.  We will continue to monitor progress. Jillyn is continuing to do well.   She has reached up to level 5 on the recumbant bike. Oxygen and RPEs are within appropriate ranges. Will continue to monitor. Out since last review   Expected Outcomes Short: Use RPE daily to regulate intensity. Long: Follow program prescription in THR. Short: Continue to attend rehab regularly Long: Continue to follow program prescription Short: reach THR range Long: build overall stamina Short: Continue to increase loads on the treadmill Long: Continue to increase overall MET level --            Discharge Exercise Prescription (Final Exercise Prescription Changes):  Exercise Prescription Changes - 09/03/21 1200       Response to Exercise   Blood Pressure (Admit) 112/60    Blood Pressure (Exit) 126/64    Heart Rate (Admit) 86 bpm    Heart Rate (Exercise) 109 bpm    Heart Rate (Exit) 90 bpm    Oxygen Saturation (Admit) 96 %    Oxygen Saturation (Exercise) 94 %    Oxygen Saturation (Exit) 96 %    Rating of Perceived Exertion (Exercise) 15    Perceived Dyspnea (Exercise) 2    Symptoms SOB    Duration Continue with 30 min of aerobic exercise without signs/symptoms of physical distress.    Intensity THRR unchanged      Progression   Progression Continue to progress workloads to maintain intensity without signs/symptoms of physical distress.    Average METs 3.19      Resistance Training   Training Prescription Yes    Weight 5 lb    Reps 10-15      Interval Training   Interval Training No      Treadmill   MPH 2.6    Grade 1    Minutes 15    METs 3.35      Recumbant Bike   Level 5    Watts 19    Minutes 15      NuStep   Level 3    Minutes 15    METs 2.5      REL-XR   Level 3    Minutes 15      Oxygen   Maintain Oxygen Saturation 88% or higher  Nutrition:  Target Goals: Understanding of nutrition guidelines, daily intake of sodium <1521m, cholesterol <2067m calories 30% from fat and 7% or less from saturated fats, daily to have 5 or more  servings of fruits and vegetables.  Education: All About Nutrition: -Group instruction provided by verbal, written material, interactive activities, discussions, models, and posters to present general guidelines for heart healthy nutrition including fat, fiber, MyPlate, the role of sodium in heart healthy nutrition, utilization of the nutrition label, and utilization of this knowledge for meal planning. Follow up email sent as well. Written material given at graduation. [facility]  Education need identified 07/24/21       Biometrics:  Pre Biometrics - 07/24/21 0943       Pre Biometrics   Height 5' 6.1" (1.679 m)    Weight 176 lb 9.6 oz (80.1 kg)    BMI (Calculated) 28.42    Single Leg Stand 9.31 seconds              Nutrition Therapy Plan and Nutrition Goals:  Nutrition Therapy & Goals - 08/20/21 1658       Nutrition Therapy   Diet Pulmonary MNT, Heart healthy, low Na    Protein (specify units) 95g    Fiber 25 grams    Whole Grain Foods 3 servings    Saturated Fats 12 max. grams    Fruits and Vegetables 8 servings/day    Sodium 1.5 grams      Personal Nutrition Goals   Nutrition Goal ST: include protein at each meal: examples: egg at breakfast, beans with vegetables when no meat for dinner, add snack likt peanut butter with fruit LT: meet protein needs, avoid feeling hungry throughout the day    Comments She reports having a good apetite. She enjoys fruits and vegetables. B: bowl of grits, or bagel before she gets running for the day L: no lunch or sandwich (meat sandwich) (white bread) D: she feels that she overeats and that she is very hungry. Vegetables (a variety including greens) she doesn't have much meat. She cooks fries in the aiAssumption the man she lives with will complain - he doesn't like the healthy foods she makes. She reports having $900 a month for everything. Discussed general healthy  eating, protein sources, and pulmonary MNT. She also has cirrhosis of the liver in addition to COPD, discussed MNT.      Intervention Plan   Intervention Prescribe, educate and counsel regarding individualized specific dietary modifications aiming towards targeted core components such as weight, hypertension, lipid management, diabetes, heart failure and other comorbidities.    Expected Outcomes Short Term Goal: Understand basic principles of dietary content, such as calories, fat, sodium, cholesterol and nutrients.;Short Term Goal: A plan has been developed with personal nutrition goals set during dietitian appointment.;Long Term Goal: Adherence to prescribed nutrition plan.             Nutrition Assessments:  MEDIFICTS Score Key: ?70 Need to make dietary changes  40-70 Heart Healthy Diet ? 40 Therapeutic Level Cholesterol Diet  [facility]  Picture Your Plate Total Score on Admission 59      Picture Your Plate Scores: <4<68nhealthy dietary pattern with much room for improvement. 41-50 Dietary pattern unlikely to meet recommendations for good health and room for improvement. 51-60 More healthful dietary pattern, with some room for improvement.  >60 Healthy dietary pattern,  although there may be some specific behaviors that could be improved.   Nutrition Goals Re-Evaluation:  Nutrition Goals Re-Evaluation     [place] Name 08/16/21 [place]             Goals   Nutrition Goal Meet with the dietician       Comment Patient was informed on why it is important to maintain a balanced diet when dealing with Respiratory issues. Explained that it takes a lot of energy to breath and when they are short of breath often they will need to have a good diet to help keep up with the calories they are expending for breathing.       Expected Outcome Short: Choose and plan snacks accordingly to patients caloric intake to improve  breathing. Long: Maintain a diet independently that meets their caloric intake to aid in daily shortness of breath.                Nutrition Goals Discharge (Final Nutrition Goals Re-Evaluation):  Nutrition Goals Re-Evaluation - 08/16/21 1550       Goals   Nutrition Goal Meet with the dietician    Comment Patient was informed on why it is important to maintain a balanced diet when dealing with Respiratory issues. Explained that it takes a lot of energy to breath and when they are short of breath often they will need to have a good diet to help keep up with the calories they are expending for breathing.    Expected Outcome Short: Choose and plan snacks accordingly to patients caloric intake to improve breathing. Long: Maintain a diet independently that meets their caloric intake to aid in daily shortness of breath.             Psychosocial: Target Goals: Acknowledge presence or absence of significant depression and/or stress, maximize coping skills, provide positive support system. Participant is able to verbalize types and ability to use techniques and skills needed for reducing stress and depression.   Education: Stress, Anxiety, and Depression - Group verbal and visual presentation to define topics covered.  Reviews how body is impacted by stress, anxiety, and depression.  Also discusses healthy ways to reduce stress and to treat/manage anxiety and depression.  Written material given at graduation. Flowsheet Row Pulmonary Rehab from 08/22/2021 in [facility] Cardiac and Pulmonary Rehab  Date 08/15/21  Educator [place]  Instruction Review Code 1- Verbalizes Understanding       Education: Sleep Hygiene -Provides group verbal and written instruction about how sleep can affect your health.  Define sleep hygiene, discuss sleep cycles and impact of sleep habits. Review good sleep hygiene tips.    Initial Review & Psychosocial Screening:  Initial Psych Review & Screening - 07/17/21 1139        Initial Review   Current issues with Current Stress Concerns    Source of Stress Concerns Family    Comments Husband  demntia and in facility      Helper? Yes   daughter   close friend     Barriers   Psychosocial barriers to participate in program There are no identifiable barriers or psychosocial needs.;The patient should benefit from training in stress management and relaxation.      Screening Interventions   Interventions Encouraged to exercise;To provide support and resources with identified psychosocial needs;Provide feedback about the scores to participant    Expected Outcomes Short Term goal: Utilizing psychosocial counselor, staff and physician to assist with identification  of specific Stressors or current issues interfering with healing process. Setting desired goal for each stressor or current issue identified.;Long Term Goal: Stressors or current issues are controlled or eliminated.;Short Term goal: Identification and review with participant of any Quality of Life or Depression concerns found by scoring the questionnaire.;Long Term goal: The participant improves quality of Life and PHQ9 Scores as seen by post scores and/or verbalization of changes             Quality of Life Scores:  Scores of 19 and below usually indicate a poorer quality of life in these areas.  A difference of  2-3 points is a clinically meaningful difference.  A difference of 2-3 points in the total score of the Quality of Life Index has been associated with significant improvement in overall quality of life, self-image, physical symptoms, and general health in studies assessing change in quality of life.  PHQ-9: Recent Review Flowsheet Data     Depression screen [facility] 2/9 07/24/2021   Decreased Interest 0   Down, Depressed, Hopeless 0   PHQ - 2 Score 0   Altered sleeping 0   Tired, decreased energy 1   Change in appetite 2   Feeling bad or failure about yourself  0    Trouble concentrating 0   Moving slowly or fidgety/restless 0   Suicidal thoughts 0   PHQ-9 Score 3   Difficult doing work/chores Not difficult at all      Interpretation of Total Score  Total Score Depression Severity:  1-4 = Minimal depression, 5-9 = Mild depression, 10-14 = Moderate depression, 15-19 = Moderately severe depression, 20-27 = Severe depression   Psychosocial Evaluation and Intervention:  Psychosocial Evaluation - 07/17/21 1145       Psychosocial Evaluation & Interventions   Interventions Encouraged to exercise with the program and follow exercise prescription;Relaxation education;Stress management education    Comments Braelynne has no barriers to attending the program. She has support system of her daughter and a close friend. She does have significant stress as her husband has dementia and lives in a facility in [place]. She goes to see him daily. She has gained weight since quitting cigarettes in2017. She quit because she wanted to be here for her grandchild that was born on her birthday.  She wants to get back to her weight before she quit smoking.  She is ready to get started.    Expected Outcomes STG: Amabel will be able to attend all scheduled sessions, she will gain knowledge to help her reduce her stress. LTG: Kiauna will continue to use what she learned in the program to help with her stress.    Continue Psychosocial Services  Follow up required by staff             Psychosocial Re-Evaluation:  Psychosocial Re-Evaluation     [place] Name 08/16/21 1555             Psychosocial Re-Evaluation   Current issues with None Identified       Comments Patient reports no issues with their current mental states, sleep, stress, depression or anxiety. Will follow up with patient in a few weeks for any changes.       Expected Outcomes Short: Continue to exercise regularly to support mental health and notify staff of any changes. Long: maintain mental health and well  being through teaching of rehab or prescribed medications independently.       Interventions Encouraged to attend Pulmonary Rehabilitation for the exercise  Continue Psychosocial Services  Follow up required by staff                Psychosocial Discharge (Final Psychosocial Re-Evaluation):  Psychosocial Re-Evaluation - 08/16/21 1555       Psychosocial Re-Evaluation   Current issues with None Identified    Comments Patient reports no issues with their current mental states, sleep, stress, depression or anxiety. Will follow up with patient in a few weeks for any changes.    Expected Outcomes Short: Continue to exercise regularly to support mental health and notify staff of any changes. Long: maintain mental health and well being through teaching of rehab or prescribed medications independently.    Interventions Encouraged to attend Pulmonary Rehabilitation for the exercise    Continue Psychosocial Services  Follow up required by staff             Education: Education Goals: Education classes will be provided on a weekly basis, covering required topics. Participant will state understanding/return demonstration of topics presented.  Learning Barriers/Preferences:   General Pulmonary Education Topics:  Infection Prevention: - Provides verbal and written material to individual with discussion of infection control including proper hand washing and proper equipment cleaning during exercise session. Flowsheet Row Pulmonary Rehab from 08/22/2021 in [facility] Cardiac and Pulmonary Rehab  Education need identified 07/24/21  Date 07/24/21  Educator [place]  Instruction Review Code 1- Verbalizes Understanding       Falls Prevention: - Provides verbal and written material to individual with discussion of falls prevention and safety. Flowsheet Row Pulmonary Rehab from 08/22/2021 in [facility] Cardiac and Pulmonary Rehab  Education need identified 07/24/21  Date 07/24/21  Educator [place]   Instruction Review Code 1- Verbalizes Understanding       Chronic Lung Disease Review: - Group verbal instruction with posters, models, PowerPoint presentations and videos,  to review new updates, new respiratory medications, new advancements in procedures and treatments. Providing information on websites and "800" numbers for continued self-education. Includes information about supplement oxygen, available portable oxygen systems, continuous and intermittent flow rates, oxygen safety, concentrators, and Medicare reimbursement for oxygen. Explanation of Pulmonary Drugs, including class, frequency, complications, importance of spacers, rinsing mouth after steroid MDI's, and proper cleaning methods for nebulizers. Review of basic lung anatomy and physiology related to function, structure, and complications of lung disease. Review of risk factors. Discussion about methods for diagnosing sleep apnea and types of masks and machines for OSA. Includes a review of the use of types of environmental controls: home humidity, furnaces, filters, dust mite/pet prevention, HEPA vacuums. Discussion about weather changes, air quality and the benefits of nasal washing. Instruction on Warning signs, infection symptoms, calling MD promptly, preventive modes, and value of vaccinations. Review of effective airway clearance, coughing and/or vibration techniques. Emphasizing that all should Create an Action Plan. Written material given at graduation. Flowsheet Row Pulmonary Rehab from 08/22/2021 in [facility] Cardiac and Pulmonary Rehab  Education need identified 07/24/21  Date 08/08/21  Educator [facility]  Instruction Review Code 1- Verbalizes Understanding       AED/CPR: - Group verbal and written instruction with the use of models to demonstrate the basic use of the AED with the basic ABC's of resuscitation.    Anatomy and Cardiac Procedures: - Group verbal and visual presentation and models provide information about basic  cardiac anatomy and function. Reviews the testing methods done to diagnose heart disease and the outcomes of the test results. Describes the treatment choices: Medical Management, Angioplasty, or Coronary Bypass Surgery  for treating various heart conditions including Myocardial Infarction, Angina, Valve Disease, and Cardiac Arrhythmias.  Written material given at graduation.   Medication Safety: - Group verbal and visual instruction to review commonly prescribed medications for heart and lung disease. Reviews the medication, class of the drug, and side effects. Includes the steps to properly store meds and maintain the prescription regimen.  Written material given at graduation.   Other: -Provides group and verbal instruction on various topics (see comments)   Knowledge Questionnaire Score:  Knowledge Questionnaire Score - 07/24/21 0942       Knowledge Questionnaire Score   Pre Score 13/18: HR, ADL, Nutrition, O2              Core Components/Risk Factors/Patient Goals at Admission:  Personal Goals and Risk Factors at Admission - 07/24/21 1009       Core Components/Risk Factors/Patient Goals on Admission    Weight Management Yes;Weight Loss    Intervention Weight Management: Develop a combined nutrition and exercise program designed to reach desired caloric intake, while maintaining appropriate intake of nutrient and fiber, sodium and fats, and appropriate energy expenditure required for the weight goal.;Weight Management: Provide education and appropriate resources to help participant work on and attain dietary goals.;Weight Management/Obesity: Establish reasonable short term and long term weight goals.    Admit Weight 176 lb (79.8 kg)    Goal Weight: Short Term 170 lb (77.1 kg)    Goal Weight: Long Term 160 lb (72.6 kg)    Expected Outcomes Short Term: Continue to assess and modify interventions until short term weight is achieved;Long Term: Adherence to nutrition and physical  activity/exercise program aimed toward attainment of established weight goal;Weight Loss: Understanding of general recommendations for a balanced deficit meal plan, which promotes 1-2 lb weight loss per week and includes a negative energy balance of [phone] kcal/d;Understanding recommendations for meals to include 15-35% energy as protein, 25-35% energy from fat, 35-60% energy from carbohydrates, less than 266m of dietary cholesterol, 20-35 gm of total fiber daily;Understanding of distribution of calorie intake throughout the day with the consumption of 4-5 meals/snacks    Improve shortness of breath with ADL's Yes    Intervention Provide education, individualized exercise plan and daily activity instruction to help decrease symptoms of SOB with activities of daily living.    Expected Outcomes Short Term: Improve cardiorespiratory fitness to achieve a reduction of symptoms when performing ADLs;Long Term: Be able to perform more ADLs without symptoms or delay the onset of symptoms    Increase knowledge of respiratory medications and ability to use respiratory devices properly  Yes    Intervention Provide education and demonstration as needed of appropriate use of medications, inhalers, and oxygen therapy.    Expected Outcomes Short Term: Achieves understanding of medications use. Understands that oxygen is a medication prescribed by physician. Demonstrates appropriate use of inhaler and oxygen therapy.;Long Term: Maintain appropriate use of medications, inhalers, and oxygen therapy.             Education:Diabetes - Individual verbal and written instruction to review signs/symptoms of diabetes, desired ranges of glucose level fasting, after meals and with exercise. Acknowledge that pre and post exercise glucose checks will be done for 3 sessions at entry of program.   Know Your Numbers and Heart Failure: - Group verbal and visual instruction to discuss disease risk factors for cardiac and  pulmonary disease and treatment options.  Reviews associated critical values for Overweight/Obesity, Hypertension, Cholesterol, and Diabetes.  Discusses basics of  heart failure: signs/symptoms and treatments.  Introduces Heart Failure Zone chart for action plan for heart failure.  Written material given at graduation. Flowsheet Row Pulmonary Rehab from 08/22/2021 in [facility] Cardiac and Pulmonary Rehab  Date 08/01/21  Educator [facility]  Instruction Review Code 1- Verbalizes Understanding       Core Components/Risk Factors/Patient Goals Review:   Goals and Risk Factor Review     [place] Name 08/16/21 1549             Core Components/Risk Factors/Patient Goals Review   Personal Goals Review Improve shortness of breath with ADL's       Review Spoke to patient about their shortness of breath and what they can do to improve. Patient has been informed of breathing techniques when starting the program. Patient is informed to tell staff if they have had any med changes and that certain meds they are taking or not taking can be causing shortness of breath.       Expected Outcomes Short: Attend [facility] regularly to improve shortness of breath with ADL's. Long: maintain independence with ADL's                Core Components/Risk Factors/Patient Goals at Discharge (Final Review):   Goals and Risk Factor Review - 08/16/21 1549       Core Components/Risk Factors/Patient Goals Review   Personal Goals Review Improve shortness of breath with ADL's    Review Spoke to patient about their shortness of breath and what they can do to improve. Patient has been informed of breathing techniques when starting the program. Patient is informed to tell staff if they have had any med changes and that certain meds they are taking or not taking can be causing shortness of breath.    Expected Outcomes Short: Attend [facility] regularly to improve shortness of breath with ADL's. Long: maintain independence with ADL's              ITP Comments:  ITP Comments     Row Name 07/17/21 1152 07/24/21 0937 07/30/21 1603 08/08/21 0830 08/20/21 1747   ITP Comments Virtual orientation call completed today. shehas an appointment on Date: 07/24/2021  for EP eval and gym Orientation.  Documentation of diagnosis [facility] be found in [facility] Date: 06/14/2021 . Completed 6MWT and gym orientation. Initial ITP created and sent for review to Dr. Ottie Glazier, Medical Director. First full day of exercise!  Patient was oriented to gym and equipment including functions, settings, policies, and procedures.  Patient's individual exercise prescription and treatment plan were reviewed.  All starting workloads were established based on the results of the 6 minute walk test done at initial orientation visit.  The plan for exercise progression was also introduced and progression will be customized based on patient's performance and goals. 30 day review completed. ITP sent to Dr. Zetta Bills, Medical Director of Pulmonary Rehab. Continue with ITP unless changes are made by physician. Completed initial RD consultation    Row Name 09/05/21 0656 09/10/21 1208 09/19/21 1647 10/02/21 0731 10/03/21 0642   ITP Comments 30 Day review completed. Medical Director ITP review done, changes made as directed, and signed approval by Medical Director. Pt called to let us know that she has double pneumonia. She will continue to be out until feeling better. Dessie has not attended since last review. Jazelyn last attended rehab 11/2 which was the first day of the 30-day review cycle - unable to get goals. We have been unable to get in contact  with [facility] and have sent a letter to be discharged 12/9. 30 Day review completed. Medical Director ITP review done, changes made as directed, and signed approval by Medical Director.            Comments:

## 2021-10-04 ENCOUNTER — Encounter: Payer: Medicaid Other | Attending: Pulmonary Disease

## 2021-10-04 DIAGNOSIS — J449 Chronic obstructive pulmonary disease, unspecified: Secondary | ICD-10-CM | POA: Insufficient documentation

## 2021-10-11 NOTE — Progress Notes (Signed)
Pulmonary Individual Treatment Plan  Patient Details  Name: Bailey Maldonado MRN: [id number] Date of Birth: 1960/11/20 Referring Provider:   Flowsheet Row Pulmonary Rehab from 07/24/2021 in [facility] Cardiac and Pulmonary Rehab  Referring Provider Willy Eddy MD       Initial Encounter Date:  Flowsheet Row Pulmonary Rehab from 07/24/2021 in [facility] Cardiac and Pulmonary Rehab  Date 07/24/21       Visit Diagnosis: No diagnosis found.  Patient's Home Medications on Admission:  Current Outpatient Medications:    albuterol (VENTOLIN HFA) 108 (90 Base) MCG/ACT inhaler, Inhale into the lungs., Disp: , Rfl:    clobetasol (TEMOVATE) 0.05 % external solution, Apply topically 2 (two) times daily as needed., Disp: , Rfl:    fluticasone (FLONASE) 50 MCG/ACT nasal spray, 2 sprays into each nostril two (2) times a day., Disp: , Rfl:    fluticasone-salmeterol (ADVAIR) 250-50 MCG/ACT AEPB, Inhale into the lungs., Disp: , Rfl:    hydrOXYzine (ATARAX/VISTARIL) 25 MG tablet, Take 25 mg by mouth in the morning and at bedtime., Disp: , Rfl:    Multiple Vitamin (MULTIVITAMIN) tablet, Take 1 tablet by mouth daily., Disp: , Rfl:    pramipexole (MIRAPEX) 0.125 MG tablet, Take by mouth., Disp: , Rfl:    spironolactone (ALDACTONE) 100 MG tablet, Take 100 mg by mouth daily., Disp: , Rfl:    Tiotropium Bromide Monohydrate (SPIRIVA RESPIMAT) 2.5 MCG/ACT AERS, Inhale into the lungs., Disp: , Rfl:    ursodiol (ACTIGALL) 300 MG capsule, Take by mouth., Disp: , Rfl:   Past Medical History: No past medical history on file.  Tobacco Use: Social History   Tobacco Use  Smoking Status Former   Packs/day: 1.50   Years: 35.00   Pack years: 52.50   Types: Cigarettes   Quit date: 11/05/2015   Years since quitting: 5.9  Smokeless Tobacco Never    Labs: Recent Review Flowsheet Data   There is no flowsheet data to display.      Pulmonary Assessment Scores:  Pulmonary Assessment Scores     Row Name 07/24/21  [phone] 07/30/21 1654       ADL UCSD   ADL Phase Entry --    SOB Score total 21 --    Rest 0 --    Walk 1 --    Stairs 5 --    Bath 0 --    Dress 0 --    Shop 1 --      CAT Score   CAT Score 11 --      mMRC Score   mMRC Score -- 1             UCSD: Self-administered rating of dyspnea associated with activities of daily living (ADLs) 6-point scale (0 = "not at all" to 5 = "maximal or unable to do because of breathlessness")  Scoring Scores range from 0 to 120.  Minimally important difference is 5 units  CAT: CAT can identify the health impairment of COPD patients and is better correlated with disease progression.  CAT has a scoring range of zero to 40. The CAT score is classified into four groups of low (less than 10), medium (10 - 20), high (21-30) and very high (31-40) based on the impact level of disease on health status. A CAT score over 10 suggests significant symptoms.  A worsening CAT score could be explained by an exacerbation, poor medication adherence, poor inhaler technique, or progression of COPD or comorbid conditions.  CAT MCID is 2 points  mMRC: mMRC (  [place]) Dyspnea Scale is used to assess the degree of baseline functional disability in patients of respiratory disease due to dyspnea. No minimal important difference is established. A decrease in score of 1 point or greater is considered a positive change.   Pulmonary Function Assessment:   Exercise Target Goals: Exercise Program Goal: Individual exercise prescription set using results from initial 6 min walk test and THRR while considering  patient's activity barriers and safety.   Exercise Prescription Goal: Initial exercise prescription builds to 30-45 minutes a day of aerobic activity, 2-3 days per week.  Home exercise guidelines will be given to patient during program as part of exercise prescription that the participant will acknowledge.  Education: Aerobic Exercise: - Group  verbal and visual presentation on the components of exercise prescription. Introduces F.I.T.T principle from ACSM for exercise prescriptions.  Reviews F.I.T.T. principles of aerobic exercise including progression. Written material given at graduation.   Education: Resistance Exercise: - Group verbal and visual presentation on the components of exercise prescription. Introduces F.I.T.T principle from ACSM for exercise prescriptions  Reviews F.I.T.T. principles of resistance exercise including progression. Written material given at graduation.    Education: Exercise & Equipment Safety: - Individual verbal instruction and demonstration of equipment use and safety with use of the equipment. Flowsheet Row Pulmonary Rehab from 08/22/2021 in [facility] Cardiac and Pulmonary Rehab  Education need identified 07/24/21  Date 07/24/21  Educator [place]  Instruction Review Code 1- Verbalizes Understanding       Education: Exercise Physiology & General Exercise Guidelines: - Group verbal and written instruction with models to review the exercise physiology of the cardiovascular system and associated critical values. Provides general exercise guidelines with specific guidelines to those with heart or lung disease.  Flowsheet Row Pulmonary Rehab from 08/22/2021 in [facility] Cardiac and Pulmonary Rehab  Education need identified 07/24/21  Date 08/22/21  Educator [place]  Instruction Review Code 1- Verbalizes Understanding       Education: Flexibility, Balance, Mind/Body Relaxation: - Group verbal and visual presentation with interactive activity on the components of exercise prescription. Introduces F.I.T.T principle from ACSM for exercise prescriptions. Reviews F.I.T.T. principles of flexibility and balance exercise training including progression. Also discusses the mind body connection.  Reviews various relaxation techniques to help reduce and manage stress (i.e. Deep breathing, progressive muscle relaxation, and  visualization). Balance handout provided to take home. Written material given at graduation.   Activity Barriers & Risk Stratification:  Activity Barriers & Cardiac Risk Stratification - 07/24/21 0955       Activity Barriers & Cardiac Risk Stratification   Activity Barriers Shortness of Breath;Deconditioning             6 Minute Walk:  6 Minute Walk     Row Name 07/24/21 0954         6 Minute Walk   Phase Initial     Distance 1300 feet     Walk Time 6 minutes     # of Rest Breaks 0     MPH 2.46     METS 3.44     RPE 7     Perceived Dyspnea  1     VO2 Peak 12.06     Symptoms Yes (comment)     Comments Slight SOB     Resting HR 80 bpm     Resting BP 102/62     Resting Oxygen Saturation  97 %     Exercise Oxygen Saturation  during 6 min walk  89 %     Max Ex. HR 106 bpm     Max Ex. BP 112/66     2 Minute Post BP 100/62       Interval HR   1 Minute HR 97     2 Minute HR 102     3 Minute HR 103     4 Minute HR 105     5 Minute HR 106     6 Minute HR 106     2 Minute Post HR 86     Interval Heart Rate? Yes       Interval Oxygen   Interval Oxygen? Yes     Baseline Oxygen Saturation % 97 %     1 Minute Oxygen Saturation % 94 %     1 Minute Liters of Oxygen 0 L  RA     2 Minute Oxygen Saturation % 93 %     2 Minute Liters of Oxygen 0 L     3 Minute Oxygen Saturation % 91 %     3 Minute Liters of Oxygen 0 L     4 Minute Oxygen Saturation % 89 %     4 Minute Liters of Oxygen 0 L     5 Minute Oxygen Saturation % 90 %     5 Minute Liters of Oxygen 0 L     6 Minute Oxygen Saturation % 90 %     6 Minute Liters of Oxygen 0 L     2 Minute Post Oxygen Saturation % 93 %     2 Minute Post Liters of Oxygen 0 L             Oxygen Initial Assessment:  Oxygen Initial Assessment - 07/24/21 0938       Home Oxygen   Home Oxygen Device None    Sleep Oxygen Prescription None    Home Exercise Oxygen Prescription None    Home Resting Oxygen Prescription None       Initial 6 min Walk   Oxygen Used None      Program Oxygen Prescription   Program Oxygen Prescription None      Intervention   Short Term Goals To learn and demonstrate proper pursed lip breathing techniques or other breathing techniques. ;To learn and demonstrate proper use of respiratory medications;To learn and understand importance of maintaining oxygen saturations>88%;To learn and understand importance of monitoring SPO2 with pulse oximeter and demonstrate accurate use of the pulse oximeter.;To learn and exhibit compliance with exercise, home and travel O2 prescription    Long  Term Goals Exhibits proper breathing techniques, such as pursed lip breathing or other method taught during program session;Compliance with respiratory medication;Demonstrates proper use of MDI's;Maintenance of O2 saturations>88%;Verbalizes importance of monitoring SPO2 with pulse oximeter and return demonstration;Exhibits compliance with exercise, home  and travel O2 prescription             Oxygen Re-Evaluation:  Oxygen Re-Evaluation     Row Name 08/09/21 1602             Program Oxygen Prescription   Program Oxygen Prescription None         Home Oxygen   Home Oxygen Device None       Sleep Oxygen Prescription None       Home Exercise Oxygen Prescription None       Home Resting Oxygen Prescription None       Compliance with Home Oxygen Use Yes  Goals/Expected Outcomes   Short Term Goals Other       Long  Term Goals Other       Comments Spoke to patient about COPD Action Plan. Reviewed and talked with the patient about the different levels of COPD severity that they should review daily and the steps they can take to manage their COPD. Went over pertinent actions for the patient can take when they feel like they are having a COPD exacerbation and the necessary treatment if needed. If patient has any changes with their breathing or feel like they are in a different zone of severity to inform  staff for re-evaluation. Patient verbalizes understanding. Copy given to patient.       Goals/Expected Outcomes Short: Follow COPD action plan. Long: Report any changes and severity of COPD.                Oxygen Discharge (Final Oxygen Re-Evaluation):  Oxygen Re-Evaluation - 08/09/21 1602       Program Oxygen Prescription   Program Oxygen Prescription None      Home Oxygen   Home Oxygen Device None    Sleep Oxygen Prescription None    Home Exercise Oxygen Prescription None    Home Resting Oxygen Prescription None    Compliance with Home Oxygen Use Yes      Goals/Expected Outcomes   Short Term Goals Other    Long  Term Goals Other    Comments Spoke to patient about COPD Action Plan. Reviewed and talked with the patient about the different levels of COPD severity that they should review daily and the steps they can take to manage their COPD. Went over pertinent actions for the patient can take when they feel like they are having a COPD exacerbation and the necessary treatment if needed. If patient has any changes with their breathing or feel like they are in a different zone of severity to inform staff for re-evaluation. Patient verbalizes understanding. Copy given to patient.    Goals/Expected Outcomes Short: Follow COPD action plan. Long: Report any changes and severity of COPD.             Initial Exercise Prescription:  Initial Exercise Prescription - 07/24/21 1000       Date of Initial Exercise RX and Referring Provider   Date 07/24/21    Referring Provider Willy Eddy MD      Treadmill   MPH 2.6    Grade 0.5    Minutes 15    METs 3.17      Recumbant Bike   Level 3    RPM 60    Watts 30    Minutes 15    METs 3.4      NuStep   Level 3    SPM 80    Minutes 15    METs 3.4      REL-XR   Level 2    Speed 50    Minutes 15    METs 3.4      Prescription Details   Frequency (times per week) 3    Duration Progress to 30 minutes of continuous  aerobic without signs/symptoms of physical distress      Intensity   THRR 40-80% of Max Heartrate 113-146    Ratings of Perceived Exertion 11-13    Perceived Dyspnea 0-4      Progression   Progression Continue to progress workloads to maintain intensity without signs/symptoms of physical distress.      Resistance Training  Training Prescription Yes    Weight 3 lb    Reps 10-15             Perform Capillary Blood Glucose checks as needed.  Exercise Prescription Changes:   Exercise Prescription Changes     Row Name 07/24/21 1000 08/06/21 1600 08/22/21 1600 09/03/21 1200       Response to Exercise   Blood Pressure (Admit) 102/62 102/60 132/56 112/60    Blood Pressure (Exercise) 112/66 108/60 128/70 --    Blood Pressure (Exit) 100/62 108/68 104/62 126/64    Heart Rate (Admit) 80 bpm 89 bpm 70 bpm 86 bpm    Heart Rate (Exercise) 106 bpm 109 bpm 111 bpm 109 bpm    Heart Rate (Exit) 94 bpm 103 bpm 80 bpm 90 bpm    Oxygen Saturation (Admit) 97 % 96 % 95 % 96 %    Oxygen Saturation (Exercise) 89 % 92 % 93 % 94 %    Oxygen Saturation (Exit) 96 % 94 % 94 % 96 %    Rating of Perceived Exertion (Exercise) _0 Perceived Dyspnea (Exercise) _1 Symptoms Slight SOB SOB SOB SOB    Comments walk test results -- -- --    Duration -- Progress to 30 minutes of  aerobic without signs/symptoms of physical distress Continue with 30 min of aerobic exercise without signs/symptoms of physical distress. Continue with 30 min of aerobic exercise without signs/symptoms of physical distress.    Intensity -- THRR unchanged THRR unchanged THRR unchanged      Progression   Progression -- Continue to progress workloads to maintain intensity without signs/symptoms of physical distress. Continue to progress workloads to maintain intensity without signs/symptoms of physical distress. Continue to progress workloads to maintain intensity without signs/symptoms of physical distress.     Average METs -- 3.42 3.4 3.19      Resistance Training   Training Prescription -- Yes Yes Yes    Weight -- 3 lb 5 lb 5 lb    Reps -- 10-15 10-15 10-15      Interval Training   Interval Training -- No No No      Treadmill   MPH -- 2.6 2.6 2.6    Grade -- 0._2 Minutes -- _3 METs -- 3.17 3.35 3.35      Recumbant Bike   Level -- -- -- 5    Watts -- -- -- 19    Minutes -- -- -- 15      NuStep   Level -- _4 Minutes -- _5 METs -- 3.1 3.5 2.5      REL-XR   Level -- 2 -- 3    Minutes -- 15 -- 15    METs -- 4 -- --      Oxygen   Maintain Oxygen Saturation -- 88% or higher 88% or higher 88% or higher             Exercise Comments:   Exercise Comments     Row Name 07/30/21 1603           Exercise Comments First full day of exercise!  Patient was oriented to gym and equipment including functions, settings, policies, and procedures.  Patient's individual exercise prescription and treatment plan were reviewed.  All starting workloads were established based on the results of  the 6 minute walk test done at initial orientation visit.  The plan for exercise progression was also introduced and progression will be customized based on patient's performance and goals.                Exercise Goals and Review:   Exercise Goals     Row Name 07/24/21 1009             Exercise Goals   Increase Physical Activity Yes       Intervention Provide advice, education, support and counseling about physical activity/exercise needs.;Develop an individualized exercise prescription for aerobic and resistive training based on initial evaluation findings, risk stratification, comorbidities and participant's personal goals.       Expected Outcomes Short Term: Attend rehab on a regular basis to increase amount of physical activity.;Long Term: Add in home exercise to make exercise part of routine and to increase amount of physical activity.;Long Term: Exercising  regularly at least 3-5 days a week.       Increase Strength and Stamina Yes       Intervention Develop an individualized exercise prescription for aerobic and resistive training based on initial evaluation findings, risk stratification, comorbidities and participant's personal goals.;Provide advice, education, support and counseling about physical activity/exercise needs.       Expected Outcomes Short Term: Increase workloads from initial exercise prescription for resistance, speed, and METs.;Short Term: Perform resistance training exercises routinely during rehab and add in resistance training at home;Long Term: Improve cardiorespiratory fitness, muscular endurance and strength as measured by increased METs and functional capacity (6MWT)       Able to understand and use rate of perceived exertion (RPE) scale Yes       Intervention Provide education and explanation on how to use RPE scale       Expected Outcomes Short Term: Able to use RPE daily in rehab to express subjective intensity level;Long Term:  Able to use RPE to guide intensity level when exercising independently       Able to understand and use Dyspnea scale Yes       Intervention Provide education and explanation on how to use Dyspnea scale       Expected Outcomes Short Term: Able to use Dyspnea scale daily in rehab to express subjective sense of shortness of breath during exertion;Long Term: Able to use Dyspnea scale to guide intensity level when exercising independently       Knowledge and understanding of Target Heart Rate Range (THRR) Yes       Intervention Provide education and explanation of THRR including how the numbers were predicted and where they are located for reference       Expected Outcomes Short Term: Able to state/look up THRR;Long Term: Able to use THRR to govern intensity when exercising independently;Short Term: Able to use daily as guideline for intensity in rehab       Able to check pulse independently Yes        Intervention Provide education and demonstration on how to check pulse in carotid and radial arteries.;Review the importance of being able to check your own pulse for safety during independent exercise       Expected Outcomes Short Term: Able to explain why pulse checking is important during independent exercise;Long Term: Able to check pulse independently and accurately       Understanding of Exercise Prescription Yes       Intervention Provide education, explanation, and written materials on patient's individual exercise prescription  Expected Outcomes Short Term: Able to explain program exercise prescription;Long Term: Able to explain home exercise prescription to exercise independently                Exercise Goals Re-Evaluation :  Exercise Goals Re-Evaluation     Row Name 07/30/21 1603 08/06/21 1609 08/22/21 1622 09/03/21 1239 10/02/21 1410     Exercise Goal Re-Evaluation   Exercise Goals Review Increase Physical Activity;Able to understand and use rate of perceived exertion (RPE) scale;Knowledge and understanding of Target Heart Rate Range (THRR);Understanding of Exercise Prescription;Increase Strength and Stamina;Able to understand and use Dyspnea scale;Able to check pulse independently Increase Physical Activity;Increase Strength and Stamina;Understanding of Exercise Prescription Increase Physical Activity;Increase Strength and Stamina Increase Physical Activity;Increase Strength and Stamina --   Comments Reviewed RPE and dyspnea scales, THR and program prescription with pt today.  Pt voiced understanding and was given a copy of goals to take home. Dawn is off to a good start in rehab.  She is on her fourth full day of exercise today.  We will continue to monitor her progress. Dawn is progressing well and has increased incline on TM.  She has moved up to 5 lb for strength work.  We will continue to monitor progress. Darius is continuing to do well.  She has reached up to level 5 on  the recumbant bike. Oxygen and RPEs are within appropriate ranges. Will continue to monitor. Out since last review   Expected Outcomes Short: Use RPE daily to regulate intensity. Long: Follow program prescription in THR. Short: Continue to attend rehab regularly Long: Continue to follow program prescription Short: reach THR range Long: build overall stamina Short: Continue to increase loads on the treadmill Long: Continue to increase overall MET level --            Discharge Exercise Prescription (Final Exercise Prescription Changes):  Exercise Prescription Changes - 09/03/21 1200       Response to Exercise   Blood Pressure (Admit) 112/60    Blood Pressure (Exit) 126/64    Heart Rate (Admit) 86 bpm    Heart Rate (Exercise) 109 bpm    Heart Rate (Exit) 90 bpm    Oxygen Saturation (Admit) 96 %    Oxygen Saturation (Exercise) 94 %    Oxygen Saturation (Exit) 96 %    Rating of Perceived Exertion (Exercise) 15    Perceived Dyspnea (Exercise) 2    Symptoms SOB    Duration Continue with 30 min of aerobic exercise without signs/symptoms of physical distress.    Intensity THRR unchanged      Progression   Progression Continue to progress workloads to maintain intensity without signs/symptoms of physical distress.    Average METs 3.19      Resistance Training   Training Prescription Yes    Weight 5 lb    Reps 10-15      Interval Training   Interval Training No      Treadmill   MPH 2.6    Grade 1    Minutes 15    METs 3.35      Recumbant Bike   Level 5    Watts 19    Minutes 15      NuStep   Level 3    Minutes 15    METs 2.5      REL-XR   Level 3    Minutes 15      Oxygen   Maintain Oxygen Saturation 88% or higher  Nutrition:  Target Goals: Understanding of nutrition guidelines, daily intake of sodium <1533m, cholesterol <2072m calories 30% from fat and 7% or less from saturated fats, daily to have 5 or more servings of fruits and  vegetables.  Education: All About Nutrition: -Group instruction provided by verbal, written material, interactive activities, discussions, models, and posters to present general guidelines for heart healthy nutrition including fat, fiber, MyPlate, the role of sodium in heart healthy nutrition, utilization of the nutrition label, and utilization of this knowledge for meal planning. Follow up email sent as well. Written material given at graduation. [facility]  Education need identified 07/24/21       Biometrics:  Pre Biometrics - 07/24/21 0943       Pre Biometrics   Height 5' 6.1" (1.679 m)    Weight 176 lb 9.6 oz (80.1 kg)    BMI (Calculated) 28.42    Single Leg Stand 9.31 seconds              Nutrition Therapy Plan and Nutrition Goals:  Nutrition Therapy & Goals - 08/20/21 1658       Nutrition Therapy   Diet Pulmonary MNT, Heart healthy, low Na    Protein (specify units) 95g    Fiber 25 grams    Whole Grain Foods 3 servings    Saturated Fats 12 max. grams    Fruits and Vegetables 8 servings/day    Sodium 1.5 grams      Personal Nutrition Goals   Nutrition Goal ST: include protein at each meal: examples: egg at breakfast, beans with vegetables when no meat for dinner, add snack likt peanut butter with fruit LT: meet protein needs, avoid feeling hungry throughout the day    Comments She reports having a good apetite. She enjoys fruits and vegetables. B: bowl of grits, or bagel before she gets running for the day L: no lunch or sandwich (meat sandwich) (white bread) D: she feels that she overeats and that she is very hungry. Vegetables (a variety including greens) she doesn't have much meat. She cooks fries in the aiGodfrey the man she lives with will complain - he doesn't like the healthy foods she makes. She reports having $900 a month for everything. Discussed general healthy eating, protein sources,  and pulmonary MNT. She also has cirrhosis of the liver in addition to COPD, discussed MNT.      Intervention Plan   Intervention Prescribe, educate and counsel regarding individualized specific dietary modifications aiming towards targeted core components such as weight, hypertension, lipid management, diabetes, heart failure and other comorbidities.    Expected Outcomes Short Term Goal: Understand basic principles of dietary content, such as calories, fat, sodium, cholesterol and nutrients.;Short Term Goal: A plan has been developed with personal nutrition goals set during dietitian appointment.;Long Term Goal: Adherence to prescribed nutrition plan.             Nutrition Assessments:  MEDIFICTS Score Key: ?70 Need to make dietary changes  40-70 Heart Healthy Diet ? 40 Therapeutic Level Cholesterol Diet  Flowsheet [facility]  Picture Your Plate Total Score on Admission 59      Picture Your Plate Scores: <4<41nhealthy dietary pattern with much room for improvement. 41-50 Dietary pattern unlikely to meet recommendations for good health and room for improvement. 51-60 More healthful dietary pattern, with some room for improvement.  >60 Healthy dietary pattern,  although there may be some specific behaviors that could be improved.   Nutrition Goals Re-Evaluation:  Nutrition Goals Re-Evaluation     [place] Name 08/16/21 [place]             Goals   Nutrition Goal Meet with the dietician       Comment Patient was informed on why it is important to maintain a balanced diet when dealing with Respiratory issues. Explained that it takes a lot of energy to breath and when they are short of breath often they will need to have a good diet to help keep up with the calories they are expending for breathing.       Expected Outcome Short: Choose and plan snacks accordingly to patients caloric intake to improve breathing. Long: Maintain a  diet independently that meets their caloric intake to aid in daily shortness of breath.                Nutrition Goals Discharge (Final Nutrition Goals Re-Evaluation):  Nutrition Goals Re-Evaluation - 08/16/21 1550       Goals   Nutrition Goal Meet with the dietician    Comment Patient was informed on why it is important to maintain a balanced diet when dealing with Respiratory issues. Explained that it takes a lot of energy to breath and when they are short of breath often they will need to have a good diet to help keep up with the calories they are expending for breathing.    Expected Outcome Short: Choose and plan snacks accordingly to patients caloric intake to improve breathing. Long: Maintain a diet independently that meets their caloric intake to aid in daily shortness of breath.             Psychosocial: Target Goals: Acknowledge presence or absence of significant depression and/or stress, maximize coping skills, provide positive support system. Participant is able to verbalize types and ability to use techniques and skills needed for reducing stress and depression.   Education: Stress, Anxiety, and Depression - Group verbal and visual presentation to define topics covered.  Reviews how body is impacted by stress, anxiety, and depression.  Also discusses healthy ways to reduce stress and to treat/manage anxiety and depression.  Written material given at graduation. Flowsheet Row Pulmonary Rehab from 08/22/2021 in [facility] Card[facility] and Pulmonary Rehab  Date 08/15/21  Educator AS  Instruction Review Code 1- Verbalizes Understanding       Education: Sleep Hygiene -Provides group verbal and written instruction about how sleep can affect your health.  Define sleep hygiene, discuss sleep cycles and impact of sleep habits. Review good sleep hygiene tips.    Initial Review & Psychosocial Screening:  Initial Psych Review & Screening - 07/17/21 1139       Initial Review    Current issues with Current Stress Concerns    Source of Stress Concerns Family    Comments Husband  demntia and in facility      [place]? Yes   daughter   close friend     Barriers   Psychosocial barriers to participate in program There are no identifiable barriers or psychosocial needs.;The patient should benefit from training in stress management and relaxation.      Screening Interventions   Interventions Encouraged to exercise;To provide support and resources with identified psychosocial needs;Provide feedback about the scores to participant    Expected Outcomes Short Term goal: Utilizing psychosocial counselor, staff and physician to assist with identification  of specific Stressors or current issues interfering with healing process. Setting desired goal for each stressor or current issue identified.;Long Term Goal: Stressors or current issues are controlled or eliminated.;Short Term goal: Identification and review with participant of any Quality of Life or Depression concerns found by scoring the questionnaire.;Long Term goal: The participant improves quality of Life and PHQ9 Scores as seen by post scores and/or verbalization of changes             Quality of Life Scores:  Scores of 19 and below usually indicate a poorer quality of life in these areas.  A difference of  2-3 points is a clinically meaningful difference.  A difference of 2-3 points in the total score of the Quality of Life Index has been associated with significant improvement in overall quality of life, self-image, physical symptoms, and general health in studies assessing change in quality of life.  PHQ-9: Recent Review Flowsheet Data     Depression screen [facility] 2/9 07/24/2021   Decreased Interest 0   Down, Depressed, Hopeless 0   PHQ - 2 Score 0   Altered sleeping 0   Tired, decreased energy 1   Change in appetite 2   Feeling bad or failure about yourself  0   Trouble concentrating  0   Moving slowly or fidgety/restless 0   Suicidal thoughts 0   PHQ-9 Score 3   Difficult doing work/chores Not difficult at all      Interpretation of Total Score  Total Score Depression Severity:  1-4 = Minimal depression, 5-9 = Mild depression, 10-14 = Moderate depression, 15-19 = Moderately severe depression, 20-27 = Severe depression   Psychosocial Evaluation and Intervention:  Psychosocial Evaluation - 07/17/21 1145       Psychosocial Evaluation & Interventions   Interventions Encouraged to exercise with the program and follow exercise prescription;Relaxation education;Stress management education    Comments Sala has no barriers to attending the program. She has support system of her daughter and a close friend. She does have significant stress as her husband has dementia and lives in a facility in [place]. She goes to see him daily. She has gained weight since quitting cigarettes in2017. She quit because she wanted to be here for her grandchild that was born on her birthday.  She wants to get back to her weight before she quit smoking.  She is ready to get started.    Expected Outcomes STG: Rickell will be able to attend all scheduled sessions, she will gain knowledge to help her reduce her stress. LTG: Shaila will continue to use what she learned in the program to help with her stress.    Continue Psychosocial Services  Follow up required by staff             Psychosocial Re-Evaluation:  Psychosocial Re-Evaluation     [place] Name 08/16/21 1555             Psychosocial Re-Evaluation   Current issues with None Identified       Comments Patient reports no issues with their current mental states, sleep, stress, depression or anxiety. Will follow up with patient in a few weeks for any changes.       Expected Outcomes Short: Continue to exercise regularly to support mental health and notify staff of any changes. Long: maintain mental health and well being through teaching  of rehab or prescribed medications independently.       Interventions Encouraged to attend Pulmonary Rehabilitation for the exercise  Continue Psychosocial Services  Follow up required by staff                Psychosocial Discharge (Final Psychosocial Re-Evaluation):  Psychosocial Re-Evaluation - 08/16/21 1555       Psychosocial Re-Evaluation   Current issues with None Identified    Comments Patient reports no issues with their current mental states, sleep, stress, depression or anxiety. Will follow up with patient in a few weeks for any changes.    Expected Outcomes Short: Continue to exercise regularly to support mental health and notify staff of any changes. Long: maintain mental health and well being through teaching of rehab or prescribed medications independently.    Interventions Encouraged to attend Pulmonary Rehabilitation for the exercise    Continue Psychosocial Services  Follow up required by staff             Education: Education Goals: Education classes will be provided on a weekly basis, covering required topics. Participant will state understanding/return demonstration of topics presented.  Learning Barriers/Preferences:   General Pulmonary Education Topics:  Infection Prevention: - Provides verbal and written material to individual with discussion of infection control including proper hand washing and proper equipment cleaning during exercise session. Flowsheet Row Pulmonary Rehab from 08/22/2021 in [facility] Cardiac and Pulmonary Rehab  Education need identified 07/24/21  Date 07/24/21  Educator [place]  Instruction Review Code 1- Verbalizes Understanding       Falls Prevention: - Provides verbal and written material to individual with discussion of falls prevention and safety. Flowsheet Row Pulmonary Rehab from 08/22/2021 in [facility] Cardiac and Pulmonary Rehab  Education need identified 07/24/21  Date 07/24/21  Educator [place]  Instruction Review Code 1-  Verbalizes Understanding       Chronic Lung Disease Review: - Group verbal instruction with posters, models, PowerPoint presentations and videos,  to review new updates, new respiratory medications, new advancements in procedures and treatments. Providing information on websites and "800" numbers for continued self-education. Includes information about supplement oxygen, available portable oxygen systems, continuous and intermittent flow rates, oxygen safety, concentrators, and Medicare reimbursement for oxygen. Explanation of Pulmonary Drugs, including class, frequency, complications, importance of spacers, rinsing mouth after steroid MDI's, and proper cleaning methods for nebulizers. Review of basic lung anatomy and physiology related to function, structure, and complications of lung disease. Review of risk factors. Discussion about methods for diagnosing sleep apnea and types of masks and machines for OSA. Includes a review of the use of types of environmental controls: home humidity, furnaces, filters, dust mite/pet prevention, HEPA vacuums. Discussion about weather changes, air quality and the benefits of nasal washing. Instruction on Warning signs, infection symptoms, calling MD promptly, preventive modes, and value of vaccinations. Review of effective airway clearance, coughing and/or vibration techniques. Emphasizing that all should Create an Action Plan. Written material given at graduation. Flowsheet Row Pulmonary Rehab from 08/22/2021 in [facility] Cardiac and Pulmonary Rehab  Education need identified 07/24/21  Date 08/08/21  Educator [facility]  Instruction Review Code 1- Verbalizes Understanding       AED/CPR: - Group verbal and written instruction with the use of models to demonstrate the basic use of the AED with the basic ABC's of resuscitation.    Anatomy and Cardiac Procedures: - Group verbal and visual presentation and models provide information about basic cardiac anatomy and function.  Reviews the testing methods done to diagnose heart disease and the outcomes of the test results. Describes the treatment choices: Medical Management, Angioplasty, or Coronary Bypass Surgery  for treating various heart conditions including Myocardial Infarction, Angina, Valve Disease, and Cardiac Arrhythmias.  Written material given at graduation.   Medication Safety: - Group verbal and visual instruction to review commonly prescribed medications for heart and lung disease. Reviews the medication, class of the drug, and side effects. Includes the steps to properly store meds and maintain the prescription regimen.  Written material given at graduation.   Other: -Provides group and verbal instruction on various topics (see comments)   Knowledge Questionnaire Score:  Knowledge Questionnaire Score - 07/24/21 0942       Knowledge Questionnaire Score   Pre Score 13/18: HR, ADL, Nutrition, O2              Core Components/Risk Factors/Patient Goals at Admission:  Personal Goals and Risk Factors at Admission - 07/24/21 1009       Core Components/Risk Factors/Patient Goals on Admission    Weight Management Yes;Weight Loss    Intervention Weight Management: Develop a combined nutrition and exercise program designed to reach desired caloric intake, while maintaining appropriate intake of nutrient and fiber, sodium and fats, and appropriate energy expenditure required for the weight goal.;Weight Management: Provide education and appropriate resources to help participant work on and attain dietary goals.;Weight Management/Obesity: Establish reasonable short term and long term weight goals.    Admit Weight 176 lb (79.8 kg)    Goal Weight: Short Term 170 lb (77.1 kg)    Goal Weight: Long Term 160 lb (72.6 kg)    Expected Outcomes Short Term: Continue to assess and modify interventions until short term weight is achieved;Long Term: Adherence to nutrition and physical activity/exercise program aimed  toward attainment of established weight goal;Weight Loss: Understanding of general recommendations for a balanced deficit meal plan, which promotes 1-2 lb weight loss per week and includes a negative energy balance of [phone] kcal/d;Understanding recommendations for meals to include 15-35% energy as protein, 25-35% energy from fat, 35-60% energy from carbohydrates, less than 28m of dietary cholesterol, 20-35 gm of total fiber daily;Understanding of distribution of calorie intake throughout the day with the consumption of 4-5 meals/snacks    Improve shortness of breath with ADL's Yes    Intervention Provide education, individualized exercise plan and daily activity instruction to help decrease symptoms of SOB with activities of daily living.    Expected Outcomes Short Term: Improve cardiorespiratory fitness to achieve a reduction of symptoms when performing ADLs;Long Term: Be able to perform more ADLs without symptoms or delay the onset of symptoms    Increase knowledge of respiratory medications and ability to use respiratory devices properly  Yes    Intervention Provide education and demonstration as needed of appropriate use of medications, inhalers, and oxygen therapy.    Expected Outcomes Short Term: Achieves understanding of medications use. Understands that oxygen is a medication prescribed by physician. Demonstrates appropriate use of inhaler and oxygen therapy.;Long Term: Maintain appropriate use of medications, inhalers, and oxygen therapy.             Education:Diabetes - Individual verbal and written instruction to review signs/symptoms of diabetes, desired ranges of glucose level fasting, after meals and with exercise. Acknowledge that pre and post exercise glucose checks will be done for 3 sessions at entry of program.   Know Your Numbers and Heart Failure: - Group verbal and visual instruction to discuss disease risk factors for cardiac and pulmonary disease and treatment options.   Reviews associated critical values for Overweight/Obesity, Hypertension, Cholesterol, and Diabetes.  Discusses basics of  heart failure: signs/symptoms and treatments.  Introduces Heart Failure Zone chart for action plan for heart failure.  Written material given at graduation. Flowsheet Row Pulmonary Rehab from 08/22/2021 in [facility] Cardiac and Pulmonary Rehab  Date 08/01/21  Educator [facility]  Instruction Review Code 1- Verbalizes Understanding       Core Components/Risk Factors/Patient Goals Review:   Goals and Risk Factor Review     [place] Name 08/16/21 1549             Core Components/Risk Factors/Patient Goals Review   Personal Goals Review Improve shortness of breath with ADL's       Review Spoke to patient about their shortness of breath and what they can do to improve. Patient has been informed of breathing techniques when starting the program. Patient is informed to tell staff if they have had any med changes and that certain meds they are taking or not taking can be causing shortness of breath.       Expected Outcomes Short: Attend [facility] regularly to improve shortness of breath with ADL's. Long: maintain independence with ADL's                Core Components/Risk Factors/Patient Goals at Discharge (Final Review):   Goals and Risk Factor Review - 08/16/21 1549       Core Components/Risk Factors/Patient Goals Review   Personal Goals Review Improve shortness of breath with ADL's    Review Spoke to patient about their shortness of breath and what they can do to improve. Patient has been informed of breathing techniques when starting the program. Patient is informed to tell staff if they have had any med changes and that certain meds they are taking or not taking can be causing shortness of breath.    Expected Outcomes Short: Attend [facility] regularly to improve shortness of breath with ADL's. Long: maintain independence with ADL's             ITP Comments:  ITP Comments      Row Name 07/17/21 1152 07/24/21 0937 07/30/21 1603 08/08/21 0830 08/20/21 1747   ITP Comments Virtual orientation call completed today. shehas an appointment on Date: 07/24/2021  for EP eval and gym Orientation.  Documentation of diagnosis can be found in [facility] Date: 06/14/2021 . Completed 6MWT and gym orientation. Initial ITP created and sent for review to Dr. Ottie Glazier, Medical Director. First full day of exercise!  Patient was oriented to gym and equipment including functions, settings, policies, and procedures.  Patient's individual exercise prescription and treatment plan were reviewed.  All starting workloads were established based on the results of the 6 minute walk test done at initial orientation visit.  The plan for exercise progression was also introduced and progression will be customized based on patient's performance and goals. 30 day review completed. ITP sent to Dr. Zetta Bills, Medical Director of Pulmonary Rehab. Continue with ITP unless changes are made by physician. Completed initial RD consultation    Row Name 09/05/21 0656 09/10/21 1208 09/19/21 1647 10/02/21 0731 10/03/21 0642   ITP Comments 30 Day review completed. Medical Director ITP review done, changes made as directed, and signed approval by Medical Director. Pt called to let us know that she has double pneumonia. She will continue to be out until feeling better. Zeynep has not attended since last review. Myrissa last attended rehab 11/2 which was the first day of the 30-day review cycle - unable to get goals. We have been unable to get in contact  with [facility] and have sent a letter to be discharged 12/9. 30 Day review completed. Medical Director ITP review done, changes made as directed, and signed approval by Medical Director.    [place] Name 10/11/21 0843           ITP Comments No response back from patient. Discharge at this time                Comments: Discharge ITP

## 2021-10-11 NOTE — Progress Notes (Signed)
Discharge Progress Report  Patient Details  Name: Bailey Maldonado MRN: [id number] Date of Birth: 09-03-1961 Referring Provider:   Flowsheet Row Pulmonary Rehab from 07/24/2021 in [facility]  Referring Provider Dyke Brackett MD        Number of Visits: 16  Reason for Discharge:  Early Exit:  Lack of attendance  Smoking History:  Social History   Tobacco Use  Smoking Status Former   Packs/day: 1.50   Years: 35.00   Pack years: 52.50   Types: Cigarettes   Quit date: 11/05/2015   Years since quitting: 5.9  Smokeless Tobacco Never    Diagnosis:  No diagnosis found.  ADL [facility]:  Pulmonary Assessment Scores     Row Name 07/24/21 [id number] 07/30/21 1654       ADL [facility]SD   ADL Phase Entry --    SOB Score total 21 --    Rest 0 --    Walk 1 --    Stairs 5 --    Bath 0 --    Dress 0 --    Shop 1 --      CAT Score   CAT Score 11 --      mMRC Score   mMRC Score -- 1             Initial Exercise Prescription:  Initial Exercise Prescription - 07/24/21 1000       Date of Initial Exercise RX and Referring Provider   Date 07/24/21    Referring Provider Dyke Brackett MD      Treadmill   MPH 2.6    Grade 0.5    Minutes 15    METs 3.17      Recumbant Bike   Level 3    RPM 60    Watts 30    Minutes 15    METs 3.4      NuStep   Level 3    SPM 80    Minutes 15    METs 3.4      REL-XR   Level 2    Speed 50    Minutes 15    METs 3.4      Prescription Details   Frequency (times per week) 3    Duration Progress to 30 minutes of continuous aerobic without signs/symptoms of physical distress      Intensity   THRR 40-80% of Max Heartrate 113-146    Ratings of Perceived Exertion 11-13    Perceived Dyspnea 0-4      Progression   Progression Continue to progress workloads to maintain intensity without signs/symptoms of physical distress.      Resistance Training   Training Prescription Yes    Weight 3 lb    Reps 10-15              Discharge Exercise Prescription (Final Exercise Prescription Changes):  Exercise Prescription Changes - 09/03/21 1200       Response to Exercise   Blood Pressure (Admit) 112/60    Blood Pressure (Exit) 126/64    Heart Rate (Admit) 86 bpm    Heart Rate (Exercise) 109 bpm    Heart Rate (Exit) 90 bpm    Oxygen Saturation (Admit) 96 %    Oxygen Saturation (Exercise) 94 %    Oxygen Saturation (Exit) 96 %    Rating of Perceived Exertion (Exercise) 15    Perceived Dyspnea (Exercise) 2    Symptoms SOB    Duration Continue with 30 min of aerobic exercise  without signs/symptoms of physical distress.    Intensity THRR unchanged      Progression   Progression Continue to progress workloads to maintain intensity without signs/symptoms of physical distress.    Average METs 3.19      Resistance Training   Training Prescription Yes    Weight 5 lb    Reps 10-15      Interval Training   Interval Training No      Treadmill   MPH 2.6    Grade 1    Minutes 15    METs 3.35      Recumbant Bike   Level 5    Watts 19    Minutes 15      NuStep   Level 3    Minutes 15    METs 2.5      REL-XR   Level 3    Minutes 15      Oxygen   Maintain Oxygen Saturation 88% or higher             Functional Capacity:  6 Minute Walk     Row Name 07/24/21 0954         6 Minute Walk   Phase Initial     Distance 1300 feet     Walk Time 6 minutes     # of Rest Breaks 0     MPH 2.46     METS 3.44     RPE 7     Perceived Dyspnea  1     VO2 Peak 12.06     Symptoms Yes (comment)     Comments Slight SOB     Resting HR 80 bpm     Resting BP 102/62     Resting Oxygen Saturation  97 %     Exercise Oxygen Saturation  during 6 min walk 89 %     Max Ex. HR 106 bpm     Max Ex. BP 112/66     2 Minute Post BP 100/62       Interval HR   1 Minute HR 97     2 Minute HR 102     3 Minute HR 103     4 Minute HR 105     5 Minute HR 106     6 Minute HR 106     2 Minute Post HR  86     Interval Heart Rate? Yes       Interval Oxygen   Interval Oxygen? Yes     Baseline Oxygen Saturation % 97 %     1 Minute Oxygen Saturation % 94 %     1 Minute Liters of Oxygen 0 L  RA     2 Minute Oxygen Saturation % 93 %     2 Minute Liters of Oxygen 0 L     3 Minute Oxygen Saturation % 91 %     3 Minute Liters of Oxygen 0 L     4 Minute Oxygen Saturation % 89 %     4 Minute Liters of Oxygen 0 L     5 Minute Oxygen Saturation % 90 %     5 Minute Liters of Oxygen 0 L     6 Minute Oxygen Saturation % 90 %     6 Minute Liters of Oxygen 0 L     2 Minute Post Oxygen Saturation % 93 %     2 Minute Post Liters of Oxygen 0 L  Psychological, QOL, Others - Outcomes: PHQ 2/9: Depression screen PHQ 2/9 07/24/2021  Decreased Interest 0  Down, Depressed, Hopeless 0  PHQ - 2 Score 0  Altered sleeping 0  Tired, decreased energy 1  Change in appetite 2  Feeling bad or failure about yourself  0  Trouble concentrating 0  Moving slowly or fidgety/restless 0  Suicidal thoughts 0  PHQ-9 Score 3  Difficult doing work/chores Not difficult at all      Nutrition & Weight - Outcomes:  Pre Biometrics - 07/24/21 0943       Pre Biometrics   Height 5' 6.1" (1.679 m)    Weight 176 lb 9.6 oz (80.1 kg)    BMI (Calculated) 28.42    Single Leg Stand 9.31 seconds              Nutrition:  Nutrition Therapy & Goals - 08/20/21 1658       Nutrition Therapy   Diet Pulmonary MNT, Heart healthy, low Na    Protein (specify units) 95g    Fiber 25 grams    Whole Grain Foods 3 servings    Saturated Fats 12 max. grams    Fruits and Vegetables 8 servings/day    Sodium 1.5 grams      Personal Nutrition Goals   Nutrition Goal ST: include protein at each meal: examples: egg at breakfast, beans with vegetables when no meat for dinner, add snack likt peanut butter with fruit LT: meet protein needs, avoid feeling hungry throughout the day    Comments She reports having a good  apetite. She enjoys fruits and vegetables. B: bowl of grits, or bagel before she gets running for the day L: no lunch or sandwich (meat sandwich) (white bread) D: she feels that she overeats and that she is very hungry. Vegetables (a variety including greens) she doesn't have much meat. 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 She reports having $900 a month for everything. Discussed general healthy eating, protein sources, and pulmonary MNT. She also has cirrhosis of the liver in addition to COPD, discussed MNT.      Intervention Plan   Intervention Prescribe, educate and counsel regarding individualized specific dietary modifications aiming towards targeted core components such as weight, hypertension, lipid management, diabetes, heart failure and other comorbidities.    Expected Outcomes Short Term Goal: Understand basic principles of dietary content, such as calories, fat, sodium, cholesterol and nutrients.;Short Term Goal: A plan has been developed with personal nutrition goals set during dietitian appointment.;Long Term Goal: Adherence to prescribed nutrition plan.              Goals reviewed with patient; copy given to patient.

## 2021-10-24 ENCOUNTER — Ambulatory Visit: Admit: 2021-10-24 | Discharge: 2021-10-25 | Payer: PRIVATE HEALTH INSURANCE

## 2021-10-25 DIAGNOSIS — E611 Iron deficiency: Principal | ICD-10-CM

## 2021-10-25 DIAGNOSIS — G2581 Restless legs syndrome: Principal | ICD-10-CM

## 2021-10-29 MED ORDER — URSODIOL 300 MG CAPSULE
ORAL_CAPSULE | 0 refills | 0 days
Start: 2021-10-29 — End: ?

## 2021-10-31 MED ORDER — SPIRONOLACTONE 100 MG TABLET
ORAL_TABLET | Freq: Every day | ORAL | 3 refills | 90.00000 days | Status: CP
Start: 2021-10-31 — End: 2022-10-31

## 2021-10-31 MED ORDER — ADAPALENE 0.1 % LOTION
Freq: Every day | TOPICAL | 2 refills | 0 days | Status: CP
Start: 2021-10-31 — End: 2022-01-29

## 2021-11-01 MED ORDER — URSODIOL 300 MG CAPSULE
ORAL_CAPSULE | 4 refills | 0 days | Status: CP
Start: 2021-11-01 — End: 2022-11-01

## 2021-11-09 ENCOUNTER — Ambulatory Visit: Admit: 2021-11-09 | Discharge: 2021-11-10 | Payer: PRIVATE HEALTH INSURANCE

## 2021-11-09 ENCOUNTER — Other Ambulatory Visit: Admit: 2021-11-09 | Discharge: 2021-11-10 | Payer: PRIVATE HEALTH INSURANCE

## 2021-11-09 DIAGNOSIS — G2581 Restless legs syndrome: Principal | ICD-10-CM

## 2021-11-09 DIAGNOSIS — E611 Iron deficiency: Principal | ICD-10-CM

## 2021-11-30 ENCOUNTER — Ambulatory Visit
Admit: 2021-11-30 | Discharge: 2021-12-01 | Disposition: A | Discharge status: Home / Self Care | Payer: PRIVATE HEALTH INSURANCE

## 2021-12-01 MED ORDER — AMOXICILLIN 875 MG-POTASSIUM CLAVULANATE 125 MG TABLET
ORAL_TABLET | Freq: Two times a day (BID) | ORAL | 0 refills | 7 days | Status: CP
Start: 2021-12-01 — End: 2021-12-08

## 2021-12-10 ENCOUNTER — Ambulatory Visit
Admit: 2021-12-10 | Discharge: 2021-12-10 | Disposition: A | Discharge status: Home / Self Care | Payer: PRIVATE HEALTH INSURANCE

## 2021-12-10 DIAGNOSIS — Z4802 Encounter for removal of sutures: Principal | ICD-10-CM

## 2021-12-13 ENCOUNTER — Ambulatory Visit: Admit: 2021-12-13 | Discharge: 2021-12-13 | Payer: PRIVATE HEALTH INSURANCE

## 2021-12-13 DIAGNOSIS — R300 Dysuria: Principal | ICD-10-CM

## 2021-12-13 DIAGNOSIS — N903 Dysplasia of vulva, unspecified: Principal | ICD-10-CM

## 2021-12-13 MED ORDER — ADAPALENE 0.1 % TOPICAL CREAM
Freq: Every evening | TOPICAL | 11 refills | 0 days | Status: CP
Start: 2021-12-13 — End: 2022-12-13

## 2021-12-15 DIAGNOSIS — N3000 Acute cystitis without hematuria: Principal | ICD-10-CM

## 2021-12-15 MED ORDER — NITROFURANTOIN MONOHYDRATE/MACROCRYSTALS 100 MG CAPSULE
ORAL_CAPSULE | Freq: Two times a day (BID) | ORAL | 0 refills | 7 days | Status: CP
Start: 2021-12-15 — End: 2021-12-22

## 2021-12-20 ENCOUNTER — Ambulatory Visit: Admit: 2021-12-20 | Discharge: 2021-12-21 | Payer: PRIVATE HEALTH INSURANCE

## 2021-12-25 ENCOUNTER — Ambulatory Visit: Admit: 2021-12-25 | Discharge: 2021-12-25 | Payer: PRIVATE HEALTH INSURANCE

## 2021-12-27 ENCOUNTER — Ambulatory Visit: Admit: 2021-12-27 | Discharge: 2021-12-27 | Payer: PRIVATE HEALTH INSURANCE

## 2022-01-24 ENCOUNTER — Ambulatory Visit: Admit: 2022-01-24 | Discharge: 2022-01-25 | Payer: PRIVATE HEALTH INSURANCE

## 2022-02-07 ENCOUNTER — Ambulatory Visit: Admit: 2022-02-07 | Discharge: 2022-02-08 | Payer: PRIVATE HEALTH INSURANCE

## 2022-02-07 MED ORDER — FLUCONAZOLE 150 MG TABLET
ORAL_TABLET | Freq: Once | ORAL | 0 refills | 2 days | Status: CP
Start: 2022-02-07 — End: 2022-02-07

## 2022-02-07 MED ORDER — FLUTICASONE PROPIONATE 0.005 % TOPICAL OINTMENT
Freq: Two times a day (BID) | TOPICAL | 2 refills | 0 days | Status: CP
Start: 2022-02-07 — End: ?

## 2022-02-07 MED ORDER — OMEPRAZOLE 20 MG CAPSULE,DELAYED RELEASE
ORAL_CAPSULE | Freq: Every day | ORAL | 0 refills | 30 days | Status: CP | PRN
Start: 2022-02-07 — End: 2022-03-09

## 2022-02-07 MED ORDER — ESCITALOPRAM 10 MG TABLET
ORAL_TABLET | ORAL | 0 refills | 30 days | Status: CP
Start: 2022-02-07 — End: 2022-03-09

## 2022-02-19 ENCOUNTER — Ambulatory Visit: Admit: 2022-02-19 | Discharge: 2022-02-20 | Payer: PRIVATE HEALTH INSURANCE

## 2022-02-19 DIAGNOSIS — R233 Spontaneous ecchymoses: Principal | ICD-10-CM

## 2022-02-19 DIAGNOSIS — D492 Neoplasm of unspecified behavior of bone, soft tissue, and skin: Principal | ICD-10-CM

## 2022-02-19 DIAGNOSIS — L409 Psoriasis, unspecified: Principal | ICD-10-CM

## 2022-02-19 MED ORDER — CLOBETASOL 0.05 % TOPICAL FOAM
Freq: Two times a day (BID) | TOPICAL | 4 refills | 0 days | Status: CP
Start: 2022-02-19 — End: 2023-02-19

## 2022-03-07 ENCOUNTER — Ambulatory Visit: Admit: 2022-03-07 | Discharge: 2022-03-08 | Payer: PRIVATE HEALTH INSURANCE

## 2022-03-09 MED ORDER — ESCITALOPRAM 10 MG TABLET
ORAL_TABLET | Freq: Every day | ORAL | 3 refills | 90 days | Status: CP
Start: 2022-03-09 — End: 2022-06-07

## 2022-03-15 ENCOUNTER — Ambulatory Visit: Admit: 2022-03-15 | Discharge: 2022-03-16 | Payer: PRIVATE HEALTH INSURANCE

## 2022-03-15 MED ORDER — ESCITALOPRAM 5 MG TABLET
ORAL_TABLET | Freq: Every day | ORAL | 3 refills | 90 days | Status: CP
Start: 2022-03-15 — End: 2023-03-15

## 2022-03-15 MED ORDER — ADAPALENE 0.3 % TOPICAL GEL WITH PUMP
Freq: Every day | TOPICAL | 11 refills | 0 days | Status: CP
Start: 2022-03-15 — End: 2023-03-15

## 2022-03-18 DIAGNOSIS — Z1231 Encounter for screening mammogram for malignant neoplasm of breast: Principal | ICD-10-CM

## 2022-03-28 ENCOUNTER — Ambulatory Visit
Admit: 2022-03-28 | Discharge: 2022-03-29 | Payer: PRIVATE HEALTH INSURANCE | Attending: Hematology & Oncology | Primary: Hematology & Oncology

## 2022-03-28 DIAGNOSIS — D696 Thrombocytopenia, unspecified: Principal | ICD-10-CM

## 2022-03-28 DIAGNOSIS — R701 Abnormal plasma viscosity: Principal | ICD-10-CM

## 2022-03-28 DIAGNOSIS — D708 Other neutropenia: Principal | ICD-10-CM

## 2022-04-16 ENCOUNTER — Ambulatory Visit: Admit: 2022-04-16 | Discharge: 2022-04-17 | Payer: PRIVATE HEALTH INSURANCE

## 2022-04-16 DIAGNOSIS — D099 Carcinoma in situ, unspecified: Principal | ICD-10-CM

## 2022-04-16 DIAGNOSIS — L820 Inflamed seborrheic keratosis: Principal | ICD-10-CM

## 2022-05-02 DIAGNOSIS — R21 Rash and other nonspecific skin eruption: Principal | ICD-10-CM

## 2022-05-02 MED ORDER — DOXYCYCLINE HYCLATE 100 MG CAPSULE
ORAL_CAPSULE | 0 refills | 0 days | Status: CP
Start: 2022-05-02 — End: ?

## 2022-05-03 DIAGNOSIS — R21 Rash and other nonspecific skin eruption: Principal | ICD-10-CM

## 2022-05-03 MED ORDER — MUPIROCIN 2 % TOPICAL OINTMENT
1 refills | 0 days | Status: CP
Start: 2022-05-03 — End: ?

## 2022-05-15 ENCOUNTER — Ambulatory Visit: Admit: 2022-05-15 | Discharge: 2022-05-16 | Payer: PRIVATE HEALTH INSURANCE

## 2022-08-15 DIAGNOSIS — J449 Chronic obstructive pulmonary disease, unspecified: Principal | ICD-10-CM

## 2022-08-15 MED ORDER — SPIRIVA RESPIMAT 2.5 MCG/ACTUATION SOLUTION FOR INHALATION
Freq: Every day | RESPIRATORY_TRACT | 11 refills | 14 days | Status: CP
Start: 2022-08-15 — End: ?

## 2022-08-15 MED ORDER — FLUTICASONE 250 MCG-SALMETEROL 50 MCG/DOSE BLISTR POWDR FOR INHALATION
RESPIRATORY_TRACT | 4 refills | 0 days | Status: CP
Start: 2022-08-15 — End: 2023-08-15

## 2022-08-19 ENCOUNTER — Emergency Department
Admit: 2022-08-19 | Discharge: 2022-08-19 | Disposition: A | Discharge status: Home / Self Care | Payer: PRIVATE HEALTH INSURANCE | Attending: Emergency Medicine

## 2022-08-19 ENCOUNTER — Ambulatory Visit
Admit: 2022-08-19 | Discharge: 2022-08-19 | Disposition: A | Discharge status: Home / Self Care | Payer: PRIVATE HEALTH INSURANCE | Attending: Emergency Medicine

## 2022-08-19 DIAGNOSIS — J40 Bronchitis, not specified as acute or chronic: Principal | ICD-10-CM

## 2022-08-19 DIAGNOSIS — J449 Chronic obstructive pulmonary disease, unspecified: Principal | ICD-10-CM

## 2022-08-19 DIAGNOSIS — D709 Neutropenia, unspecified: Principal | ICD-10-CM

## 2022-08-19 MED ORDER — PREDNISONE 20 MG TABLET
ORAL_TABLET | 0 refills | 0 days | Status: CP
Start: 2022-08-19 — End: ?

## 2022-08-19 MED ORDER — ALBUTEROL SULFATE 2.5 MG/3 ML (0.083 %) SOLUTION FOR NEBULIZATION
RESPIRATORY_TRACT | 2 refills | 5 days | Status: CP | PRN
Start: 2022-08-19 — End: 2023-08-19

## 2022-08-19 MED ORDER — AZITHROMYCIN 250 MG TABLET
ORAL_TABLET | ORAL | 0 refills | 5 days | Status: CP
Start: 2022-08-19 — End: 2022-08-25

## 2022-09-10 DIAGNOSIS — B3731 Yeast vaginitis: Principal | ICD-10-CM

## 2022-09-10 MED ORDER — FLUCONAZOLE 150 MG TABLET
ORAL_TABLET | Freq: Once | ORAL | 0 refills | 1 days | Status: CP
Start: 2022-09-10 — End: 2022-09-10

## 2022-10-17 MED ORDER — BENZONATATE 100 MG CAPSULE
ORAL_CAPSULE | Freq: Four times a day (QID) | ORAL | 1 refills | 8 days | Status: CP | PRN
Start: 2022-10-17 — End: 2023-10-17

## 2022-10-17 MED ORDER — PREDNISONE 20 MG TABLET
ORAL_TABLET | Freq: Every day | ORAL | 0 refills | 5 days | Status: CP
Start: 2022-10-17 — End: 2022-10-22

## 2022-10-22 ENCOUNTER — Ambulatory Visit: Admit: 2022-10-22 | Discharge: 2022-10-23 | Payer: PRIVATE HEALTH INSURANCE

## 2022-10-22 ENCOUNTER — Emergency Department
Admit: 2022-10-22 | Discharge: 2022-10-22 | Disposition: A | Discharge status: Home / Self Care | Payer: PRIVATE HEALTH INSURANCE | Attending: Emergency Medicine

## 2022-10-22 ENCOUNTER — Ambulatory Visit
Admit: 2022-10-22 | Discharge: 2022-10-22 | Disposition: A | Discharge status: Home / Self Care | Payer: PRIVATE HEALTH INSURANCE | Attending: Emergency Medicine

## 2022-10-22 DIAGNOSIS — L409 Psoriasis, unspecified: Principal | ICD-10-CM

## 2022-10-22 DIAGNOSIS — D1801 Hemangioma of skin and subcutaneous tissue: Principal | ICD-10-CM

## 2022-10-22 DIAGNOSIS — J441 Chronic obstructive pulmonary disease with (acute) exacerbation: Principal | ICD-10-CM

## 2022-10-22 DIAGNOSIS — B078 Other viral warts: Principal | ICD-10-CM

## 2022-10-22 DIAGNOSIS — Z85828 Personal history of other malignant neoplasm of skin: Principal | ICD-10-CM

## 2022-10-22 DIAGNOSIS — L814 Other melanin hyperpigmentation: Principal | ICD-10-CM

## 2022-10-22 DIAGNOSIS — D229 Melanocytic nevi, unspecified: Principal | ICD-10-CM

## 2022-10-22 DIAGNOSIS — L821 Other seborrheic keratosis: Principal | ICD-10-CM

## 2022-10-22 MED ORDER — PREDNISONE 20 MG TABLET
ORAL_TABLET | 0 refills | 0 days | Status: CP
Start: 2022-10-22 — End: ?

## 2022-11-07 ENCOUNTER — Ambulatory Visit
Admit: 2022-11-07 | Discharge: 2022-11-07 | Disposition: A | Discharge status: Home / Self Care | Payer: PRIVATE HEALTH INSURANCE

## 2022-11-07 DIAGNOSIS — R1030 Lower abdominal pain, unspecified: Principal | ICD-10-CM

## 2022-11-14 ENCOUNTER — Ambulatory Visit: Admit: 2022-11-14 | Discharge: 2022-11-15 | Payer: PRIVATE HEALTH INSURANCE

## 2022-11-14 MED ORDER — PREDNISONE 10 MG TABLET
ORAL_TABLET | ORAL | 0 refills | 12 days | Status: CP
Start: 2022-11-14 — End: 2022-11-26

## 2022-11-15 DIAGNOSIS — N760 Acute vaginitis: Principal | ICD-10-CM

## 2022-11-15 DIAGNOSIS — E611 Iron deficiency: Principal | ICD-10-CM

## 2022-11-15 DIAGNOSIS — G2581 Restless legs syndrome: Principal | ICD-10-CM

## 2022-11-15 DIAGNOSIS — J449 Chronic obstructive pulmonary disease, unspecified: Principal | ICD-10-CM

## 2022-11-15 DIAGNOSIS — K7460 Unspecified cirrhosis of liver: Principal | ICD-10-CM

## 2022-11-15 DIAGNOSIS — B9689 Other specified bacterial agents as the cause of diseases classified elsewhere: Principal | ICD-10-CM

## 2022-11-15 MED ORDER — METRONIDAZOLE 0.75 % (37.5 MG/5 GRAM) VAGINAL GEL
Freq: Every day | VAGINAL | 0 refills | 5.00000 days | Status: CP
Start: 2022-11-15 — End: 2022-11-15

## 2022-11-20 DIAGNOSIS — J449 Chronic obstructive pulmonary disease, unspecified: Principal | ICD-10-CM

## 2022-11-20 MED ORDER — PREDNISONE 10 MG TABLET
ORAL_TABLET | ORAL | 0 refills | 12 days | Status: CP
Start: 2022-11-20 — End: 2022-12-02

## 2022-11-28 DIAGNOSIS — J449 Chronic obstructive pulmonary disease, unspecified: Principal | ICD-10-CM

## 2022-12-02 ENCOUNTER — Institutional Professional Consult (permissible substitution)
Admit: 2022-12-02 | Discharge: 2022-12-03 | Payer: PRIVATE HEALTH INSURANCE | Attending: Student in an Organized Health Care Education/Training Program | Primary: Student in an Organized Health Care Education/Training Program

## 2022-12-02 MED ORDER — FLUTICASONE PROPIONATE 50 MCG/ACTUATION NASAL SPRAY,SUSPENSION
Freq: Two times a day (BID) | NASAL | 11 refills | 60 days | Status: CP
Start: 2022-12-02 — End: 2023-01-01

## 2022-12-12 ENCOUNTER — Ambulatory Visit: Admit: 2022-12-12 | Discharge: 2022-12-13 | Payer: PRIVATE HEALTH INSURANCE

## 2022-12-12 DIAGNOSIS — N903 Dysplasia of vulva, unspecified: Principal | ICD-10-CM

## 2022-12-13 DIAGNOSIS — J449 Chronic obstructive pulmonary disease, unspecified: Principal | ICD-10-CM

## 2022-12-13 MED ORDER — VENTOLIN HFA 90 MCG/ACTUATION AEROSOL INHALER
Freq: Four times a day (QID) | RESPIRATORY_TRACT | 5 refills | 0 days | Status: CP | PRN
Start: 2022-12-13 — End: 2023-12-13

## 2022-12-18 DIAGNOSIS — D071 Carcinoma in situ of vulva: Principal | ICD-10-CM

## 2022-12-26 ENCOUNTER — Ambulatory Visit: Admit: 2022-12-26 | Discharge: 2022-12-27 | Payer: PRIVATE HEALTH INSURANCE

## 2022-12-26 DIAGNOSIS — D071 Carcinoma in situ of vulva: Principal | ICD-10-CM

## 2022-12-26 DIAGNOSIS — R87612 Low grade squamous intraepithelial lesion on cytologic smear of cervix (LGSIL): Principal | ICD-10-CM

## 2023-01-01 MED ORDER — OMEPRAZOLE MAGNESIUM 20 MG TABLET,DELAYED RELEASE
ORAL_TABLET | Freq: Every day | ORAL | 3 refills | 90 days | Status: CP
Start: 2023-01-01 — End: 2024-01-01

## 2023-01-01 MED ORDER — AMOXICILLIN 875 MG-POTASSIUM CLAVULANATE 125 MG TABLET
ORAL_TABLET | Freq: Two times a day (BID) | ORAL | 0 refills | 5 days | Status: CP
Start: 2023-01-01 — End: 2023-01-06

## 2023-01-01 MED ORDER — PREDNISONE 10 MG TABLET
ORAL_TABLET | ORAL | 0 refills | 12 days | Status: CP
Start: 2023-01-01 — End: 2023-01-12

## 2023-01-01 MED ORDER — VENTOLIN HFA 90 MCG/ACTUATION AEROSOL INHALER
Freq: Four times a day (QID) | RESPIRATORY_TRACT | 5 refills | 0 days | Status: CP | PRN
Start: 2023-01-01 — End: 2024-01-01

## 2023-01-02 ENCOUNTER — Ambulatory Visit: Admit: 2023-01-02 | Discharge: 2023-01-01 | Payer: PRIVATE HEALTH INSURANCE

## 2023-01-15 ENCOUNTER — Ambulatory Visit: Admit: 2023-01-15 | Discharge: 2023-01-16 | Payer: PRIVATE HEALTH INSURANCE

## 2023-01-23 ENCOUNTER — Ambulatory Visit: Admit: 2023-01-23 | Discharge: 2023-01-24 | Payer: PRIVATE HEALTH INSURANCE

## 2023-01-23 DIAGNOSIS — J449 Chronic obstructive pulmonary disease, unspecified: Principal | ICD-10-CM

## 2023-01-28 ENCOUNTER — Ambulatory Visit: Admit: 2023-01-28 | Discharge: 2023-01-29 | Payer: PRIVATE HEALTH INSURANCE

## 2023-01-31 ENCOUNTER — Ambulatory Visit: Admit: 2023-01-31 | Discharge: 2023-02-01 | Payer: PRIVATE HEALTH INSURANCE

## 2023-02-10 MED ORDER — PREDNISONE 20 MG TABLET
ORAL_TABLET | Freq: Every day | ORAL | 0 refills | 5 days | Status: CP
Start: 2023-02-10 — End: 2023-02-15

## 2023-02-11 MED ORDER — ROFLUMILAST 250 MCG TABLET
ORAL_TABLET | ORAL | 0 refills | 30 days | Status: CP
Start: 2023-02-11 — End: 2023-03-13

## 2023-03-04 ENCOUNTER — Emergency Department
Admit: 2023-03-04 | Discharge: 2023-03-05 | Disposition: A | Discharge status: Home / Self Care | Payer: PRIVATE HEALTH INSURANCE | Attending: Emergency Medicine

## 2023-03-04 ENCOUNTER — Ambulatory Visit
Admit: 2023-03-04 | Discharge: 2023-03-05 | Disposition: A | Discharge status: Home / Self Care | Payer: PRIVATE HEALTH INSURANCE | Attending: Emergency Medicine

## 2023-03-04 MED ORDER — BENZONATATE 100 MG CAPSULE
ORAL_CAPSULE | Freq: Three times a day (TID) | ORAL | 0 refills | 7 days | Status: CP | PRN
Start: 2023-03-04 — End: 2023-03-11

## 2023-03-04 MED ORDER — PREDNISONE 10 MG TABLET
ORAL_TABLET | ORAL | 0 refills | 9 days | Status: CP
Start: 2023-03-04 — End: 2023-03-13

## 2023-03-05 MED ORDER — DOXYCYCLINE HYCLATE 100 MG CAPSULE
ORAL_CAPSULE | Freq: Two times a day (BID) | ORAL | 0 refills | 5 days | Status: CP
Start: 2023-03-05 — End: 2023-03-10

## 2023-03-10 DIAGNOSIS — J449 Chronic obstructive pulmonary disease, unspecified: Principal | ICD-10-CM

## 2023-03-10 MED ORDER — FLUTICASONE 250 MCG-SALMETEROL 50 MCG/DOSE BLISTR POWDR FOR INHALATION
Freq: Two times a day (BID) | RESPIRATORY_TRACT | 1 refills | 90 days | Status: CP
Start: 2023-03-10 — End: ?

## 2023-04-03 ENCOUNTER — Ambulatory Visit
Admit: 2023-04-03 | Discharge: 2023-04-04 | Payer: PRIVATE HEALTH INSURANCE | Attending: Hematology & Oncology | Primary: Hematology & Oncology

## 2023-04-03 ENCOUNTER — Emergency Department
Admit: 2023-04-03 | Discharge: 2023-04-03 | Disposition: A | Discharge status: Home / Self Care | Payer: PRIVATE HEALTH INSURANCE

## 2023-04-03 ENCOUNTER — Ambulatory Visit
Admit: 2023-04-03 | Discharge: 2023-04-03 | Disposition: A | Discharge status: Home / Self Care | Payer: PRIVATE HEALTH INSURANCE

## 2023-04-03 DIAGNOSIS — J449 Chronic obstructive pulmonary disease, unspecified: Principal | ICD-10-CM

## 2023-04-03 DIAGNOSIS — R0602 Shortness of breath: Principal | ICD-10-CM

## 2023-04-03 DIAGNOSIS — D708 Other neutropenia: Principal | ICD-10-CM

## 2023-04-03 MED ORDER — VENTOLIN HFA 90 MCG/ACTUATION AEROSOL INHALER
Freq: Four times a day (QID) | RESPIRATORY_TRACT | 5 refills | 0 days | Status: CP | PRN
Start: 2023-04-03 — End: 2024-04-02

## 2023-04-03 MED ORDER — BENZONATATE 100 MG CAPSULE
ORAL_CAPSULE | Freq: Three times a day (TID) | ORAL | 0 refills | 7.00000 days | Status: CP
Start: 2023-04-03 — End: 2023-04-10

## 2023-04-04 ENCOUNTER — Ambulatory Visit: Admit: 2023-04-04 | Discharge: 2023-04-05 | Payer: PRIVATE HEALTH INSURANCE

## 2023-04-22 ENCOUNTER — Ambulatory Visit: Admit: 2023-04-22 | Discharge: 2023-04-23 | Payer: PRIVATE HEALTH INSURANCE

## 2023-04-22 DIAGNOSIS — L821 Other seborrheic keratosis: Principal | ICD-10-CM

## 2023-04-22 DIAGNOSIS — D1801 Hemangioma of skin and subcutaneous tissue: Principal | ICD-10-CM

## 2023-04-22 DIAGNOSIS — L578 Other skin changes due to chronic exposure to nonionizing radiation: Principal | ICD-10-CM

## 2023-04-22 DIAGNOSIS — Z85828 Personal history of other malignant neoplasm of skin: Principal | ICD-10-CM

## 2023-04-22 DIAGNOSIS — B078 Other viral warts: Principal | ICD-10-CM

## 2023-04-22 DIAGNOSIS — D229 Melanocytic nevi, unspecified: Principal | ICD-10-CM

## 2023-04-22 DIAGNOSIS — L814 Other melanin hyperpigmentation: Principal | ICD-10-CM

## 2023-04-29 DIAGNOSIS — Z1231 Encounter for screening mammogram for malignant neoplasm of breast: Principal | ICD-10-CM

## 2023-05-15 ENCOUNTER — Ambulatory Visit: Admit: 2023-05-15 | Discharge: 2023-05-16 | Payer: PRIVATE HEALTH INSURANCE

## 2023-05-15 DIAGNOSIS — Z720 Tobacco use: Principal | ICD-10-CM

## 2023-06-25 ENCOUNTER — Ambulatory Visit: Admit: 2023-06-25 | Discharge: 2023-06-26 | Payer: PRIVATE HEALTH INSURANCE

## 2023-06-25 DIAGNOSIS — R928 Other abnormal and inconclusive findings on diagnostic imaging of breast: Principal | ICD-10-CM

## 2023-07-03 ENCOUNTER — Ambulatory Visit: Admit: 2023-07-03 | Discharge: 2023-07-04 | Payer: PRIVATE HEALTH INSURANCE

## 2023-07-03 DIAGNOSIS — D071 Carcinoma in situ of vulva: Principal | ICD-10-CM

## 2023-07-03 DIAGNOSIS — N903 Dysplasia of vulva, unspecified: Principal | ICD-10-CM

## 2023-07-03 DIAGNOSIS — R87612 Low grade squamous intraepithelial lesion on cytologic smear of cervix (LGSIL): Principal | ICD-10-CM

## 2023-07-03 DIAGNOSIS — R3915 Urgency of urination: Principal | ICD-10-CM

## 2023-07-22 ENCOUNTER — Ambulatory Visit: Admit: 2023-07-22 | Discharge: 2023-07-23 | Payer: PRIVATE HEALTH INSURANCE

## 2023-07-22 MED ORDER — PREDNISONE 10 MG TABLET
ORAL_TABLET | ORAL | 0 refills | 12 days | Status: CP
Start: 2023-07-22 — End: 2023-08-02

## 2023-07-22 MED ORDER — VENTOLIN HFA 90 MCG/ACTUATION AEROSOL INHALER
Freq: Four times a day (QID) | RESPIRATORY_TRACT | 5 refills | 0 days | Status: CP | PRN
Start: 2023-07-22 — End: 2024-07-21

## 2023-07-22 MED ORDER — AZITHROMYCIN 500 MG TABLET
ORAL_TABLET | ORAL | 0 refills | 5 days | Status: CP
Start: 2023-07-22 — End: 2023-07-27

## 2023-07-25 DIAGNOSIS — B354 Tinea corporis: Principal | ICD-10-CM

## 2023-07-25 MED ORDER — TERBINAFINE HCL 250 MG TABLET
ORAL_TABLET | Freq: Every day | ORAL | 0 refills | 14 days | Status: CP
Start: 2023-07-25 — End: ?

## 2023-07-30 ENCOUNTER — Ambulatory Visit: Admit: 2023-07-30 | Discharge: 2023-07-31 | Payer: PRIVATE HEALTH INSURANCE

## 2023-08-01 ENCOUNTER — Ambulatory Visit: Admit: 2023-08-01 | Discharge: 2023-08-02 | Payer: PRIVATE HEALTH INSURANCE

## 2023-08-01 MED ORDER — KETOCONAZOLE 2 % TOPICAL CREAM
Freq: Every day | TOPICAL | 0 refills | 60 days | Status: CP
Start: 2023-08-01 — End: ?

## 2023-08-01 MED ORDER — VENTOLIN HFA 90 MCG/ACTUATION AEROSOL INHALER
Freq: Four times a day (QID) | RESPIRATORY_TRACT | 5 refills | 0 days | Status: CP | PRN
Start: 2023-08-01 — End: 2024-07-31

## 2023-08-01 MED ORDER — IBUPROFEN 800 MG TABLET
ORAL | 0 refills | 20 days | Status: CP | PRN
Start: 2023-08-01 — End: 2024-07-31

## 2023-08-10 ENCOUNTER — Ambulatory Visit
Admit: 2023-08-10 | Discharge: 2023-08-11 | Disposition: A | Discharge status: Home / Self Care | Payer: PRIVATE HEALTH INSURANCE

## 2023-08-10 ENCOUNTER — Emergency Department
Admit: 2023-08-10 | Discharge: 2023-08-11 | Disposition: A | Discharge status: Home / Self Care | Payer: PRIVATE HEALTH INSURANCE

## 2023-08-11 MED ORDER — METHOCARBAMOL 500 MG TABLET
ORAL_TABLET | Freq: Two times a day (BID) | ORAL | 0 refills | 10 days | Status: CP
Start: 2023-08-11 — End: 2023-08-21

## 2023-08-11 MED ORDER — VENTOLIN HFA 90 MCG/ACTUATION AEROSOL INHALER
Freq: Four times a day (QID) | RESPIRATORY_TRACT | 5 refills | 0 days | Status: CP | PRN
Start: 2023-08-11 — End: 2024-08-10

## 2023-08-12 ENCOUNTER — Ambulatory Visit: Admit: 2023-08-12 | Payer: PRIVATE HEALTH INSURANCE | Attending: Family Medicine | Primary: Family Medicine

## 2023-08-12 DIAGNOSIS — B354 Tinea corporis: Principal | ICD-10-CM

## 2023-08-12 MED ORDER — KETOCONAZOLE 2 % TOPICAL CREAM
Freq: Every day | TOPICAL | 0 refills | 60 days
Start: 2023-08-12 — End: ?

## 2023-08-13 DIAGNOSIS — B354 Tinea corporis: Principal | ICD-10-CM

## 2023-08-13 MED ORDER — KETOCONAZOLE 2 % TOPICAL CREAM
Freq: Every day | TOPICAL | 0 refills | 60 days | Status: CP
Start: 2023-08-13 — End: ?

## 2023-08-25 ENCOUNTER — Ambulatory Visit: Admit: 2023-08-25 | Discharge: 2023-08-26 | Payer: PRIVATE HEALTH INSURANCE

## 2023-08-25 MED ORDER — OXYCODONE 5 MG TABLET
ORAL_TABLET | ORAL | 0 refills | 4 days | Status: CP | PRN
Start: 2023-08-25 — End: 2023-08-30

## 2023-09-01 DIAGNOSIS — Z7189 Other specified counseling: Principal | ICD-10-CM

## 2023-09-01 DIAGNOSIS — J449 Chronic obstructive pulmonary disease, unspecified: Principal | ICD-10-CM

## 2023-09-01 MED ORDER — ADVAIR DISKUS 250 MCG-50 MCG/DOSE POWDER FOR INHALATION
0 refills | 0 days
Start: 2023-09-01 — End: ?

## 2023-09-02 MED ORDER — ADVAIR DISKUS 250 MCG-50 MCG/DOSE POWDER FOR INHALATION
Freq: Two times a day (BID) | RESPIRATORY_TRACT | 3 refills | 90 days | Status: CP
Start: 2023-09-02 — End: ?

## 2023-09-16 ENCOUNTER — Ambulatory Visit: Admit: 2023-09-16 | Discharge: 2023-09-17 | Payer: PRIVATE HEALTH INSURANCE

## 2023-10-21 ENCOUNTER — Ambulatory Visit: Admit: 2023-10-21 | Discharge: 2023-10-21 | Payer: PRIVATE HEALTH INSURANCE

## 2023-10-21 DIAGNOSIS — L821 Other seborrheic keratosis: Principal | ICD-10-CM

## 2023-10-21 DIAGNOSIS — D229 Melanocytic nevi, unspecified: Principal | ICD-10-CM

## 2023-10-21 DIAGNOSIS — L578 Other skin changes due to chronic exposure to nonionizing radiation: Principal | ICD-10-CM

## 2023-10-21 DIAGNOSIS — L814 Other melanin hyperpigmentation: Principal | ICD-10-CM

## 2023-10-21 DIAGNOSIS — L820 Inflamed seborrheic keratosis: Principal | ICD-10-CM

## 2023-10-21 DIAGNOSIS — D1801 Hemangioma of skin and subcutaneous tissue: Principal | ICD-10-CM

## 2023-10-21 MED ORDER — HYDROXYZINE HCL 25 MG TABLET
ORAL_TABLET | Freq: Four times a day (QID) | ORAL | 11 refills | 30.00 days | Status: CP | PRN
Start: 2023-10-21 — End: 2024-10-20

## 2023-10-21 MED ORDER — PRAMIPEXOLE 0.125 MG TABLET
ORAL_TABLET | Freq: Every evening | ORAL | 3 refills | 90.00 days | Status: CP
Start: 2023-10-21 — End: 2024-10-20

## 2023-10-21 MED ORDER — AZITHROMYCIN 250 MG TABLET
ORAL_TABLET | ORAL | 0 refills | 5.00 days | Status: CP
Start: 2023-10-21 — End: 2023-10-27

## 2023-10-21 MED ORDER — OXYCODONE 5 MG TABLET
ORAL_TABLET | Freq: Two times a day (BID) | ORAL | 0 refills | 30.00 days | Status: CP | PRN
Start: 2023-10-21 — End: 2023-10-21

## 2023-10-21 MED ORDER — PREDNISONE 10 MG TABLET
ORAL_TABLET | ORAL | 0 refills | 12.00 days | Status: CP
Start: 2023-10-21 — End: 2023-11-02

## 2023-10-21 MED ORDER — VENTOLIN HFA 90 MCG/ACTUATION AEROSOL INHALER
Freq: Four times a day (QID) | RESPIRATORY_TRACT | 5 refills | 0.00 days | Status: CP | PRN
Start: 2023-10-21 — End: 2024-10-20

## 2023-10-25 DIAGNOSIS — R000 Tachycardia, unspecified: Secondary | ICD-10-CM | POA: Diagnosis not present

## 2023-10-26 DIAGNOSIS — K7460 Unspecified cirrhosis of liver: Secondary | ICD-10-CM | POA: Diagnosis not present

## 2023-10-26 DIAGNOSIS — J441 Chronic obstructive pulmonary disease with (acute) exacerbation: Secondary | ICD-10-CM | POA: Diagnosis not present

## 2023-10-26 DIAGNOSIS — R7989 Other specified abnormal findings of blood chemistry: Secondary | ICD-10-CM | POA: Diagnosis not present

## 2023-10-26 DIAGNOSIS — I361 Nonrheumatic tricuspid (valve) insufficiency: Secondary | ICD-10-CM | POA: Diagnosis not present

## 2023-10-26 DIAGNOSIS — I340 Nonrheumatic mitral (valve) insufficiency: Secondary | ICD-10-CM | POA: Diagnosis not present

## 2023-10-26 DIAGNOSIS — J9691 Respiratory failure, unspecified with hypoxia: Secondary | ICD-10-CM | POA: Diagnosis not present

## 2023-10-26 MED ORDER — OXYCODONE 5 MG TABLET
ORAL_TABLET | Freq: Two times a day (BID) | ORAL | 0 refills | 30.00 days | Status: CP | PRN
Start: 2023-10-26 — End: 2023-11-25

## 2023-10-27 DIAGNOSIS — J9601 Acute respiratory failure with hypoxia: Secondary | ICD-10-CM | POA: Diagnosis not present

## 2023-10-27 DIAGNOSIS — R7989 Other specified abnormal findings of blood chemistry: Secondary | ICD-10-CM | POA: Diagnosis not present

## 2023-10-27 DIAGNOSIS — E785 Hyperlipidemia, unspecified: Secondary | ICD-10-CM | POA: Diagnosis not present

## 2023-10-28 DIAGNOSIS — J9620 Acute and chronic respiratory failure, unspecified whether with hypoxia or hypercapnia: Secondary | ICD-10-CM | POA: Diagnosis not present

## 2023-10-28 DIAGNOSIS — I21A1 Myocardial infarction type 2: Secondary | ICD-10-CM | POA: Diagnosis not present

## 2023-10-28 DIAGNOSIS — E785 Hyperlipidemia, unspecified: Secondary | ICD-10-CM

## 2023-10-28 DIAGNOSIS — J441 Chronic obstructive pulmonary disease with (acute) exacerbation: Secondary | ICD-10-CM

## 2023-10-28 DIAGNOSIS — R789 Finding of unspecified substance, not normally found in blood: Secondary | ICD-10-CM | POA: Diagnosis not present

## 2023-10-28 DIAGNOSIS — Z720 Tobacco use: Secondary | ICD-10-CM | POA: Diagnosis not present

## 2023-11-11 ENCOUNTER — Encounter: Payer: Self-pay | Admitting: Internal Medicine

## 2023-11-18 ENCOUNTER — Ambulatory Visit: Admit: 2023-11-18 | Discharge: 2023-11-19

## 2023-11-18 ENCOUNTER — Ambulatory Visit: Admit: 2023-11-18 | Discharge: 2023-11-19 | Payer: MEDICARE

## 2023-11-19 MED ORDER — SPIRONOLACTONE 50 MG TABLET
ORAL_TABLET | Freq: Every day | ORAL | 3 refills | 90.00 days | Status: CP
Start: 2023-11-19 — End: 2024-11-18

## 2023-11-19 MED ORDER — FONDAPARINUX 7.5 MG/0.6 ML SUBCUTANEOUS SOLUTION SYRINGE
Freq: Every day | SUBCUTANEOUS | 1 refills | 30 days | Status: CP
Start: 2023-11-19 — End: ?

## 2023-11-19 MED ORDER — MAGNESIUM OXIDE 400 MG (241.3 MG MAGNESIUM) TABLET
ORAL_TABLET | Freq: Every day | ORAL | 1 refills | 200.00 days | Status: CP
Start: 2023-11-19 — End: 2024-11-18

## 2023-11-19 MED ORDER — URSODIOL 300 MG CAPSULE
ORAL_CAPSULE | Freq: Two times a day (BID) | ORAL | 2 refills | 30.00 days | Status: CP
Start: 2023-11-19 — End: ?

## 2023-11-20 ENCOUNTER — Encounter: Admit: 2023-11-20 | Discharge: 2023-11-21 | Payer: MEDICARE

## 2023-11-20 MED ORDER — IPRATROPIUM 0.5 MG-ALBUTEROL 3 MG (2.5 MG BASE)/3 ML NEBULIZATION SOLN
Freq: Four times a day (QID) | RESPIRATORY_TRACT | 3 refills | 30.00 days | Status: CP | PRN
Start: 2023-11-20 — End: 2024-11-19

## 2023-12-05 ENCOUNTER — Ambulatory Visit: Admit: 2023-12-05 | Discharge: 2023-12-05

## 2023-12-11 ENCOUNTER — Ambulatory Visit: Admit: 2023-12-11 | Discharge: 2023-12-12 | Attending: Family | Primary: Family

## 2023-12-11 DIAGNOSIS — R059 Cough, unspecified type: Principal | ICD-10-CM

## 2023-12-11 MED ORDER — BENZONATATE 100 MG CAPSULE
ORAL_CAPSULE | Freq: Three times a day (TID) | ORAL | 0 refills | 15.00 days | Status: CP | PRN
Start: 2023-12-11 — End: 2024-01-10

## 2023-12-12 MED ORDER — OXYCODONE 5 MG TABLET
ORAL_TABLET | Freq: Two times a day (BID) | ORAL | 0 refills | 30.00 days | Status: CP | PRN
Start: 2023-12-12 — End: 2024-01-11

## 2023-12-19 MED ORDER — FONDAPARINUX 7.5 MG/0.6 ML SUBCUTANEOUS SOLUTION SYRINGE
Freq: Every day | SUBCUTANEOUS | 1 refills | 30 days | Status: CP
Start: 2023-12-19 — End: ?

## 2023-12-24 DIAGNOSIS — M8000XG Age-related osteoporosis with current pathological fracture, unspecified site, subsequent encounter for fracture with delayed healing: Principal | ICD-10-CM

## 2023-12-24 DIAGNOSIS — J449 Chronic obstructive pulmonary disease, unspecified: Principal | ICD-10-CM

## 2023-12-26 DIAGNOSIS — J449 Chronic obstructive pulmonary disease, unspecified: Principal | ICD-10-CM

## 2023-12-26 MED ORDER — SPIRIVA RESPIMAT 2.5 MCG/ACTUATION SOLUTION FOR INHALATION
Freq: Every day | RESPIRATORY_TRACT | 1 refills | 0.00 days | Status: CP
Start: 2023-12-26 — End: ?

## 2023-12-26 MED ORDER — APIXABAN 5 MG TABLET
ORAL_TABLET | Freq: Two times a day (BID) | ORAL | 3 refills | 90.00 days | Status: CP
Start: 2023-12-26 — End: 2024-12-25

## 2024-01-02 ENCOUNTER — Encounter: Admit: 2024-01-02 | Discharge: 2024-01-03 | Payer: MEDICARE

## 2024-01-02 DIAGNOSIS — I824Y9 Acute embolism and thrombosis of unspecified deep veins of unspecified proximal lower extremity: Principal | ICD-10-CM

## 2024-01-02 DIAGNOSIS — K743 Primary biliary cirrhosis: Principal | ICD-10-CM

## 2024-01-02 DIAGNOSIS — J189 Pneumonia, unspecified organism: Principal | ICD-10-CM

## 2024-01-02 DIAGNOSIS — R112 Nausea with vomiting, unspecified: Principal | ICD-10-CM

## 2024-01-02 MED ORDER — ONDANSETRON 4 MG DISINTEGRATING TABLET
ORAL_TABLET | Freq: Three times a day (TID) | 3 refills | 20.00 days | Status: CP | PRN
Start: 2024-01-02 — End: 2024-06-30

## 2024-01-11 MED ORDER — OXYCODONE 5 MG TABLET
ORAL_TABLET | Freq: Two times a day (BID) | ORAL | 0 refills | 30.00 days | Status: CP | PRN
Start: 2024-01-11 — End: 2024-02-10

## 2024-01-23 ENCOUNTER — Ambulatory Visit: Admit: 2024-01-23 | Discharge: 2024-01-24 | Payer: MEDICARE

## 2024-01-23 MED ORDER — OXYCODONE 5 MG TABLET
ORAL_TABLET | Freq: Two times a day (BID) | ORAL | 0 refills | 30.00 days | Status: CP | PRN
Start: 2024-01-23 — End: 2024-02-22

## 2024-01-23 MED ORDER — PRAMIPEXOLE 0.125 MG TABLET
ORAL_TABLET | Freq: Every evening | ORAL | 3 refills | 60.00 days | Status: CP | PRN
Start: 2024-01-23 — End: 2025-01-22

## 2024-01-23 MED ORDER — FUROSEMIDE 40 MG TABLET
ORAL_TABLET | Freq: Every day | ORAL | 3 refills | 100.00 days | Status: CP
Start: 2024-01-23 — End: 2025-01-22

## 2024-01-27 MED ORDER — UMECLIDINIUM 62.5 MCG/ACTUATION BLISTER POWDER FOR INHALATION
Freq: Every day | RESPIRATORY_TRACT | 5 refills | 30 days | Status: CP
Start: 2024-01-27 — End: ?

## 2024-01-28 ENCOUNTER — Inpatient Hospital Stay: Admit: 2024-01-28 | Discharge: 2024-01-28 | Payer: MEDICARE

## 2024-02-05 DIAGNOSIS — S22060D Wedge compression fracture of T7-T8 vertebra, subsequent encounter for fracture with routine healing: Principal | ICD-10-CM

## 2024-02-05 DIAGNOSIS — K743 Primary biliary cirrhosis: Principal | ICD-10-CM

## 2024-02-05 MED ORDER — TIOTROPIUM BROMIDE 2.5 MCG/ACTUATION MIST FOR INHALATION
Freq: Every day | RESPIRATORY_TRACT | 5 refills | 0 days | Status: CP
Start: 2024-02-05 — End: ?

## 2024-02-10 MED ORDER — URSODIOL 300 MG CAPSULE
ORAL_CAPSULE | Freq: Two times a day (BID) | ORAL | 0 refills | 0.00 days
Start: 2024-02-10 — End: ?

## 2024-02-12 MED ORDER — URSODIOL 300 MG CAPSULE
ORAL_CAPSULE | Freq: Two times a day (BID) | ORAL | 3 refills | 90.00 days | Status: CP
Start: 2024-02-12 — End: 2025-02-11

## 2024-02-20 MED ORDER — RIFAXIMIN 550 MG TABLET
ORAL_TABLET | Freq: Two times a day (BID) | ORAL | 3 refills | 90.00 days | Status: CP
Start: 2024-02-20 — End: 2025-02-19

## 2024-02-22 MED ORDER — OXYCODONE 5 MG TABLET
ORAL_TABLET | Freq: Two times a day (BID) | ORAL | 0 refills | 30.00 days | Status: CP | PRN
Start: 2024-02-22 — End: 2024-03-23

## 2024-02-23 DIAGNOSIS — K743 Primary biliary cirrhosis: Principal | ICD-10-CM

## 2024-02-23 DIAGNOSIS — S22060D Wedge compression fracture of T7-T8 vertebra, subsequent encounter for fracture with routine healing: Principal | ICD-10-CM

## 2024-02-24 ENCOUNTER — Ambulatory Visit: Admit: 2024-02-24 | Discharge: 2024-02-25 | Payer: Medicare (Managed Care)

## 2024-02-24 DIAGNOSIS — G959 Disease of spinal cord, unspecified: Principal | ICD-10-CM

## 2024-02-24 DIAGNOSIS — S22060D Wedge compression fracture of T7-T8 vertebra, subsequent encounter for fracture with routine healing: Principal | ICD-10-CM

## 2024-02-25 DIAGNOSIS — S22060D Wedge compression fracture of T7-T8 vertebra, subsequent encounter for fracture with routine healing: Principal | ICD-10-CM

## 2024-02-25 DIAGNOSIS — K743 Primary biliary cirrhosis: Principal | ICD-10-CM

## 2024-02-27 DIAGNOSIS — L853 Xerosis cutis: Principal | ICD-10-CM

## 2024-02-27 DIAGNOSIS — R5383 Other fatigue: Principal | ICD-10-CM

## 2024-03-04 ENCOUNTER — Ambulatory Visit: Admit: 2024-03-04 | Discharge: 2024-03-05 | Payer: Medicare (Managed Care)

## 2024-03-04 DIAGNOSIS — N903 Dysplasia of vulva, unspecified: Principal | ICD-10-CM

## 2024-03-04 DIAGNOSIS — R87612 Low grade squamous intraepithelial lesion on cytologic smear of cervix (LGSIL): Principal | ICD-10-CM

## 2024-03-09 ENCOUNTER — Ambulatory Visit: Admit: 2024-03-09 | Discharge: 2024-03-10 | Payer: Medicare (Managed Care)

## 2024-03-09 DIAGNOSIS — Z7289 Other problems related to lifestyle: Principal | ICD-10-CM

## 2024-03-09 DIAGNOSIS — Z5181 Encounter for therapeutic drug level monitoring: Principal | ICD-10-CM

## 2024-03-09 DIAGNOSIS — K743 Primary biliary cirrhosis: Principal | ICD-10-CM

## 2024-03-09 DIAGNOSIS — R5383 Other fatigue: Principal | ICD-10-CM

## 2024-03-09 DIAGNOSIS — L853 Xerosis cutis: Principal | ICD-10-CM

## 2024-03-09 DIAGNOSIS — K7460 Unspecified cirrhosis of liver: Principal | ICD-10-CM

## 2024-03-09 DIAGNOSIS — M048 Other autoinflammatory syndromes: Principal | ICD-10-CM

## 2024-03-09 MED ORDER — URSODIOL 500 MG TABLET
ORAL_TABLET | Freq: Two times a day (BID) | ORAL | 11 refills | 30.00000 days | Status: CP
Start: 2024-03-09 — End: ?

## 2024-03-09 MED ORDER — URSODIOL 300 MG CAPSULE
ORAL_CAPSULE | Freq: Every day | ORAL | 3 refills | 90.00000 days | Status: CN
Start: 2024-03-09 — End: 2025-03-09

## 2024-03-12 DIAGNOSIS — Z5181 Encounter for therapeutic drug level monitoring: Principal | ICD-10-CM

## 2024-03-12 DIAGNOSIS — K743 Primary biliary cirrhosis: Principal | ICD-10-CM

## 2024-03-17 DIAGNOSIS — S22060D Wedge compression fracture of T7-T8 vertebra, subsequent encounter for fracture with routine healing: Principal | ICD-10-CM

## 2024-03-17 DIAGNOSIS — K743 Primary biliary cirrhosis: Principal | ICD-10-CM

## 2024-03-19 ENCOUNTER — Ambulatory Visit: Admit: 2024-03-19 | Discharge: 2024-03-20 | Payer: Medicare (Managed Care)

## 2024-03-19 DIAGNOSIS — M8000XD Age-related osteoporosis with current pathological fracture, unspecified site, subsequent encounter for fracture with routine healing: Principal | ICD-10-CM

## 2024-03-19 DIAGNOSIS — S22060D Wedge compression fracture of T7-T8 vertebra, subsequent encounter for fracture with routine healing: Principal | ICD-10-CM

## 2024-03-19 DIAGNOSIS — M816 Localized osteoporosis [Lequesne]: Principal | ICD-10-CM

## 2024-03-25 ENCOUNTER — Ambulatory Visit: Admit: 2024-03-25 | Discharge: 2024-03-26 | Payer: Medicare (Managed Care)

## 2024-03-26 ENCOUNTER — Ambulatory Visit: Admit: 2024-03-26 | Discharge: 2024-03-26 | Payer: Medicare (Managed Care)

## 2024-03-26 DIAGNOSIS — K743 Primary biliary cirrhosis: Principal | ICD-10-CM

## 2024-03-26 DIAGNOSIS — Z1211 Encounter for screening for malignant neoplasm of colon: Principal | ICD-10-CM

## 2024-03-26 DIAGNOSIS — S22060D Wedge compression fracture of T7-T8 vertebra, subsequent encounter for fracture with routine healing: Principal | ICD-10-CM

## 2024-03-26 DIAGNOSIS — Z1231 Encounter for screening mammogram for malignant neoplasm of breast: Principal | ICD-10-CM

## 2024-03-26 DIAGNOSIS — K219 Gastro-esophageal reflux disease without esophagitis: Principal | ICD-10-CM

## 2024-03-26 MED ORDER — OXYCODONE 5 MG TABLET
ORAL_TABLET | Freq: Two times a day (BID) | ORAL | 0 refills | 30.00000 days | Status: CP | PRN
Start: 2024-03-26 — End: 2024-04-25

## 2024-03-26 MED ORDER — OMEPRAZOLE 20 MG CAPSULE,DELAYED RELEASE
ORAL_CAPSULE | Freq: Every day | ORAL | 3 refills | 90.00000 days | Status: CP
Start: 2024-03-26 — End: 2025-03-26

## 2024-03-27 DIAGNOSIS — K7460 Unspecified cirrhosis of liver: Principal | ICD-10-CM

## 2024-03-27 MED ORDER — SPIRONOLACTONE 100 MG TABLET
ORAL_TABLET | Freq: Every day | ORAL | 3 refills | 90.00000 days | Status: CP
Start: 2024-03-27 — End: 2025-03-27

## 2024-03-29 DIAGNOSIS — J449 Chronic obstructive pulmonary disease, unspecified: Principal | ICD-10-CM

## 2024-03-29 MED ORDER — ADVAIR DISKUS 250 MCG-50 MCG/DOSE POWDER FOR INHALATION
Freq: Two times a day (BID) | RESPIRATORY_TRACT | 3 refills | 90.00000 days | Status: CP
Start: 2024-03-29 — End: ?

## 2024-03-30 DIAGNOSIS — M8000XD Age-related osteoporosis with current pathological fracture, unspecified site, subsequent encounter for fracture with routine healing: Principal | ICD-10-CM

## 2024-03-31 ENCOUNTER — Inpatient Hospital Stay: Admit: 2024-03-31 | Discharge: 2024-04-01 | Payer: Medicare (Managed Care)

## 2024-03-31 ENCOUNTER — Ambulatory Visit: Admit: 2024-03-31 | Discharge: 2024-04-01 | Payer: Medicare (Managed Care)

## 2024-04-06 ENCOUNTER — Ambulatory Visit: Admit: 2024-04-06 | Discharge: 2024-04-07 | Payer: Medicare (Managed Care)

## 2024-04-06 DIAGNOSIS — S22060D Wedge compression fracture of T7-T8 vertebra, subsequent encounter for fracture with routine healing: Principal | ICD-10-CM

## 2024-04-07 ENCOUNTER — Inpatient Hospital Stay: Admit: 2024-04-07 | Discharge: 2024-04-08 | Payer: Medicare (Managed Care)

## 2024-04-08 ENCOUNTER — Ambulatory Visit
Admit: 2024-04-08 | Discharge: 2024-04-09 | Payer: Medicare (Managed Care) | Attending: Hematology & Oncology | Primary: Hematology & Oncology

## 2024-04-08 DIAGNOSIS — E611 Iron deficiency: Principal | ICD-10-CM

## 2024-04-08 DIAGNOSIS — G2581 Restless legs syndrome: Principal | ICD-10-CM

## 2024-04-08 DIAGNOSIS — D649 Anemia, unspecified: Principal | ICD-10-CM

## 2024-04-08 DIAGNOSIS — I825Y2 Chronic embolism and thrombosis of unspecified deep veins of left proximal lower extremity: Principal | ICD-10-CM

## 2024-04-09 DIAGNOSIS — K743 Primary biliary cirrhosis: Principal | ICD-10-CM

## 2024-04-09 DIAGNOSIS — K7460 Unspecified cirrhosis of liver: Principal | ICD-10-CM

## 2024-04-12 DIAGNOSIS — J441 Chronic obstructive pulmonary disease with (acute) exacerbation: Principal | ICD-10-CM

## 2024-04-12 MED ORDER — PREDNISONE 20 MG TABLET
ORAL_TABLET | Freq: Every day | ORAL | 0 refills | 5.00000 days | Status: CP
Start: 2024-04-12 — End: 2024-04-17

## 2024-04-12 MED ORDER — DOXYCYCLINE HYCLATE 100 MG TABLET
ORAL_TABLET | Freq: Two times a day (BID) | ORAL | 0 refills | 7.00000 days | Status: CP
Start: 2024-04-12 — End: 2024-04-19

## 2024-04-21 ENCOUNTER — Inpatient Hospital Stay: Admit: 2024-04-21 | Discharge: 2024-04-22 | Payer: Medicare (Managed Care)

## 2024-04-22 ENCOUNTER — Inpatient Hospital Stay: Admit: 2024-04-22 | Discharge: 2024-04-22 | Payer: Medicare (Managed Care)

## 2024-04-22 ENCOUNTER — Ambulatory Visit: Admit: 2024-04-22 | Discharge: 2024-04-22 | Payer: Medicare (Managed Care)

## 2024-04-22 DIAGNOSIS — J432 Centrilobular emphysema: Principal | ICD-10-CM

## 2024-04-22 MED ORDER — TRELEGY ELLIPTA 200 MCG-62.5 MCG-25 MCG POWDER FOR INHALATION
Freq: Every day | RESPIRATORY_TRACT | 6 refills | 30.00000 days | Status: CP
Start: 2024-04-22 — End: ?

## 2024-04-23 DIAGNOSIS — F331 Major depressive disorder, recurrent, moderate: Principal | ICD-10-CM

## 2024-04-23 DIAGNOSIS — M4850XS Collapsed vertebra, not elsewhere classified, site unspecified, sequela of fracture: Principal | ICD-10-CM

## 2024-04-25 MED ORDER — OXYCODONE 5 MG TABLET
ORAL_TABLET | Freq: Two times a day (BID) | ORAL | 0 refills | 30.00000 days | Status: CP | PRN
Start: 2024-04-25 — End: 2024-05-25

## 2024-05-25 MED ORDER — OXYCODONE 5 MG TABLET
ORAL_TABLET | Freq: Two times a day (BID) | ORAL | 0 refills | 30.00000 days | Status: CP | PRN
Start: 2024-05-25 — End: 2024-06-24

## 2024-05-27 DIAGNOSIS — R7989 Other specified abnormal findings of blood chemistry: Principal | ICD-10-CM

## 2024-05-27 DIAGNOSIS — S22060D Wedge compression fracture of T7-T8 vertebra, subsequent encounter for fracture with routine healing: Principal | ICD-10-CM

## 2024-05-27 DIAGNOSIS — Z86718 Personal history of other venous thrombosis and embolism: Principal | ICD-10-CM

## 2024-05-27 DIAGNOSIS — E876 Hypokalemia: Principal | ICD-10-CM

## 2024-05-27 DIAGNOSIS — M4850XS Collapsed vertebra, not elsewhere classified, site unspecified, sequela of fracture: Principal | ICD-10-CM

## 2024-05-27 DIAGNOSIS — K7460 Unspecified cirrhosis of liver: Principal | ICD-10-CM

## 2024-05-27 DIAGNOSIS — E611 Iron deficiency: Principal | ICD-10-CM

## 2024-05-27 DIAGNOSIS — J449 Chronic obstructive pulmonary disease, unspecified: Principal | ICD-10-CM

## 2024-05-27 MED ORDER — POTASSIUM CHLORIDE ER 20 MEQ TABLET,EXTENDED RELEASE(PART/CRYST)
ORAL_TABLET | Freq: Two times a day (BID) | ORAL | 11 refills | 30.00000 days | Status: CP
Start: 2024-05-27 — End: 2025-05-27

## 2024-05-27 MED ORDER — FLUTICASONE PROPIONATE 50 MCG/ACTUATION NASAL SPRAY,SUSPENSION
Freq: Every day | NASAL | 11 refills | 60.00000 days | Status: CP | PRN
Start: 2024-05-27 — End: 2025-05-27

## 2024-05-27 MED ORDER — LACTULOSE 10 GRAM/15 ML ORAL SOLUTION
Freq: Four times a day (QID) | ORAL | 11 refills | 2.00000 days | Status: CP | PRN
Start: 2024-05-27 — End: 2025-05-27

## 2024-06-01 ENCOUNTER — Encounter: Admit: 2024-06-01 | Discharge: 2024-06-02 | Payer: Medicare (Managed Care)

## 2024-06-01 ENCOUNTER — Ambulatory Visit: Admit: 2024-06-01 | Discharge: 2024-06-02 | Payer: Medicare (Managed Care)

## 2024-06-01 DIAGNOSIS — D492 Neoplasm of unspecified behavior of bone, soft tissue, and skin: Principal | ICD-10-CM

## 2024-06-01 DIAGNOSIS — R21 Rash and other nonspecific skin eruption: Principal | ICD-10-CM

## 2024-06-01 DIAGNOSIS — L821 Other seborrheic keratosis: Principal | ICD-10-CM

## 2024-06-01 DIAGNOSIS — Z1283 Encounter for screening for malignant neoplasm of skin: Principal | ICD-10-CM

## 2024-06-01 MED ORDER — MUPIROCIN 2 % TOPICAL OINTMENT
TOPICAL | 5 refills | 0.00000 days | Status: CP
Start: 2024-06-01 — End: ?

## 2024-06-08 DIAGNOSIS — J321 Chronic frontal sinusitis: Principal | ICD-10-CM

## 2024-06-08 DIAGNOSIS — J449 Chronic obstructive pulmonary disease, unspecified: Principal | ICD-10-CM

## 2024-06-08 MED ORDER — PREDNISONE 20 MG TABLET
ORAL_TABLET | Freq: Every day | ORAL | 0 refills | 5.00000 days | Status: CP
Start: 2024-06-08 — End: 2024-06-13

## 2024-06-08 MED ORDER — DOXYCYCLINE HYCLATE 100 MG TABLET
ORAL_TABLET | Freq: Two times a day (BID) | ORAL | 0 refills | 10.00000 days | Status: CP
Start: 2024-06-08 — End: 2024-06-18

## 2024-06-08 MED ORDER — OXYCODONE 5 MG TABLET
ORAL_TABLET | Freq: Two times a day (BID) | ORAL | 0 refills | 30.00000 days | Status: CP | PRN
Start: 2024-06-08 — End: 2024-07-08

## 2024-06-10 DIAGNOSIS — S22060D Wedge compression fracture of T7-T8 vertebra, subsequent encounter for fracture with routine healing: Principal | ICD-10-CM

## 2024-06-10 DIAGNOSIS — E611 Iron deficiency: Principal | ICD-10-CM

## 2024-06-23 DIAGNOSIS — B3731 Vaginal yeast infection: Principal | ICD-10-CM

## 2024-06-23 MED ORDER — FLUCONAZOLE 100 MG TABLET
ORAL_TABLET | Freq: Every day | ORAL | 0 refills | 3.00000 days | Status: CP
Start: 2024-06-23 — End: 2024-06-26

## 2024-06-25 ENCOUNTER — Ambulatory Visit: Admit: 2024-06-25 | Discharge: 2024-06-25 | Payer: Medicare (Managed Care)

## 2024-06-25 ENCOUNTER — Encounter: Admit: 2024-06-25 | Discharge: 2024-06-25 | Payer: Medicare (Managed Care)

## 2024-06-25 DIAGNOSIS — S22060D Wedge compression fracture of T7-T8 vertebra, subsequent encounter for fracture with routine healing: Principal | ICD-10-CM

## 2024-06-25 DIAGNOSIS — R82994 Hypercalciuria: Principal | ICD-10-CM

## 2024-06-25 DIAGNOSIS — M8000XD Age-related osteoporosis with current pathological fracture, unspecified site, subsequent encounter for fracture with routine healing: Principal | ICD-10-CM

## 2024-06-28 DIAGNOSIS — S22060D Wedge compression fracture of T7-T8 vertebra, subsequent encounter for fracture with routine healing: Principal | ICD-10-CM

## 2024-06-28 DIAGNOSIS — M8000XD Age-related osteoporosis with current pathological fracture, unspecified site, subsequent encounter for fracture with routine healing: Principal | ICD-10-CM

## 2024-06-28 MED ORDER — TYMLOS 80 MCG/DOSE (3,120 MCG/1.56 ML) SUBCUTANEOUS PEN INJECTOR
Freq: Every day | SUBCUTANEOUS | 11 refills | 0.00000 days | Status: CP
Start: 2024-06-28 — End: 2025-06-28

## 2024-06-29 DIAGNOSIS — S22060D Wedge compression fracture of T7-T8 vertebra, subsequent encounter for fracture with routine healing: Principal | ICD-10-CM

## 2024-06-29 DIAGNOSIS — M8000XD Age-related osteoporosis with current pathological fracture, unspecified site, subsequent encounter for fracture with routine healing: Principal | ICD-10-CM

## 2024-07-08 MED ORDER — OXYCODONE 5 MG TABLET
ORAL_TABLET | Freq: Two times a day (BID) | ORAL | 0 refills | 30.00000 days | Status: CP | PRN
Start: 2024-07-08 — End: 2024-08-07

## 2024-07-13 ENCOUNTER — Ambulatory Visit: Admit: 2024-07-13 | Discharge: 2024-07-14 | Payer: Medicare (Managed Care)

## 2024-07-13 DIAGNOSIS — K743 Primary biliary cirrhosis: Principal | ICD-10-CM

## 2024-07-13 DIAGNOSIS — D649 Anemia, unspecified: Principal | ICD-10-CM

## 2024-07-13 DIAGNOSIS — R932 Abnormal findings on diagnostic imaging of liver and biliary tract: Principal | ICD-10-CM

## 2024-07-13 DIAGNOSIS — Z5181 Encounter for therapeutic drug level monitoring: Principal | ICD-10-CM

## 2024-07-15 ENCOUNTER — Encounter: Admit: 2024-07-15 | Discharge: 2024-07-16 | Payer: Medicare (Managed Care)

## 2024-07-15 DIAGNOSIS — J449 Chronic obstructive pulmonary disease, unspecified: Principal | ICD-10-CM

## 2024-07-15 MED ORDER — AZITHROMYCIN 250 MG TABLET
ORAL_TABLET | Freq: Every day | ORAL | 6 refills | 30.00000 days | Status: CP
Start: 2024-07-15 — End: ?

## 2024-07-21 DIAGNOSIS — M8000XD Age-related osteoporosis with current pathological fracture, unspecified site, subsequent encounter for fracture with routine healing: Principal | ICD-10-CM

## 2024-07-21 DIAGNOSIS — S22060D Wedge compression fracture of T7-T8 vertebra, subsequent encounter for fracture with routine healing: Principal | ICD-10-CM

## 2024-07-21 MED ORDER — TERIPARATIDE 20 MCG/DOSE (620 MCG/2.48 ML) SUBCUTANEOUS PEN INJECTOR
Freq: Every day | SUBCUTANEOUS | 11 refills | 30.00000 days | Status: CP
Start: 2024-07-21 — End: 2025-07-21
  Filled 2024-08-04: qty 2.24, 28d supply, fill #0

## 2024-07-23 DIAGNOSIS — S22060D Wedge compression fracture of T7-T8 vertebra, subsequent encounter for fracture with routine healing: Principal | ICD-10-CM

## 2024-07-23 DIAGNOSIS — M8000XD Age-related osteoporosis with current pathological fracture, unspecified site, subsequent encounter for fracture with routine healing: Principal | ICD-10-CM

## 2024-07-26 DIAGNOSIS — S22060D Wedge compression fracture of T7-T8 vertebra, subsequent encounter for fracture with routine healing: Principal | ICD-10-CM

## 2024-07-26 DIAGNOSIS — J449 Chronic obstructive pulmonary disease, unspecified: Principal | ICD-10-CM

## 2024-07-28 MED ORDER — PEG 3350-ELECTROLYTES 236 GRAM-22.74 GRAM-6.74 GRAM-5.86 GRAM SOLUTION
Freq: Once | ORAL | 0 refills | 1.00000 days | Status: CP
Start: 2024-07-28 — End: 2024-07-28

## 2024-07-28 MED ORDER — BISACODYL 5 MG TABLET,DELAYED RELEASE
ORAL_TABLET | ORAL | 0 refills | 0.00000 days | Status: CP
Start: 2024-07-28 — End: ?

## 2024-07-28 MED ORDER — POLYETHYLENE GLYCOL 3350 17 GRAM/DOSE ORAL POWDER
ORAL | 0 refills | 0.00000 days | Status: CP
Start: 2024-07-28 — End: ?

## 2024-07-29 NOTE — Unmapped (Signed)
 [facility] Specialty Medication Onboarding    Specialty Medication: teriparatide  Prior Authorization: Approved   Financial Assistance: No - copay  <$25  Final Copay/Day Supply: $0 / 28    Insurance Restrictions: None     Notes to Pharmacist: None  Credit Card on File: not applicable    The triage team has completed the benefits investigation and has determined that the patient is able to fill this medication at [facility]. Please contact the patient to complete the onboarding or follow up with the prescribing physician as needed.

## 2024-07-30 MED ORDER — PEN NEEDLE, DIABETIC 32 GAUGE X 5/32" (4 MM)
ORAL | 3 refills | 0.00000 days | Status: CP
Start: 2024-07-30 — End: ?
  Filled 2024-08-04: qty 100, 100d supply, fill #0

## 2024-07-30 NOTE — Unmapped (Addendum)
 [facility]    Patient Onboarding/Medication Counseling    Mary Owens is a 63 y.o. female with osteoporosis who I am counseling today on initiation of therapy.  I am speaking to the patient.    Was a Nurse, learning disability used for this call? No    Verified patient's date of birth / HIPAA.    Specialty medication(s) to be sent: General Specialty: teriparatide       Non-specialty medications/supplies to be sent: pen needles, sharps container      Medications not needed at this time: n/a       Forteo  (teriparatide )    Medication & Administration     Dosage:  Osteoporosis: Inject 20mcg under the skin daily    Dosage Form: multi-dose, prefilled pen containing 28 doses of 20mcg    Administration:     Prefilled delivery device  Gather all supplies needed for injection on a clean, flat working surface: medication delivery device removed from packaging, pen needle, alcohol swabs, sharps container, etc.  Look at the medication label - look for correct medication, correct dose, and check the expiration date  Look at the medication - the liquid in the device cartridge should appear clear and colorless  Select injection site - you can use the front of your thigh or your belly (but not the area 2 inches around your belly button)  Prepare injection site - wash your hands and clean the skin at the injection site with an alcohol swab and let it air dry, do not touch the injection site again before the injection  Pull off the white safety cap and make sure you are seated (this medication should not be injected while the patient is standing)  Attach new pen needle; pull off paper tab on pen needle, push the needle straight onto medicine cartridge, screw the needle clockwise until firmly attached, pull off large needle cover and save it  Set the dose; pull out black injection button until it stops, check to make sure the red stripe shows, pull off the small needle protector to expose the pen needle  Inject the dose; gently hold a fold of skin at the prepared injection site with the forefinger and thumb of the hand not holding the device, insert the pen needle straight into the fold of skin, push in the black injection button until it stops, hold in place and count to 5 slowly to ensure the entire dose is injected  Remove the needle from skin; pull the needle out at the same angle as it was inserted, confirm entire dose was given by visually verifying the black injection button is all the way in and none of the yellow shaft is visible, carefully place the large needle cover back over the needle - do not try to put the needle cover back on with your hands  Store the device; unscrew the covered pen needle all the way by giving the large needle cover 3-5 counter-clockwise turns, pull the needle off and discard in a puncture-resistant container, recap the device with the white safety cap and return the device back to the refrigerator  If you see any blood at the injection site, press a cotton ball or gauze on the site and maintain pressure until the bleeding stops, do not rub the injection site      Adherence/Missed dose instructions:  If you forget to give your daily injection take it as soon as you remember on the day it is due.  If you do  not think about the missed dose until the next day, skip the dose altogether and take your next dose as regularly scheduled.  Never take 2 doses together to make up for a missed dose.      Goals of Therapy     Reduce bone loss  Prevent fracture  Increase BMD  Maintenance of function    Side Effects & Monitoring Parameters     Injection site reaction (redness, irritation, inflammation localized to the site of administration)  Upset stomach, diarrhea, or constipation  Joint pain  Minor cold-like symptoms - slight sore throat, runny or stuffy nose, etc.    The following side effects should be reported to the provider:  Signs of a hypersensitivity reaction - rash; hives; itching; red, swollen, blistered, or peeling skin; wheezing; tightness in the chest or throat; difficulty breathing, swallowing, or talking; swelling of the mouth, face, lips, tongue, or throat; etc.  Signs of high or low blood pressure - very bad headache or dizziness, passing out, or changes in eyesight  Signs of high calcium levels - weakness, confusion, fatigue, upset stomach, constipation, bone pain  Signs of a kidney stone - back pain, belly pain, or blood in the urine  Muscle weakness      Warnings, Precautions, & Contraindications     Have your bloodwork checked as you have been told by your prescriber  Black Box Warning: Osteosarcoma risk - avoid use in high risk patients (Paget disease, prior radiation, unexplained elevation of alkaline phosphatase, prior external beam or implant radiation therapy involving the skeleton, patients with open epiphyses, bone metastases, hyperparathyroidism, or preexisting hypercalcemia)  Orthostatic hypotension - usually occurs within 4 hours of dosing and within the first several doses  Use with caution in patients with active or recent urolithiasis because of risk of exacerbation.  Talk with your doctor if you are pregnant, planning to become pregnant, or breastfeeding; this medication should be avoided in those situations  Use of Forteo  for more than 2 years during a patient's lifetime is not recommended    Drug/Food Interactions     Medication list reviewed in Epic. The patient was instructed to inform the care team before taking any new medications or supplements. No drug interactions identified.     Storage, Handling Precautions, & Disposal     Store this medication in the refrigerator.  Do not freeze.  Return the medication to refrigerator immediately after each use  After first use, throw away any part not used after 28 days  Protect from light  Dispose of used pen needles in a puncture-resistant container      Current Medications (including OTC/herbals), Comorbidities and Allergies Current Medications[1]    Allergies[2]    Problem List[3]    Medication list has been reviewed and updated in Epic: Yes    Allergies have been reviewed and updated in Epic: Yes    Appropriateness of Therapy     Acute infections noted within Epic:  No active infections  Patient reported infection: None    Is the medication and dose appropriate considering the patient???s diagnosis, treatment, and disease journey, comorbidities, medical history, current medications, allergies, therapeutic goals, self-administration ability, and access barriers? Yes    Prescription has been clinically reviewed: Yes      Baseline Quality of Life Assessment      How many days over the past month did your osteoporosis  keep you from your normal activities? For example, brushing your teeth or getting up in the morning. 0  Financial Information     Medication Assistance provided: Prior Authorization    Anticipated copay of $0/28 days reviewed with patient. Verified delivery address.    Delivery Information     Scheduled delivery date: 08/05/24    Expected start date: 08/06/24      Medication will be delivered via UPS to the prescription address in [facility].  This shipment will not require a signature.      Explained the services we provide at [facility] and that each month we would call to set up refills.  Stressed importance of returning phone calls so that we could ensure they receive their medications in time each month.  Informed patient that we should be setting up refills 7-10 days prior to when they will run out of medication.  A pharmacist will reach out to perform a clinical assessment periodically.  Informed patient that a welcome packet, containing information about our pharmacy and other support services, a Notice of Privacy Practices, and a drug information handout will be sent.      The patient or caregiver noted above participated in the development of this care plan and knows that they can request review of or adjustments to the care plan at any time.      Patient or caregiver verbalized understanding of the above information as well as how to contact the pharmacy at [phone] option 4 with any questions/concerns.  The pharmacy is open Monday through Friday 8:30am-4:30pm.  A pharmacist is available 24/7 via pager to answer any clinical questions they may have.    Patient Specific Needs     Does the patient have any physical, cognitive, or cultural barriers? No    Does the patient have adequate living arrangements? (i.e. the ability to store and take their medication appropriately) Yes    Did you identify any home environmental safety or security hazards? No    Patient prefers to have medications discussed with  Patient     Is the patient or caregiver able to read and understand education materials at a high school level or above? Yes    Patient's primary language is  English     Is the patient high risk? No    Does the patient have an additional or emergency contact listed in their chart? Yes    SOCIAL DETERMINANTS OF HEALTH     At the [facility], we have learned that life circumstances - like trouble affording food, housing, utilities, or transportation can affect the health of many of our patients.   That is why we wanted to ask: are you currently experiencing any life circumstances that are negatively impacting your health and/or quality of life? Patient declined to answer    Social Drivers of Health     Food Insecurity: No Food Insecurity (03/04/2024)    Hunger Vital Sign     Worried About Running Out of Food in the Last Year: Never true     Ran Out of Food in the Last Year: Never true   Tobacco Use: Medium Risk (07/13/2024)    Patient History     Smoking Tobacco Use: Former     Smokeless Tobacco Use: Never     Passive Exposure: Not on file   Transportation Needs: No Transportation Needs (03/04/2024)    PRAPARE - Therapist, art (Medical): No     Lack of Transportation (Non-Medical): No   Alcohol Use: Patient Declined (04/03/2022)  Received from [facility]    AUDIT-C     Q1: How often do you have a drink containing alcohol?: Patient declined     Q2: How many drinks containing alcohol do you have on a typical day when you are drinking?: Patient declined     Q3: How often do you have six or more drinks on one occasion?: Patient declined   Housing: Low Risk  (03/22/2024)    Housing     Within the past 12 months, have you ever stayed: outside, in a car, in a tent, in an overnight shelter, or temporarily in someone else's home (i.e. couch-surfing)?: No     Are you worried about losing your housing?: No   Physical Activity: Not on file   Utilities: Low Risk  (03/22/2024)    Utilities     Within the past 12 months, have you been unable to get utilities (heat, electricity) when it was really needed?: No   Stress: No Stress Concern Present (04/03/2022)    Received from [facility] - Occupational Stress Questionnaire     Feeling of Stress : Not at all   Interpersonal Safety: Not At Risk (03/09/2024)    Interpersonal Safety     Unsafe Where You Currently Live: No     Physically Hurt by Anyone: No     Abused by Anyone: No   Substance Use: Not on file (09/14/2023)   Intimate Partner Violence: Not At Risk (03/15/2022)    Humiliation, Afraid, Rape, and Kick questionnaire     Fear of Current or Ex-Partner: No     Emotionally Abused: No     Physically Abused: No     Sexually Abused: No   Social Connections: Not on file   Financial Resource Strain: Low Risk  (12/31/2023)    Overall Financial Resource Strain (CARDIA)     Difficulty of Paying Living Expenses: Not hard at all   Recent Concern: Financial Resource Strain - Medium Risk (10/14/2023)    Overall Financial Resource Strain (CARDIA)     Difficulty of Paying Living Expenses: Somewhat hard   Health Literacy: Low Risk  (06/04/2021)    Health Literacy     : Never   Internet Connectivity: Internet connectivity concern unknown (03/22/2024)    Internet Connectivity     Do you have access to internet services: Yes     How do you connect to the internet: Not on file     Is your internet connection strong enough for you to watch video on your device without major problems?: Not on file     Do you have enough data to get through the month?: Not on file     Does at least one of the devices have a camera that you can use for video chat?: Not on file       Would you be willing to receive help with any of the needs that you have identified today? Not applicable       Alm JAYSON Destine, [facility]  [facility] Specialty Pharmacist         [1]   Current Outpatient Medications   Medication Sig Dispense Refill    azithromycin  (ZITHROMAX ) 250 MG tablet Take 1 tablet (250 mg total) by mouth daily. 30 tablet 6    bisacodyl  (DULCOLAX) 5 mg EC tablet Take 2 tablets (10 mg total) by mouth Take as directed. Take 2 tablets (10 mg total)  as directed for bowel prep. 2 tablet 0    doxycycline  (ADOXA) 100 MG tablet Take 1 tablet (100 mg total) by mouth two (2) times a day.      fluticasone  propionate (FLONASE ) 50 mcg/actuation nasal spray 2 sprays into each nostril daily as needed. 16 g 11    fluticasone -umeclidin-vilanter (TRELEGY ELLIPTA ) 200-62.5-25 mcg inhaler Inhale 1 puff daily. 30 each 6    furosemide  (LASIX ) 40 MG tablet Take 1 tablet (40 mg total) by mouth daily. 100 tablet 3    hydrOXYzine  (ATARAX ) 25 MG tablet Take 1 tablet (25 mg total) by mouth every six (6) hours as needed. (can increase bedtime dose to 2 tabs as needed for itching) 120 tablet 11    ipratropium-albuterol  (DUO-NEB) 0.5-2.5 mg/3 mL nebulizer Inhale 3 mL by nebulization every six (6) hours as needed. 360 mL 3    lactulose  (CONSTULOSE ) 10 gram/15 mL solution Take 30 mL (20 g total) by mouth four (4) times a day as needed. TITRATE TO 3 TO 4 BOWEL MOVEMENTS DAILY 240 mL 11    multivitamin-Ca-iron -minerals Tab Take 1 tablet by mouth daily. mupirocin  (BACTROBAN ) 2 % ointment Apply topically twice daily as needed to affected area 15 g 5    omeprazole  (PRILOSEC ) 20 MG capsule Take 1 capsule (20 mg total) by mouth daily. 90 capsule 3    oxyCODONE  (ROXICODONE ) 5 MG immediate release tablet Take 1 tablet (5 mg total) by mouth two (2) times a day as needed for pain. 60 tablet 0    [START ON 08/07/2024] oxyCODONE  (ROXICODONE ) 5 MG immediate release tablet Take 1 tablet (5 mg total) by mouth two (2) times a day as needed for pain. 60 tablet 0    oxyCODONE  (ROXICODONE ) 5 MG immediate release tablet Take 1 tablet (5 mg total) by mouth two (2) times a day as needed for pain. 60 tablet 0    polyethylene glycol (CLEARLAX) 17 gram/dose powder Take as directed for extended bowel prep. 238 g 0    potassium chloride  20 MEQ ER tablet Take 1 tablet (20 mEq total) by mouth two (2) times a day. 60 tablet 11    pramipexole  (MIRAPEX ) 0.125 MG tablet Take 1-2 tablets (0.125-0.25 mg total) by mouth nightly as needed. 120 tablet 3    rifAXIMin  (XIFAXAN ) 550 mg Tab Take 1 tablet (550 mg total) by mouth two (2) times a day. 180 tablet 3    spironolactone  (ALDACTONE ) 100 MG tablet Take 1 tablet (100 mg total) by mouth daily. 90 tablet 3    teriparatide  (FORTEO ) 20 mcg/dose (563mcg/2.24mL) injection Inject 0.08 mL (20 mcg total) under the skin daily. 2.24 mL 11    ursodiol  (URSO  FORTE) 500 MG tablet Take 1 tablet (500 mg total) by mouth two (2) times a day. 60 tablet 11    VENTOLIN  HFA 90 mcg/actuation inhaler Inhale 2 puffs every six (6) hours as needed for wheezing or shortness of breath. 18 g 5    vitamin A-2,400 mcg RAE, 8,000 units, 2,400 mcg RAE (8,000 units) capsule Take 2 capsules (4,800 mcg of RAE total) by mouth daily.       No current facility-administered medications for this visit.   [2]   Allergies  Allergen Reactions    Penicillins Rash     Other reaction(s): RASH  Other reaction(s): RASH    Sulfa (Sulfonamide Antibiotics) Rash     Other reaction(s): RASH  Other reaction(s): RASH   [3]   Patient Active Problem List  Diagnosis  Carcinoma in situ of vulva    Hemorrhoids    Constipation    Lower abdominal pain    Well woman exam (no gynecological exam)    Hx vulvar dysplasia    Neutropenia    Abdominal pain    Ascites    Depression, major, recurrent, moderate (CMS-HCC)    Thrombocytopenia    COPD (chronic obstructive pulmonary disease)    (CMS-HCC)    Macular erythematous rash    Cirrhosis of liver    (CMS-HCC)    Primary biliary cirrhosis    (CMS-HCC)    Restless leg    Osteoporosis with pathological fracture    Nasal congestion    Seborrheic dermatitis    History of Clostridium difficile infection    Left-sided back pain    Hip pain    Psoriasis    COVID    Autoimmune neutropenia    Grief    Iron  deficiency    Senile purpura    Lupus anticoagulant positive    History of nonmelanoma skin cancer    GERD (gastroesophageal reflux disease)    History of DVT (deep vein thrombosis)    Physical deconditioning    Closed wedge compression fracture of T8 vertebra    (CMS-HCC)    Compression fracture of vertebra, non-traumatic    (CMS-HCC)    Hypomagnesemia    Hypokalemia    Low serum vitamin A

## 2024-08-03 MED ORDER — EMPTY CONTAINER
0 refills | 0.00000 days
Start: 2024-08-03 — End: ?

## 2024-08-04 MED FILL — EMPTY CONTAINER: 30 days supply | Qty: 1 | Fill #0

## 2024-08-07 MED ORDER — OXYCODONE 5 MG TABLET
ORAL_TABLET | Freq: Two times a day (BID) | ORAL | 0 refills | 30.00000 days | Status: CP | PRN
Start: 2024-08-07 — End: 2024-09-06

## 2024-08-23 NOTE — Unmapped (Signed)
 [facility] Clinical Assessment & Refill Coordination Note    Mary Owens, [name]: 09-Dec-1960  Phone: There are no phone numbers on file.    All above HIPAA information was verified with patient.     Was a Nurse, learning disability used for this call? No    Specialty Medication(s):   General Specialty: teriparatide      Current Medications[1]     Changes to medications: [name] reports no changes at this time.    Medication list has been reviewed and updated in Epic: Yes    Allergies[2]    Changes to allergies: No    Allergies have been reviewed and updated in Epic: Yes    SPECIALTY MEDICATION ADHERENCE     Teriparatide  20 mcg: 7 days of medicine on hand     Medication Adherence    Patient reported X missed doses in the last month: 0  Specialty Medication: Teriparatide   Patient is on additional specialty medications: No          Specialty medication(s) dose(s) confirmed: Regimen is correct and unchanged.     Are there any concerns with adherence? No    Adherence counseling provided? Not needed    CLINICAL MANAGEMENT AND INTERVENTION      Clinical Benefit Assessment:    Do you feel the medicine is effective or helping your condition? Yes    Clinical Benefit counseling provided? Not needed    Adverse Effects Assessment:    Are you experiencing any side effects? No    Are you experiencing difficulty administering your medicine? No    Quality of Life Assessment:    Quality of Life    Rheumatology  Oncology  Dermatology  Cystic Fibrosis          How many days over the past month did your osteoporosis  keep you from your normal activities? For example, brushing your teeth or getting up in the morning. 0    Have you discussed this with your provider? Not needed    Acute Infection Status:    Acute infections noted within Epic:  No active infections    Patient reported infection: None    Therapy Appropriateness:    Is the medication and dose appropriate considering the patient???s diagnosis, treatment, and disease journey, comorbidities, medical history, current medications, allergies, therapeutic goals, self-administration ability, and access barriers? Yes, therapy is appropriate and should be continued     Clinical Intervention:    Was an intervention completed as part of this clinical assessment? No    DISEASE/MEDICATION-SPECIFIC INFORMATION      For patients on injectable medications: Next injection is scheduled for 08/23/24.    Endocrine: Not Applicable    PATIENT SPECIFIC NEEDS     Does the patient have any physical, cognitive, or cultural barriers? No    Is the patient high risk? No    Does the patient require physician intervention or other additional services (i.e., nutrition, smoking cessation, social work)? No    Does the patient have an additional or emergency contact listed in their chart? Yes    SOCIAL DETERMINANTS OF HEALTH     At the [facility], we have learned that life circumstances - like trouble affording food, housing, utilities, or transportation can affect the health of many of our patients.   That is why we wanted to ask: are you currently experiencing any life circumstances that are negatively impacting your health and/or quality of life? Patient declined to answer  Social Drivers of Health     Food Insecurity: No Food Insecurity (03/04/2024)    Hunger Vital Sign     Worried About Running Out of Food in the Last Year: Never true     Ran Out of Food in the Last Year: Never true   Tobacco Use: Medium Risk (07/13/2024)    Patient History     Smoking Tobacco Use: Former     Smokeless Tobacco Use: Never     Passive Exposure: Not on file   Transportation Needs: No Transportation Needs (03/04/2024)    PRAPARE - Therapist, art (Medical): No     Lack of Transportation (Non-Medical): No   Alcohol Use: Patient Declined (04/03/2022)    Received from [facility]    AUDIT-C     Q1: How often do you have a drink containing alcohol?: Patient declined     Q2: How many drinks containing alcohol do you have on a typical day when you are drinking?: Patient declined     Q3: How often do you have six or more drinks on one occasion?: Patient declined   Housing: Low Risk  (03/22/2024)    Housing     Within the past 12 months, have you ever stayed: outside, in a car, in a tent, in an overnight shelter, or temporarily in someone else's home (i.e. couch-surfing)?: No     Are you worried about losing your housing?: No   Physical Activity: Not on file   Utilities: Low Risk  (03/22/2024)    Utilities     Within the past 12 months, have you been unable to get utilities (heat, electricity) when it was really needed?: No   Stress: No Stress Concern Present (04/03/2022)    Received from [facility] of Occupational Health - Occupational Stress Questionnaire     Feeling of Stress : Not at all   Interpersonal Safety: Not At Risk (03/09/2024)    Interpersonal Safety     Unsafe Where You Currently Live: No     Physically Hurt by Anyone: No     Abused by Anyone: No   Substance Use: Not on file (09/14/2023)   Intimate Partner Violence: Not At Risk (03/15/2022)    Humiliation, Afraid, Rape, and Kick questionnaire     Fear of Current or Ex-Partner: No     Emotionally Abused: No     Physically Abused: No     Sexually Abused: No   Social Connections: Not on file   Financial Resource Strain: Low Risk  (12/31/2023)    Overall Financial Resource Strain (CARDIA)     Difficulty of Paying Living Expenses: Not hard at all   Recent Concern: Financial Resource Strain - Medium Risk (10/14/2023)    Overall Financial Resource Strain (CARDIA)     Difficulty of Paying Living Expenses: Somewhat hard   Health Literacy: Low Risk  (06/04/2021)    Health Literacy     : Never   Internet Connectivity: Internet connectivity concern unknown (03/22/2024)    Internet Connectivity     Do you have access to internet services: Yes     How do you connect to the internet: Not on file     Is your internet connection strong enough for you to watch video on your device without major problems?: Not on file     Do you have enough data to get through the month?: Not on file     Does  at least one of the devices have a camera that you can use for video chat?: Not on file       Would you be willing to receive help with any of the needs that you have identified today? Not applicable       SHIPPING     Specialty Medication(s) to be Shipped:   General Specialty: teriparatide     Other medication(s) to be shipped: No additional medications requested for fill at this time    Specialty Medications not needed at this time: N/A     Changes to insurance: No    Cost and Payment: Patient has a $0 copay, payment information is not required.    Delivery Scheduled: Yes, Expected medication delivery date: 08/26/24.     Medication will be delivered via UPS to the confirmed prescription address in [facility].    The patient will receive a drug information handout for each medication shipped and additional FDA Medication Guides as required.  Verified that patient has previously received a Conservation officer, historic buildings and a Surveyor, mining.    The patient or caregiver noted above participated in the development of this care plan and knows that they can request review of or adjustments to the care plan at any time.      All of the patient's questions and concerns have been addressed.    Alm JAYSON Destine, [facility]   [facility] Specialty Pharmacist       [1]   Current Outpatient Medications   Medication Sig Dispense Refill    azithromycin  (ZITHROMAX ) 250 MG tablet Take 1 tablet (250 mg total) by mouth daily. 30 tablet 6    bisacodyl  (DULCOLAX) 5 mg EC tablet Take 2 tablets (10 mg total) by mouth Take as directed. Take 2 tablets (10 mg total) as directed for bowel prep. 2 tablet 0    doxycycline  (ADOXA) 100 MG tablet Take 1 tablet (100 mg total) by mouth two (2) times a day.      empty container Misc Use as directed with Teriparatide . 1 each 0 fluticasone  propionate (FLONASE ) 50 mcg/actuation nasal spray 2 sprays into each nostril daily as needed. 16 g 11    fluticasone -umeclidin-vilanter (TRELEGY ELLIPTA ) 200-62.5-25 mcg inhaler Inhale 1 puff daily. 30 each 6    furosemide  (LASIX ) 40 MG tablet Take 1 tablet (40 mg total) by mouth daily. 100 tablet 3    hydrOXYzine  (ATARAX ) 25 MG tablet Take 1 tablet (25 mg total) by mouth every six (6) hours as needed. (can increase bedtime dose to 2 tabs as needed for itching) 120 tablet 11    ipratropium-albuterol  (DUO-NEB) 0.5-2.5 mg/3 mL nebulizer Inhale 3 mL by nebulization every six (6) hours as needed. 360 mL 3    lactulose  (CONSTULOSE ) 10 gram/15 mL solution Take 30 mL (20 g total) by mouth four (4) times a day as needed. TITRATE TO 3 TO 4 BOWEL MOVEMENTS DAILY 240 mL 11    multivitamin-Ca-iron -minerals Tab Take 1 tablet by mouth daily.      mupirocin  (BACTROBAN ) 2 % ointment Apply topically twice daily as needed to affected area 15 g 5    omeprazole  (PRILOSEC ) 20 MG capsule Take 1 capsule (20 mg total) by mouth daily. 90 capsule 3    oxyCODONE  (ROXICODONE ) 5 MG immediate release tablet Take 1 tablet (5 mg total) by mouth two (2) times a day as needed for pain. 60 tablet 0    oxyCODONE  (ROXICODONE ) 5 MG immediate release tablet  Take 1 tablet (5 mg total) by mouth two (2) times a day as needed for pain. 60 tablet 0    oxyCODONE  (ROXICODONE ) 5 MG immediate release tablet Take 1 tablet (5 mg total) by mouth two (2) times a day as needed for pain. 60 tablet 0    pen needle, diabetic (BD ULTRA-FINE NANO PEN NEEDLE) 32 gauge x 5/32 (4 mm) Ndle ok to sub any brand or size needle preferred by insurance/patient, use 1x/day for Teriparatide  injections 100 each 3    polyethylene glycol (CLEARLAX) 17 gram/dose powder Take as directed for extended bowel prep. 238 g 0    potassium chloride  20 MEQ ER tablet Take 1 tablet (20 mEq total) by mouth two (2) times a day. 60 tablet 11    pramipexole  (MIRAPEX ) 0.125 MG tablet Take 1-2 tablets (0.125-0.25 mg total) by mouth nightly as needed. 120 tablet 3    rifAXIMin  (XIFAXAN ) 550 mg Tab Take 1 tablet (550 mg total) by mouth two (2) times a day. 180 tablet 3    spironolactone  (ALDACTONE ) 100 MG tablet Take 1 tablet (100 mg total) by mouth daily. 90 tablet 3    teriparatide  (FORTEO ) 20 mcg/dose (548mcg/2.24mL) injection Inject 0.08 mL (20 mcg total) under the skin daily. 2.24 mL 11    ursodiol  (URSO  FORTE) 500 MG tablet Take 1 tablet (500 mg total) by mouth two (2) times a day. 60 tablet 11    VENTOLIN  HFA 90 mcg/actuation inhaler Inhale 2 puffs every six (6) hours as needed for wheezing or shortness of breath. 18 g 5    vitamin A-2,400 mcg RAE, 8,000 units, 2,400 mcg RAE (8,000 units) capsule Take 2 capsules (4,800 mcg of RAE total) by mouth daily.       No current facility-administered medications for this visit.   [2]   Allergies  Allergen Reactions    Penicillins Rash     Other reaction(s): RASH  Other reaction(s): RASH    Sulfa (Sulfonamide Antibiotics) Rash     Other reaction(s): RASH  Other reaction(s): RASH

## 2024-08-25 ENCOUNTER — Inpatient Hospital Stay: Admit: 2024-08-25 | Discharge: 2024-08-25 | Payer: Medicare (Managed Care)

## 2024-08-25 MED FILL — TERIPARATIDE 20 MCG/DOSE (560 MCG/2.24 ML) SUBCUTANEOUS PEN INJECTOR: SUBCUTANEOUS | 28 days supply | Qty: 2.24 | Fill #1

## 2024-08-31 ENCOUNTER — Ambulatory Visit: Admit: 2024-08-31 | Discharge: 2024-09-01 | Payer: Medicare (Managed Care)

## 2024-08-31 DIAGNOSIS — J449 Chronic obstructive pulmonary disease, unspecified: Principal | ICD-10-CM

## 2024-08-31 DIAGNOSIS — E611 Iron deficiency: Principal | ICD-10-CM

## 2024-08-31 DIAGNOSIS — E509 Vitamin A deficiency, unspecified: Principal | ICD-10-CM

## 2024-08-31 DIAGNOSIS — E876 Hypokalemia: Principal | ICD-10-CM

## 2024-08-31 LAB — BASIC METABOLIC PANEL
ANION GAP: 11 mmol/L (ref 5–14)
BLOOD UREA NITROGEN: 10 mg/dL (ref 9–23)
BUN / CREAT RATIO: 16
CALCIUM: 9.4 mg/dL (ref 8.7–10.4)
CHLORIDE: 104 mmol/L (ref 98–107)
CO2: 28 mmol/L (ref 20.0–31.0)
CREATININE: 0.64 mg/dL (ref 0.55–1.02)
EGFR CKD-EPI (2021) FEMALE: >90 mL/min/1.73m2 (ref >=60)
GLUCOSE RANDOM: 93 mg/dL (ref 70–179)
POTASSIUM: 3.5 mmol/L (ref 3.5–5.1)
SODIUM: 143 mmol/L (ref 135–145)

## 2024-08-31 LAB — CBC W/ AUTO DIFF
BASOPHILS ABSOLUTE COUNT: 0 10*9/L (ref 0.0–0.1)
BASOPHILS RELATIVE PERCENT: 0.5 %
EOSINOPHILS ABSOLUTE COUNT: 0 10*9/L (ref 0.0–0.5)
EOSINOPHILS RELATIVE PERCENT: 0 %
HEMATOCRIT: 42.6 % (ref 34.0–44.0)
HEMOGLOBIN: 14.6 g/dL (ref 11.3–14.9)
LYMPHOCYTES ABSOLUTE COUNT: 0.5 10*9/L — ABNORMAL LOW (ref 1.1–3.6)
LYMPHOCYTES RELATIVE PERCENT: 34.1 %
MEAN CORPUSCULAR HEMOGLOBIN CONC: 34.2 g/dL (ref 32.0–36.0)
MEAN CORPUSCULAR HEMOGLOBIN: 30.3 pg (ref 25.9–32.4)
MEAN CORPUSCULAR VOLUME: 88.6 fL (ref 77.6–95.7)
MEAN PLATELET VOLUME: 7.9 fL (ref 6.8–10.7)
MONOCYTES ABSOLUTE COUNT: 0.3 10*9/L (ref 0.3–0.8)
MONOCYTES RELATIVE PERCENT: 20.8 %
NEUTROPHILS ABSOLUTE COUNT: 0.6 10*9/L — ABNORMAL LOW (ref 1.8–7.8)
NEUTROPHILS RELATIVE PERCENT: 44.6 %
PLATELET COUNT: 74 10*9/L — ABNORMAL LOW (ref 150–450)
RED BLOOD CELL COUNT: 4.81 10*12/L (ref 3.95–5.13)
RED CELL DISTRIBUTION WIDTH: 19.3 % — ABNORMAL HIGH (ref 12.2–15.2)
WBC ADJUSTED: 1.3 10*9/L — ABNORMAL LOW (ref 3.6–11.2)

## 2024-08-31 LAB — IRON PANEL
IRON SATURATION: 30 % (ref 20–55)
IRON: 94 ug/dL (ref 50–170)
TOTAL IRON BINDING CAPACITY: 312 ug/dL (ref 250–425)

## 2024-08-31 LAB — MAGNESIUM: MAGNESIUM: 1.7 mg/dL (ref 1.6–2.6)

## 2024-08-31 LAB — FERRITIN: FERRITIN: 28.3 ng/mL (ref 7.3–270.7)

## 2024-08-31 MED ORDER — HYDROXYZINE HCL 25 MG TABLET
ORAL_TABLET | Freq: Four times a day (QID) | ORAL | 11 refills | 30.00000 days | Status: CP | PRN
Start: 2024-08-31 — End: 2025-08-31

## 2024-08-31 NOTE — Progress Notes (Signed)
 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   - Continue ursodiol  as prescribed by GI.  - Continue rifaximin  and lactulose  to manage hepatic encephalopathy.  - has upcoming endoscopies  - Refer to [facility] dental program for evaluation due to low white blood cell counts and low platelet count and need for dental work.  - refilled hydroxyzine  which she uses for itching    Osteoporosis with current pathological vertebral fracture and chronic thoracic back pain  Chronic thoracic back pain with T8 compression fracture. Reports worsening pain, possibly indicating a new fracture. Had a fall about 6 weeks ago with sacral pain at the time which has resolved. Pain persists despite current management. CT scan recommended for further evaluation of the new higher thoracic pain. Adjusted pain management plan for dosing flexibility.  - Order CT scan of thoracic spine at [facility] to evaluate for new compression fracture.  - Increase oxycodone  prescription to allow for up to three doses per day, with an additional 10 pills per month for flexibility in dosing. #70 per month  - Continue daily osteoporosis treatment injections managed by endocrine, appreciate assistance    Chronic obstructive pulmonary disease (COPD)  COPD symptoms improved with switch to Trelegy. Reports less dyspnea.  - Continue Trelegy as current management for COPD.  - EKG obtained today with normal QTc per request of her pulmonologist    Iron  deficiency  Iron  deficiency with recent iron  infusion in August. Reports pica and feeling cold, indicating potential ongoing deficiency.  - Order iron  studies and complete blood count to assess current iron  status.    Hypokalemia and hypomagnesemia  Hypokalemia managed with potassium chloride . She would like to be able to come off this medication  - Order serum potassium and magnesium  levels.  - Continue potassium chloride  20 MEQ oral daily.    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  - Order serum vitamin A level.  - remains on supplementation.    Leg pain  She is having some bony leg pain, on brief review I don't see any lab abnormalities to expalin this symptom.    Assessment & Plan  Closed wedge compression fracture of T8 vertebra with routine healing, subsequent encounter  Patient with compression fracture 08/2023 due to osteoporosis. Has been extremely painful and pain treatment options are limited.   -per report from my colleague calcitonin was denied. she had GI bleed with NSAIDs. not able to use tylenol  with her cirrhosis  -lidocaine  patch, gabapentin , robaxin  not effective.   -is sp consultation at [facility] pain clinic. They are not taking over opiate pain management  -saw spine surgeon at [facility], but due to transportation limitations, would like to transition all of her outpatient care back to [facility]. [facility] had discussed possible kyphoplasty with her sp endocrine treatment. Sp [place] spine visit, not recommending surgery  -Verbal consent completed 12/05/23. Reviewed basics of our controlled substance agreement verbally. (Unfortunately I did not have her sign this at our in person visit today)  -oxycodone  5mg  TID prn #70 11/5, 12/5,1/4      Complete Pain Management Assessment        - Periodic review to reassess this plan has been done  - Current  Daily Morphine equivalents: : 15-19 MME/Day  - CDC guidelines for prescribing opioids for chronic pain have been followed     Previously tried and failed  Short acting Opiates: none tried     Previously tried and failed Longing acting Opiates: none tried     - I verify informed consent for this treatment plan.  -  Pain Agreement Status: Not on file  - I verify that I checked the [place] Controlled Substance      Orders:    oxyCODONE  (ROXICODONE ) 5 MG immediate release tablet; Take 1 tablet (5 mg total) by mouth every eight (8) hours as needed for pain.    oxyCODONE  (ROXICODONE ) 5 MG immediate release tablet; Take 1 tablet (5 mg total) by mouth every eight (8) hours as needed for pain.    oxyCODONE  (ROXICODONE ) 5 MG immediate release tablet; Take 1 tablet (5 mg total) by mouth every eight (8) hours as needed for pain.      HEALTH MAINTENANCE ITEMS STILL DUE:  Health Maintenance Due   Topic Date Due    Zoster Vaccines (1 of 2) Never done    COVID-19 Vaccine (1 - 2025-26 season) Never done    Influenza Vaccine (1) 07/05/2024         Future Appointments   Date Time Provider Department Center   09/10/2024  3:00 PM CHT CT RM 1 ICTCHAT [place]   10/04/2024  1:00 PM [place] [facility]  RM 1 IMGUSET [place] - [facility]   10/21/2024  9:30 AM Charlott Rosina Knudsen, MD [facility]ULSP[facility]T [facility]   11/08/2024  3:20 PM Minerva Rosina Jansky, MD [facility]   11/12/2024  2:50 PM Garnett Athena Sheree Shelly, MD UNCDIABENDET [facility] [facility]   12/07/2024  1:30 PM Alpha Chloe SAUNDERS, MD [facility]   01/13/2025  3:15 PM Bae-Jump, Richerd Farr, MD [facility]   04/14/2025 10:30 AM Key, Malachi Pae, MD UNCBENHEMET [place] [facility]           Chief Complaint   Patient presents with    Follow-up     Check iron  and potassium  Legs hurt       SUBJECTIVE:  History of Present Illness  Mary Owens is a 63 year old female with osteoporosis and chronic back pain who presents for evaluation of worsening back pain and medication management.    She describes her back pain as constant and unrelieved by current pain medication. A recent fall about a month ago may have exacerbated her symptoms. She uses a donut pillow for relief and notes that driving long distances worsens her pain. She is currently taking pain medication twice daily, occasionally three times when the pain is severe.    She is on a daily injection for osteoporosis, which she will continue for two years. She has a history of a T8 compression fracture and reports new pain above this area, describing it as being all over her back but worse in the upper region.    She has been experiencing sore bones in her legs for about two years, with pain upon touch, such as during a pedicure or when applying lotion.    She reports improved lung function since switching from Advair  and Spiriva  to Trelegy, with less shortness of breath.    She is scheduled for a colonoscopy and endoscopy in December due to concerns about potential blood loss. She had an iron  infusion in August following a hospital stay and requests her iron  and potassium levels be checked. She craves salt and ice, which she attributes to anemia, and feels cold frequently.    She is taking azithromycin  once daily and requests a check on her  EKG due to this medication. She also takes furosemide , spironolactone , rifaximin , ursodiol , lactulose , hydroxyzine , and potassium supplements. She takes hydroxyzine  for itching, one in the morning and two at night.    She has dental issues, unable to wear her bottom dentures and desires implants. She has a history of low white blood cell and platelet counts, which may qualify her for specialized dental care.                   Review of Systems as above    Mary Owens  reports that she quit smoking about 14 months ago. Her smoking use included cigarettes. She started smoking about 37 years ago. She has a 18.3 pack-year smoking history. She has never used smokeless tobacco.    OBJECTIVE:    Mary Owens  height is 165.1 cm (5' 5) and weight is 66.4 kg (146 lb 6.4 oz). Her temporal temperature is 36.1 ??C (97 ??F). Her blood pressure is 108/70 and her pulse is 85.   Wt Readings from Last 3 Encounters:   08/31/24 66.4 kg (146 lb 6.4 oz)   07/15/24 68 kg (150 lb)   07/13/24 68.2 kg (150 lb 6.4 oz)      PHQ-9 PHQ-9 Total Score   08/31/2024   1:30 PM 3   07/22/2023  10:15 AM 0    10/24/2021  11:00 AM 8    09/21/2021  10:00 AM 14    01/26/2020   9:00 AM 5    12/01/2019  11:00 AM 15    10/21/2019  11:00 AM 18        Data saved with a previous flowsheet row definition       PHQ-2 Score:      PHQ-9 PHQ-9 Total Score   08/31/2024   1:30 PM 3   07/22/2023  10:15 AM 0    10/24/2021  11:00 AM 8    09/21/2021  10:00 AM 14    01/26/2020   9:00 AM 5        Data saved with a previous flowsheet row definition      GAD-7 Score:       Physical Exam          Results

## 2024-09-01 NOTE — Assessment & Plan Note (Signed)
 Orders:    Basic Metabolic Panel; Future    Magnesium  Level; Future

## 2024-09-01 NOTE — Assessment & Plan Note (Signed)
 Orders:    Ambulatory referral to Dentistry; Future

## 2024-09-01 NOTE — Telephone Encounter (Signed)
 I spoke with patient Mary Owens to confirm appointments on the following date(s): 01/13/25 at 3:15pm with Dr Lucius Nail.    Delon Nipper

## 2024-09-01 NOTE — Assessment & Plan Note (Signed)
 Orders:    ECG 12 Lead

## 2024-09-01 NOTE — Assessment & Plan Note (Addendum)
 Patient with compression fracture 08/2023 due to osteoporosis. Has been extremely painful and pain treatment options are limited.   -per report from my colleague calcitonin was denied. she had GI bleed with NSAIDs. not able to use tylenol  with her cirrhosis  -lidocaine  patch, gabapentin , robaxin  not effective.   -is sp consultation at [facility] pain clinic. They are not taking over opiate pain management  -saw [facility] surgeon at [facility], but due to transportation limitations, would like to transition all of her outpatient care back to [facility]. [facility] had discussed possible kyphoplasty with her sp endocrine treatment. Sp [place] spine visit, not recommending surgery  -Verbal consent completed 12/05/23. Reviewed basics of our controlled substance agreement verbally. (Unfortunately I did not have her sign this at our in person visit today)  -oxycodone  5mg  TID prn #70 11/5, 12/5,1/4      Complete Pain Management Assessment        - Periodic review to reassess this plan has been done  - Current  Daily Morphine equivalents: : 15-19 MME/Day  - CDC guidelines for prescribing opioids for chronic pain have been followed     Previously tried and failed Short acting Opiates: none tried     Previously tried and failed Longing acting Opiates: none tried     - I verify informed consent for this treatment plan.  -  Pain Agreement Status: Not on file  - I verify that I checked the [place] Controlled Substance      Orders:    oxyCODONE  (ROXICODONE ) 5 MG immediate release tablet; Take 1 tablet (5 mg total) by mouth every eight (8) hours as needed for pain.    oxyCODONE  (ROXICODONE ) 5 MG immediate release tablet; Take 1 tablet (5 mg total) by mouth every eight (8) hours as needed for pain.    oxyCODONE  (ROXICODONE ) 5 MG immediate release tablet; Take 1 tablet (5 mg total) by mouth every eight (8) hours as needed for pain.

## 2024-09-01 NOTE — Assessment & Plan Note (Signed)
 Orders:    Ferritin; Future    Iron  Panel; Future    CBC w/ Differential; Future

## 2024-09-02 DIAGNOSIS — G2581 Restless legs syndrome: Principal | ICD-10-CM

## 2024-09-02 DIAGNOSIS — E611 Iron deficiency: Principal | ICD-10-CM

## 2024-09-02 NOTE — Telephone Encounter (Signed)
Reviewed. Action taken in a different encounter.

## 2024-09-06 DIAGNOSIS — S22060D Wedge compression fracture of T7-T8 vertebra, subsequent encounter for fracture with routine healing: Principal | ICD-10-CM

## 2024-09-06 DIAGNOSIS — K7460 Unspecified cirrhosis of liver: Principal | ICD-10-CM

## 2024-09-06 LAB — VITAMIN A: VITAMIN A RESULT: 16.9 ug/dL — ABNORMAL LOW

## 2024-09-06 NOTE — Progress Notes (Signed)
 [facility] FAMILY MEDICINE [place] POPULATION HEALTH  Care Management Progress Note    Date: 09/06/2024  Outcome:  Phone outreach completed    Is this patient in [place] ? Yes    Purpose of contact:       CM contacted patient for routine CCM check in. Patient reports having back pain and rated it a 9 out 10 on pain scale. Plans to take medication later today to help relieve it.     Patient reports having pain in the bones of her lower legs upon touch or rubbing. States that she has been experiencing this for about two years. She mentioned this to PCP at her 10/28 but states that PCP did not do anything about it.   - Patient declines making a follow up appointment with PCP to discuss this. States she will wait until her video visit that is scheduled for January.     Patient did not have any social needs at this time.     Barriers to Care:  N/A    Provider/Care Partner(s) to follow up on:   N/A    Health Maintenance:  Health Maintenance Due   Topic Date Due    Zoster Vaccines (1 of 2) Never done    COVID-19 Vaccine (1 - 2025-26 season) Never done    Influenza Vaccine (1) 07/05/2024     Additional Information/Plan:  Patient provided my direct contact information and encouraged to contact me should additional needs arise.    Time Spent Per Day:  Chart review was completed prior to outreach attempt.   09/06/2024: 10    HERMENIA CIRA Barman Fellow  [facility] Health Care Manager  [facility] FAMILY MEDICINE [place]

## 2024-09-08 MED ORDER — OXYCODONE 5 MG TABLET
ORAL_TABLET | Freq: Three times a day (TID) | ORAL | 0 refills | 24.00000 days | Status: CP | PRN
Start: 2024-09-08 — End: 2024-10-08

## 2024-09-09 NOTE — Addendum Note (Signed)
 Addended by: MINERVA ROSINA HERO on: 09/09/2024 10:12 AM     Modules accepted: Orders

## 2024-09-09 NOTE — Telephone Encounter (Signed)
Reviewed. Action taken in a different encounter.

## 2024-09-10 ENCOUNTER — Inpatient Hospital Stay: Admit: 2024-09-10 | Discharge: 2024-09-10 | Payer: Medicare (Managed Care)

## 2024-09-13 DIAGNOSIS — K7460 Unspecified cirrhosis of liver: Principal | ICD-10-CM

## 2024-09-13 DIAGNOSIS — S22060D Wedge compression fracture of T7-T8 vertebra, subsequent encounter for fracture with routine healing: Principal | ICD-10-CM

## 2024-09-13 NOTE — Progress Notes (Signed)
 [facility] FAMILY MEDICINE [place] POPULATION HEALTH  Care Management Progress Note    Date: 09/13/2024  Outcome:  Phone outreach completed    Is this patient in [place] ? Yes    Purpose of contact:           Patient contacted CM requesting an explanation of what her CT Thoracic Spine Wo Contrast results mean. CM shared the impression notes but that is also what the patient's read on her own and doesn't understand what they mean. She would like a detailed explanation of the results from PCP. CM routed this request to PCP.  Will follow up with patient once PCP responds.     Barriers to Care:  N/A    Provider/Care Partner(s) to follow up on:   Explanation of recent CT scan results    Additional Information/Plan:  Patient provided my direct contact information and encouraged to contact me should additional needs arise.    Time Spent Per Day:  Chart review was completed prior to outreach attempt.   09/13/2024: 5    Abu Heavin, [employer] Fellow  [employer] Health Care Manager  [facility] FAMILY MEDICINE [place]

## 2024-09-17 NOTE — Progress Notes (Signed)
 [facility] Specialty and Home Delivery Pharmacy Refill Coordination Note    Specialty Medication(s) to be Shipped:   General Specialty: teriparatide     Other medication(s) to be shipped: No additional medications requested for fill at this time    Specialty Medications not needed at this time: N/A     Mary Owens, DOB: 1961/01/05  Phone: [phone] (home)       All above HIPAA information was verified with patient.     Was a nurse, learning disability used for this call? No    Completed refill call assessment today to schedule patient's medication shipment from the [facility] Delivery Pharm[facility]  [phone]).  All relevant notes have been reviewed.     Specialty medication(s) and dose(s) confirmed: Regimen is correct and unchanged.   Changes to medications: Sheriden reports no changes at this time.  Changes to insurance: No  New side effects reported not previously addressed with a pharmacist or physician: None reported  Questions for the pharmacist: No    Confirmed patient received a Conservation Officer, Historic Buildings and a Surveyor, Mining with first shipment. The patient will receive a drug information handout for each medication shipped and additional FDA Medication Guides as required.       DISEASE/MEDICATION-SPECIFIC INFORMATION        For patients on injectable medications: Next injection is scheduled for 09/17/24.    SPECIALTY MEDICATION ADHERENCE     Medication Adherence    Patient reported X missed doses in the last month: 0  Specialty Medication: teriparatide  20 mcg/dose (535mcg/2.24mL) injection (FORTEO )  Patient is on additional specialty medications: No  Patient is on more than two specialty medications: No  Any gaps in refill history greater than 2 weeks in the last 3 months: no  Demonstrates understanding of importance of adherence: yes              Were doses missed due to medication being on hold? No    teriparatide  20   mg/ml: 9 days of medicine on hand       REFERRAL TO PHARMACIST     Referral to the pharmacist: Not needed      [facility]     Shipping address confirmed in Epic.     Cost and Payment: Patient has a $0 copay, payment information is not required.    Delivery Scheduled: Yes, Expected medication delivery date: 09/23/24.     Medication will be delivered via UPS to the prescription address in Epic WAM.    Kyra Myron   [facility] Special[facility] and Home Delivery Pharmacy  Specialty Technician

## 2024-09-20 DIAGNOSIS — S22060D Wedge compression fracture of T7-T8 vertebra, subsequent encounter for fracture with routine healing: Principal | ICD-10-CM

## 2024-09-20 DIAGNOSIS — K7460 Unspecified cirrhosis of liver: Principal | ICD-10-CM

## 2024-09-20 NOTE — Progress Notes (Signed)
 [facility] FAMILY MEDICINE [place] POPULATION HEALTH  Care Management Progress Note    Date: 09/20/2024  Outcome:  Phone outreach completed    Is this patient in [place] ? Yes    Purpose of contact:           Patient contacted CM expressing concern that PCP has not yet provided clarification regarding her CT scan results. CM reassured patient that the PCP has not responded to the initial request sent on 11/10. CM informed patient that a follow-up message will be sent to the PCP with a high-priority flag to expedite response.    Provider/Care Partner(s) to follow up on:   Explanation of recent CT scan results     Additional Information/Plan:  Patient provided my direct contact information and encouraged to contact me should additional needs arise.    Time Spent Per Day:  Chart review was completed prior to outreach attempt.   09/20/2024: 5    Pellegrino Kennard, [employer] Fellow  [employer] Health Care Manager  [facility] FAMILY MEDICINE [place]

## 2024-09-22 MED FILL — TERIPARATIDE 20 MCG/DOSE (560 MCG/2.24 ML) SUBCUTANEOUS PEN INJECTOR: SUBCUTANEOUS | 28 days supply | Qty: 2.24 | Fill #2

## 2024-09-24 NOTE — Progress Notes (Signed)
 Pt would like for you to contact her back concerning results from CT scan done 10/28

## 2024-09-27 NOTE — Telephone Encounter (Signed)
Reviewed. Action taken in a different encounter.

## 2024-10-04 ENCOUNTER — Inpatient Hospital Stay: Admit: 2024-10-04 | Discharge: 2024-10-04 | Payer: Medicare (Managed Care)

## 2024-10-04 NOTE — Telephone Encounter (Signed)
 Spoke with Mrs. Mirkin regarding upcoming GI procedure on 10/07/24 at [facility] location at 1015 with an arrival time of 0915.     Informed patient prep instructions were sent via MyChart on 07/28/24.   Patient had not yet read instructions, reviewed pertinent information and advised to read instructions as soon as able. Reviewed diet and prep process and provided number for future questions if they arise.    Verified prep medications ordered/received.    Reviewed information regarding holding furosemide  and spironolactone  day of procedure (until after procedure), as well as holding vitamins/supplements/oil based items/iron  containing products the day before and day of procedure. All other approved medications should be taken at least two hours prior to appointment time.      Reviewed low fiber diet should have started six days prior to procedure.    Reviewed LIQUID diet requirement two entire days prior to procedure as well as NOTHING AT ALL 2 hours prior to procedure    Reviewed driver requirement (18 or older, must be able to drive patient home, NO UBER OR LYFT FOR PICK-UP, MUST SIGN DISCHARGE PAPERS, must remain within 20 minutes of facility and reachable by cell phone).      No further questions at this time.

## 2024-10-06 DIAGNOSIS — K7460 Unspecified cirrhosis of liver: Principal | ICD-10-CM

## 2024-10-07 ENCOUNTER — Encounter: Admit: 2024-10-07 | Discharge: 2024-10-07 | Payer: Medicare (Managed Care)

## 2024-10-07 ENCOUNTER — Inpatient Hospital Stay: Admit: 2024-10-07 | Discharge: 2024-10-07 | Payer: Medicare (Managed Care)

## 2024-10-07 MED ADMIN — propofol (DIPRIVAN) infusion 10 mg/mL: INTRAVENOUS | @ 15:00:00 | Stop: 2024-10-07

## 2024-10-07 MED ADMIN — lidocaine 4% (XYLOCAINE) mucosal solution: OROMUCOSAL | @ 16:00:00 | Stop: 2024-10-07

## 2024-10-07 MED ADMIN — ondansetron (ZOFRAN) injection: INTRAVENOUS | @ 15:00:00 | Stop: 2024-10-07

## 2024-10-07 MED ADMIN — lidocaine (PF) (XYLOCAINE-MPF) 20 mg/mL (2 %) injection: INTRAVENOUS | @ 15:00:00 | Stop: 2024-10-07

## 2024-10-07 MED ADMIN — lactated Ringers infusion: 10 mL/h | INTRAVENOUS | @ 15:00:00 | Stop: 2024-10-07

## 2024-10-07 MED ADMIN — Propofol (DIPRIVAN) injection: INTRAVENOUS | @ 15:00:00 | Stop: 2024-10-07

## 2024-10-07 MED ADMIN — Propofol (DIPRIVAN) injection: INTRAVENOUS | @ 16:00:00 | Stop: 2024-10-07

## 2024-10-08 MED ORDER — OXYCODONE 5 MG TABLET
ORAL_TABLET | Freq: Three times a day (TID) | ORAL | 0 refills | 24.00000 days | Status: CP | PRN
Start: 2024-10-08 — End: 2024-11-07

## 2024-10-11 ENCOUNTER — Encounter: Admit: 2024-10-11 | Discharge: 2024-10-11 | Payer: Medicare (Managed Care)

## 2024-10-11 DIAGNOSIS — E611 Iron deficiency: Principal | ICD-10-CM

## 2024-10-11 DIAGNOSIS — G2581 Restless legs syndrome: Principal | ICD-10-CM

## 2024-10-11 MED ADMIN — diphenhydrAMINE (BENADRYL) 25 mg capsule/tablet: ORAL | @ 17:00:00 | Stop: 2024-10-11

## 2024-10-11 MED ADMIN — iron dextran (INFED) 1,000 mg in sodium chloride (NS) 0.9 % 250 mL IVPB: 1000 mg | INTRAVENOUS | @ 18:00:00 | Stop: 2024-10-11

## 2024-10-11 MED ADMIN — diphenhydrAMINE (BENADRYL) capsule/tablet 50 mg: 50 mg | ORAL | @ 17:00:00 | Stop: 2024-10-11

## 2024-10-11 MED ADMIN — iron dextran (INFED) 25 mg in sodium chloride (NS) 0.9 % 25 mL IVPB: 25 mg | INTRAVENOUS | @ 17:00:00 | Stop: 2024-10-11

## 2024-10-11 NOTE — Progress Notes (Signed)
 1135 Pt arrived for Infed . Premedicated with Tylenol  650 mg and Benadryl  25 mg po. PIV inserted.     1145 Premeds wait 30 minutes.     1217 Test dose infusing over 15 minutes.     1230 Test dose complete.     1250 Pt with no reaction to test dose 15 minutes post infusion. Infed  1 gm infusing.     1353 Infusion completed. PIV flushed. Assess for 30 minutes post infusion. No reactions. PIV removed. Dc'd from Infusion Center.

## 2024-10-11 NOTE — Progress Notes (Signed)
 [facility] Specialty and Home Delivery Pharmacy Refill Coordination Note    Specialty Medication(s) to be Shipped:   General Specialty: teriparatide     Other medication(s) to be shipped: No additional medications requested for fill at this time    Specialty Medications not needed at this time: N/A     Mary Owens, DOB: 1961/07/20  Phone: [phone] (home)       All above HIPAA information was verified with patient.     Was a nurse, learning disability used for this call? No    Completed refill call assessment today to schedule patient's medication shipment from the [facility] and Home Delivery Pharmacy  [phone]).  All relevant notes have been reviewed.     Specialty medication(s) and dose(s) confirmed: Regimen is correct and unchanged.   Changes to medications: Joya reports no changes at this time.  Changes to insurance: No  New side effects reported not previously addressed with a pharmacist or physician: None reported  Questions for the pharmacist: No    Confirmed patient received a Conservation Officer, Historic Buildings and a Surveyor, Mining with first shipment. The patient will receive a drug information handout for each medication shipped and additional FDA Medication Guides as required.       DISEASE/MEDICATION-SPECIFIC INFORMATION        For patients on injectable medications: Next injection is scheduled for 10/11/24.    SPECIALTY MEDICATION ADHERENCE     Medication Adherence    Patient reported X missed doses in the last month: 0  Specialty Medication: teriparatide  20 mcg/dose (545mcg/2.24mL) injection (FORTEO )  Patient is on additional specialty medications: No  Patient is on more than two specialty medications: No  Any gaps in refill history greater than 2 weeks in the last 3 months: no  Demonstrates understanding of importance of adherence: yes              Were doses missed due to medication being on hold? No    teriparatide  2 mg/ml: 13 days of medicine on hand       REFERRAL TO PHARMACIST     Referral to the pharmacist: Not needed      [facility]     Shipping address confirmed in Epic.     Cost and Payment: Patient has a $0 copay, payment information is not required.    Delivery Scheduled: Yes, Expected medication delivery date: 10/20/24.     Medication will be delivered via UPS to the prescription address in Epic WAM.    Kyra Myron   [facility] Specialty and Home Delivery Pharmacy  Specialty Technician

## 2024-10-13 DIAGNOSIS — G2581 Restless legs syndrome: Principal | ICD-10-CM

## 2024-10-13 MED ORDER — PRAMIPEXOLE 0.125 MG TABLET
ORAL_TABLET | Freq: Every evening | ORAL | 0 refills | 90.00000 days | Status: CP
Start: 2024-10-13 — End: ?

## 2024-10-19 MED FILL — TERIPARATIDE 20 MCG/DOSE (560 MCG/2.24 ML) SUBCUTANEOUS PEN INJECTOR: SUBCUTANEOUS | 28 days supply | Qty: 2.24 | Fill #3

## 2024-10-20 DIAGNOSIS — K7460 Unspecified cirrhosis of liver: Principal | ICD-10-CM

## 2024-10-20 DIAGNOSIS — S22060D Wedge compression fracture of T7-T8 vertebra, subsequent encounter for fracture with routine healing: Principal | ICD-10-CM

## 2024-10-20 NOTE — Progress Notes (Signed)
 [facility]  Video Visit Note       Patient's primary care physician: Rosina Mcgill, MD  Patient's physical location during visit [place], State[place], [place]  Name/Relationship of other individuals with the patient also on the call during virtual visit: N/A  I have confirmed that the patient requests and consents to having the above listed persons stay on the call during the phone visit.    I am located on-site and the patient is located off-site for this visit.      [phone]    RETURN PULMONARY CLINIC VISIT    Patient: Mary Owens(08-01-1961)   Referring physician: Rosina Mcgill  Reason for referral: COPD  Primary Care Physician: Rosina Earnie Mcgill, MD  Holistic Doc:  [facility] in [place] but moving to [place]    Normal Alpha-1: MM      EXACERBATIONS:  She had an exacerbation Jan 01, 2023 needing steroids/abx.    Exacerbation with resp failure and intubation Dec 2024  June 2025: abx/steroids  August 2025: admission to hospital with COPD exacerbation    When she has an exacerbation, she needs a longer taper    History of Present Illness    Mary Owens is a 63 y.o.female with hx of GOLD 2E COPD, PBC with cirrhosis, osteoporosis, mild OSA, psorasis, mild OSA whom I met in consultation with Dr. Rosina Mcgill for further assessment and treatment recommendations for her COPD.    I met Mary Owens in Aug 2022 and we didn't change her medications but sent her for a screening CT, pulm rehab and checked her alpha-1.  Covid Jan 2024   Neg stress echo    INTERIM HISTORY Last saw July 2024    11/24/23:  Last visit was in July and she was doing great.  Mary Owens was quite sick in December--was admitted and was intubated.  All of the sudden she had shortness of breath, wheezing.  She was at [facility], went to her car and got really SOB.  Called an ambulance and had a respiratory arrest.  Was intubated for 10 days.  She was feeling fine prior--no  URI sxs.  But her PCP had given her a zpak and pred for slight inc in cough.  [place] said she had pnuemonia in her L lung.  Her ammonia levels were high once extubated bc they didn't give her her medicine, ursodiol .    She was also dx'd with a DVT and started on [facility]    She is on advair  and spiriva --she wants to stay on that as it has worked well for her.  She has not had to use her nebulizer.  She is also on magnesium , potassium, lasix , and spironolactone .  She has also lost a lot of weight.    She is getting PT and home health at home.  Her vocal cords are damaged after the intubation and she is hoping it will improve.    She is now on 3L but going to be assessed Tuesday for an Inogen.  BUT--she has not resumed smoking!    She feels pretty good today.  Her daughter helps her shower.  Dressing herself, doing most of her ADLs and most of her cooking, but simple.  She has trouble getting out bc of the oxygen..    She has a compression fracture in T8.  Her PCP going to work with her on that, but she's not sure what.  She sees her again on the 31st.  She wakes up a lot at night but not gasping for air.  No morning HA.  No morning congestion.    04/22/24:  History of Present Illness  Mary Owens is a 63 year old female with COPD and primary biliary cirrhosis who presents for a follow-up visit.    She experiences a persistent cough and shortness of breath despite recent treatment with steroids and antibiotics. The cough has improved but remains present, and there is no sputum production. She finds her current inhaler, a generic version of Advair  (Wixela), ineffective and prefers the Advair  powder inhaler. She also uses Spiriva  Respimat and frequently uses a nebulizer, which she finds helpful.    She has a history of obstructive sleep apnea and osteoporosis, with a recent DEXA scan confirming osteoporosis and a spinal fracture causing significant pain. She is under the care of an endocrinologist for osteoporosis management.    In December, she experienced a significant illness that has left her fearful of being on a ventilator again. She can walk only half a block before needing to stop due to shortness of breath, which sometimes causes panic. No chest pain is associated with the shortness of breath.    She has a history of low iron  levels and is awaiting an infusion appointment in August. She cannot tolerate oral iron  due to gastrointestinal side effects. She was previously on Eliquis  but has since stopped it as per her hematologist's advice.    She has not participated in pulmonary rehabilitation before and is interested in exploring this option. She lives in [place] but is currently staying in [place], close to [place].    07/15/24  Today was a phone visit for f/u after hospitalization for another COPD exacerbation.  Our meeting was also truncated because of a fire alarm in our building.    She is feeling back to baseline today.  Her one leg is swollen after a cat bit and she's currently on doxycycline  for that.  Her breathing is doing much better, no major cough, sleeping fine (not waking up from respiratory complaints.    10/21/24  Her cat has bitten her again--better now, has been about 2 weeks.    Breathing is ok.  No prob  Liking the Trelegy and taking the azithromycin  every day.  Able to get the trelegy in ok.  Sleeping ok, no nocturnal symptoms.    She had an iron  infusion and felt better for a couple of days after that but then returned to normal.  Colonoscopy and endoscopy ok.    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 Clostridium difficile infection 09/18/2018    COPD (chronic obstructive pulmonary disease) (CMS-HCC)     Dental disease     Depression     Disorder of skin or subcutaneous tissue  Drug abuse (CMS-HCC)     GERD (gastroesophageal reflux disease) 01/02/2023    Hepatic steatosis     Hidradenitis     h/o    Iron  deficiency anemia 12/04/2017    Neutropenia     liver vs drug related, improved    Parotitis 03/13/2018    PBC (primary biliary cirrhosis)     Psoriasis 08/09/2020    Severe vulvar dysplasia     Sleep apnea     Smoker     Splenomegaly     Tobacco use disorder, moderate, 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     Past Surgical History:   Procedure Laterality Date    PR COLONOSCOPY FLX DX W/COLLJ SPEC WHEN PFRMD Left 10/07/2024    Procedure: COLONOSCOPY, FLEXIBLE, PROXIMAL TO SPLENIC FLEXURE; DIAGNOSTIC, W/WO COLLECTION SPECIMEN BY BRUSH OR WASH;  Surgeon: Erick Prentice HERO, MD;  Location: [facility];  Service: Gastroenterology    PR COLORECTAL SCRN; HI RISK IND N/A 05/29/2017    Procedure: COLOREC CANCR SCR; COLNSCPY;  Surgeon: Sim Donna Magnuson, MD;  Location: [facility];  Service: Gastroenterology    PR COLSC FLX W/RMVL OF TUMOR POLYP LESION SNARE TQ Left 02/24/2014    Procedure: COLONOSCOPY FLEX; W/REMOV TUMOR/LES BY SNARE;  Surgeon: Olam HERO Henle, MD;  Location: [facility]URES [facility];  Service: Gastroenterology    PR UPPER GI ENDOSCOPY,BIOPSY N/A 02/24/2014    Procedure: UGI ENDOSCOPY; WITH BIOPSY, SINGLE OR MULTIPLE;  Surgeon: Olam HERO Henle, MD;  Location: [facility];  Service: Gastroenterology    PR UPPER GI ENDOSCOPY,DIAGNOSIS N/A 05/29/2017    Procedure: UGI ENDO, INCLUDE ESOPHAGUS, STOMACH, & DUODENUM &/OR JEJUNUM; DX W/WO COLLECTION SPECIMN, BY BRUSH OR WASH;  Surgeon: Sim Donna Magnuson, MD;  Location: [facility];  Service: Gastroenterology    PR UPPER GI ENDOSCOPY,DIAGNOSIS N/A 10/07/2024    Procedure: UGI ENDO, INCLUDE ESOPHAGUS, STOMACH, & DUODENUM &/OR JEJUNUM; DX W/WO COLLECTION SPECIMN, BY BRUSH OR WASH;  Surgeon: Erick Prentice HERO, MD;  Location: [facility];  Service: Gastroenterology    SKIN BIOPSY      VULVECTOMY         Social History:     Social History     Tobacco Use    Smoking status: Former     Current packs/day: 0.00     Average packs/day: 0.5 packs/day for 36.6 years (18.3 ttl pk-yrs)     Types: Cigarettes     Start date: 11/05/1986     Quit date: 06/05/2023     Years since quitting: 1.3    Smokeless tobacco: Never   Vaping Use    Vaping status: Never Used   Substance Use Topics    Alcohol use: Yes     Alcohol/week: 16.0 standard drinks of alcohol     Types: 16 Cans of beer per week    Drug use: No     Frequency: 2.0 times per week     Types: Marijuana     Comment: Current marijuana use; hx of cocaine use, hasn't used cocaine in 8 years per patient     Lives alone in [place] but does stay with her boyfriend some.  Her ex husband (married but separated 24 years).  NO pets.  Smoked 1.5 ppd for 30 years.  Hx of drinking and using crack cocaine but that was years ago.  Tried vaping to stop smoking but that's the only time    Family History:  Family History   Problem Relation Age of Onset    Cancer Mother         brain    Asthma Mother     Diabetes Mother         Passed away with brain cancer    Hypertension Father     Heart failure Father     Melanoma Maternal Grandmother     Basal cell carcinoma Neg Hx     Squamous cell carcinoma Neg Hx      Mother died age 17.  She also had bronchial asthma and emphysema.  Father on O2 but not sure why.  She doesn't talk to him.     Medications:    Outpatient Encounter Medications as of 10/21/2024   Medication Sig Dispense Refill    azithromycin  (ZITHROMAX ) 250 MG tablet Take 1 tablet (250 mg total) by mouth daily. 30 tablet 6    fluticasone  propionate (FLONASE ) 50 mcg/actuation nasal spray 2 sprays into each nostril daily as needed. 16 g 11    fluticasone -umeclidin-vilanter (TRELEGY ELLIPTA ) 200-62.5-25 mcg inhaler Inhale 1 puff daily. 30 each 6    furosemide  (LASIX ) 40 MG tablet Take 1 tablet (40 mg total) by mouth daily. 100 tablet 3    ipratropium-albuterol  (DUO-NEB) 0.5-2.5 mg/3 mL nebulizer Inhale 3 mL by nebulization every six (6) hours as needed. 360 mL 3    [DISCONTINUED] VENTOLIN  HFA 90 mcg/actuation inhaler Inhale 2 puffs every six (6) hours as needed for wheezing or shortness of breath. 18 g 5    bisacodyl  (DULCOLAX) 5 mg EC tablet Take 2 tablets (10 mg total) by mouth Take as directed. Take 2 tablets (10 mg total) as directed for bowel prep. 2 tablet 0    empty container Misc Use as directed with Teriparatide . 1 each 0    hydrOXYzine  (ATARAX ) 25 MG tablet Take 1 tablet (25 mg total) by mouth every six (6) hours as needed. (can increase bedtime dose to 2 tabs as needed for itching) 120 tablet 11    lactulose  (CONSTULOSE ) 10 gram/15 mL solution Take 30 mL (20 g total) by mouth four (4) times a day as needed. TITRATE TO 3 TO 4 BOWEL MOVEMENTS DAILY 240 mL 11    multivit 33-mfolate-chrom-glut 2,500 mcg DFE- 1,000 mcg Tab Take 1 tablet by mouth daily after lunch.      multivitamin-Ca-iron -minerals Tab Take 1 tablet by mouth daily.      mupirocin  (BACTROBAN ) 2 % ointment Apply topically twice daily as needed to affected area 15 g 5    omeprazole  (PRILOSEC ) 20 MG capsule Take 1 capsule (20 mg total) by mouth daily. 90 capsule 3    oxyCODONE  (ROXICODONE ) 5 MG immediate release tablet Take 1 tablet (5 mg total) by mouth every eight (8) hours as needed for pain. 70 tablet 0    oxyCODONE  (ROXICODONE ) 5 MG immediate release tablet Take 1 tablet (5 mg total) by mouth every eight (8) hours as needed for pain. 70 tablet 0    [START ON 11/07/2024] oxyCODONE  (ROXICODONE ) 5 MG immediate release tablet Take 1 tablet (5 mg total) by mouth every eight (8) hours as needed for pain. 70 tablet 0    pen needle, diabetic (BD ULTRA-FINE NANO PEN NEEDLE) 32 gauge x 5/32 (4 mm) Ndle ok to sub any brand or size needle preferred by insurance/patient, use 1x/day for Teriparatide  injections 100 each 3    polyethylene glycol (CLEARLAX) 17 gram/dose powder Take as directed for extended bowel prep. 238 g  0    potassium chloride  20 MEQ ER tablet Take 1 tablet (20 mEq total) by mouth two (2) times a day. 60 tablet 11    pramipexole  (MIRAPEX ) 0.125 MG tablet Take 1 tablet by mouth nightly 90 tablet 0    rifAXIMin  (XIFAXAN ) 550 mg Tab Take 1 tablet (550 mg total) by mouth two (2) times a day. 180 tablet 3    spironolactone  (ALDACTONE ) 100 MG tablet Take 1 tablet (100 mg total) by mouth daily. 90 tablet 3    teriparatide  (FORTEO ) 20 mcg/dose (522mcg/2.24mL) injection Inject 0.08 mL (20 mcg total) under the skin daily. 2.24 mL 11    ursodiol  (URSO  FORTE) 500 MG tablet Take 1 tablet (500 mg total) by mouth two (2) times a day. 60 tablet 11    VENTOLIN  HFA 90 mcg/actuation inhaler Inhale 2 puffs every six (6) hours as needed for wheezing or shortness of breath. 18 g 5    vitamin A-2,400 mcg RAE, 8,000 units, 2,400 mcg RAE (8,000 units) capsule Take 6 capsules (14,400 mcg of RAE total) by mouth daily for 28 days, THEN 2 capsules (4,800 mcg of RAE total) daily.       No facility-administered encounter medications on file as of 10/21/2024.       Allergies:    Allergies as of 10/21/2024 - Reviewed 10/21/2024   Allergen Reaction Noted    Penicillins Rash 02/12/2013    Sulfa (sulfonamide antibiotics) Rash 02/12/2013         Physical Exam:    There were no vitals filed for this visit.   There is no height or weight on file to calculate BMI.    Phone visit today--she looks well.  Talking comfortably, not wearing oxygen, no cough, no audible wheezing  Area of cat bite looks ok--just a little red, no significant swelling      Ancillary Data:  All imaging and labs were reviewed personally.        03/27/21:          Chest CT (HRCT) 01/31/23:  No interstitial lung  disease.   Moderate centrilobular emphysema.   Stable 0.5 cm subpleural nodule in the left lower lobe. No new or enlarging pulmonary nodule.   No air trapping.        PSG 05/24/21:    Recommendations  Continue conservative measures for treatment of apnea, including treatment of any nasal rhinitis, interventions for reflux if present, and weight loss.  The patient should be also be advised that sleeping on her side is likely to be better than on her back.   Evaluate for underlying causes for borderline hypoxemia and hypoxemia in REM sleep. This might be further optimized with weight loss and optimal treatment of asthma/ COPD.  If oxygen remains low, she might benefit from oxygen supplementation at night.  The patient may benefit from strict and complete elimination of fluids in the three hours prior to bedtime to reduce nocturia tendencies      Recent o/n oximetry (feb 2024) demonstrated she didn't drop 88% or below for more than .  (Notes from Dr. Art)    Laboratory Data  Lab Results   Component Value Date    WBC 1.3 (L) 08/31/2024    HGB 14.6 08/31/2024    HCT 42.6 08/31/2024    PLT 74 (L) 08/31/2024     Lab Results   Component Value Date    EOSABS 0.0 08/31/2024    EOSABS 0.0 04/08/2024    EOSABS 0.0 08/25/2023  No components found for: ABG      Chemistry        Component Value Date/Time    NA 143 08/31/2024 1521    NA 138 01/10/2015 1548    K 3.5 08/31/2024 1521    K 4.0 01/10/2015 1548    CL 104 08/31/2024 1521    CL 100 01/10/2015 1548    CO2 28.0 08/31/2024 1521    CO2 27 01/10/2015 1548    BUN 10 08/31/2024 1521    BUN 12 01/10/2015 1548    BUN 13 07/27/2012 1158    CREATININE 0.64 08/31/2024 1521    CREATININE 0.56 (L) 01/10/2015 1548    CREATININE 0.58 (L) 07/27/2012 1158    GLU 93 08/31/2024 1521        Component Value Date/Time    CALCIUM 9.4 08/31/2024 1521    CALCIUM 9.0 01/10/2015 1548    ALKPHOS 132 (H) 07/13/2024 1549    ALKPHOS 209 (H) 01/10/2015 1548    AST 24 07/13/2024 1549    AST 31 01/10/2015 1548    ALT 23 07/13/2024 1549    ALT 40 01/10/2015 1548    BILITOT 2.8 (H) 07/13/2024 1549    BILITOT 0.5 01/10/2015 1548          Assessment and Plan:    Sheryn Aldaz is a 63 y.o.female GOLD 2E (formerly 2D) COPD, PBC with cirrhosis, osteoporosis, borderline OSA, mild scalp psorasis, who is here for f/u after being hospitalized with acute hypoxic resp failure in December 2025, necessitating intubation and mechanical ventilation for 10 days.    Not a candidate for RELIANCE due to her cirrhosis    She seems to be doing well and no exacerbations after starting the chronic azithromycin !  She is tolerating and able to do the Trelegy so I think we are in a good spot now.  She knows to call if she gets sick, particularly since she won't take the flu shot.  Will repeat PFTs with f/u visit as it will have been over 2 years and given her underlying liver (and lung) disease it's worth following    Con't her CPAP      Immunization History   Administered Date(s) Administered    DT 01/18/2014    HEPATITIS B VACCINE ADULT, ADJUVANTED, IM(HEPLISAV B) 08/02/2020, 07/13/2024    Hep A / Hep B 12/11/2012, 01/29/2013, 06/18/2013    Hepatitis B Vaccine, Dialysis 06/01/2020, 12/06/2020    Influenza Vaccine Quad(IM)6 MO-Adult(PF) 12/11/2012, 08/05/2014, 11/16/2015, 09/20/2016    PCV21 (Capvaxive) (Pneumococcal 21-valent Conjugate Vaccine) 01/23/2024    PNEUMOCOCCAL POLYSACCHARIDE 23-VALENT 06/24/2011    RSV VACCINE, BIVALENT(PF)(ABRYSVO) 01/23/2023    TdaP 12/01/2021       The above plan was discussed with the patient and she is in agreement.      Return in about 6 months (around 04/21/2025).    The above plan was discussed with the patient and she is in agreement.             The patient reports they are physically located in [place]  and is currently: at home. I conducted a audio/video visit. I spent 14 minutes on the video call with the patient. I spent an additional 7 minutes on pre- and post-visit activities on the date of service .       Rosina KANDICE Sauers, MD

## 2024-10-20 NOTE — Progress Notes (Signed)
 [facility] [place] [facility]  Care Management Progress Note    Date: 10/20/2024  Outcome:  Phone outreach completed    Is this patient in [place] ? Yes    Purpose of contact:           CM contacted patient for a routine CCM call. Patient shares that she is doing well and had no concerns at the time of the call.     Patient reports that she was recently bitten on the leg by her cat and is now experiencing leg soreness and swelling in her foot. CM offered to schedule a medical appointment to address the issue, but the patient declined.    #back pain: Patient rates her pain today a 8 out 10 on pain scale. Patient continues to take her prescribed medication for pain.     Provider/Care Partner(s) to follow up on:   N/A    Health Maintenance:  Health Maintenance Due   Topic Date Due    Zoster Vaccines (1 of 2) Never done    COVID-19 Vaccine (1 - 2025-26 season) Never done    Influenza Vaccine (1) 07/05/2024     Additional Information/Plan:  Patient provided my direct contact information and encouraged to contact me should additional needs arise.    Time Spent Per Day:  Chart review was completed prior to outreach attempt.   10/20/2024: 11    Drucella Karbowski, [employer] Fellow  [employer] Health Care Manager  [facility] [place]

## 2024-10-21 ENCOUNTER — Encounter
Admit: 2024-10-21 | Discharge: 2024-10-22 | Payer: Medicare (Managed Care) | Attending: Pulmonary Disease | Primary: Pulmonary Disease

## 2024-10-21 DIAGNOSIS — J449 Chronic obstructive pulmonary disease, unspecified: Principal | ICD-10-CM

## 2024-10-21 MED ORDER — VENTOLIN HFA 90 MCG/ACTUATION AEROSOL INHALER
Freq: Four times a day (QID) | RESPIRATORY_TRACT | 5 refills | 0.00000 days | Status: CP | PRN
Start: 2024-10-21 — End: 2025-10-21

## 2024-10-21 NOTE — Patient Instructions (Addendum)
 Keep taking the Trelegy and the azithro!  Be careful this winter  Winter precautions:  Wash hands frequently.  Try to avoid those with illness.  Minimize touching your face.  Consider wearing a mask with lots of people exposure--needs to be a N95 or equivalent  Call if you feel problems getting into your chest--don't wait too late to call.  Will get breathing tests and a walk on you with next visit    See you in 6 months

## 2024-10-22 DIAGNOSIS — R21 Rash and other nonspecific skin eruption: Principal | ICD-10-CM

## 2024-10-22 MED ORDER — MUPIROCIN 2 % TOPICAL OINTMENT
TOPICAL | 5 refills | 0.00000 days | Status: CP
Start: 2024-10-22 — End: ?

## 2024-11-07 MED ORDER — OXYCODONE 5 MG TABLET
ORAL_TABLET | Freq: Three times a day (TID) | ORAL | 0 refills | 24.00000 days | Status: CP | PRN
Start: 2024-11-07 — End: 2024-12-07

## 2024-11-08 DIAGNOSIS — E876 Hypokalemia: Principal | ICD-10-CM

## 2024-11-08 DIAGNOSIS — E509 Vitamin A deficiency, unspecified: Principal | ICD-10-CM

## 2024-11-08 DIAGNOSIS — L409 Psoriasis, unspecified: Principal | ICD-10-CM

## 2024-11-08 DIAGNOSIS — R21 Rash and other nonspecific skin eruption: Principal | ICD-10-CM

## 2024-11-08 DIAGNOSIS — S22060D Wedge compression fracture of T7-T8 vertebra, subsequent encounter for fracture with routine healing: Principal | ICD-10-CM

## 2024-11-08 DIAGNOSIS — E611 Iron deficiency: Principal | ICD-10-CM

## 2024-11-08 MED ORDER — TRIAMCINOLONE ACETONIDE 0.1 % TOPICAL CREAM
Freq: Two times a day (BID) | TOPICAL | 3 refills | 0.00000 days | Status: CP | PRN
Start: 2024-11-08 — End: 2025-11-08

## 2024-11-08 NOTE — Progress Notes (Signed)
 Video Visit Note  This medical encounter was conducted virtually using [email or web address]  TeleHealth protocols.    I have identified myself to the patient and conveyed my credentials to Mary Owens  Patient/proxy has provided verbal consent to proceed with this video visit.  In case we get disconnected, [phone] (home)   In case of Emergency, patient's current location: Home: [address]  [place] [address]  Is there someone else in the room? No.     Mary Owens is a 64 y.o. female that presents for a video visit today regarding the following issues:    Assessment/Plan:      Problem List Items Addressed This Visit          Digestive    Primary biliary cirrhosis (CMS-HCC)    Cirrhosis of liver    (CMS-HCC)       Respiratory    COPD (chronic obstructive pulmonary disease) (CMS-HCC)    The condition is controlled based upon today's assessment.  Under treatment or active surveillance by appropriate specialist.                Nervous and Auditory    Hepatic encephalopathy    (CMS-HCC)    The condition is controlled based upon today's assessment.  Under treatment or active surveillance by appropriate specialist.                Musculoskeletal and Integument    Psoriasis - Primary    Relevant Medications    triamcinolone  (KENALOG ) 0.1 % cream    Closed wedge compression fracture of T8 vertebra    (CMS-HCC)    Patient with compression fracture 08/2023 due to osteoporosis. Has been extremely painful and pain treatment options are limited.   -per report from my colleague calcitonin was denied. she had GI bleed with NSAIDs. not able to use tylenol  with her cirrhosis  -lidocaine  patch, gabapentin , robaxin  not effective.   -is sp consultation at [facility] pain clinic. They are not taking over opiate pain management  -saw spine surgeon at [facility], but due to transportation limitations, would like to transition all of her outpatient care back to [facility]. [facility] had discussed possible kyphoplasty with her sp endocrine treatment. Sp [place] spine visit, not recommending surgery  -Verbal consent completed 12/05/23. Reviewed basics of our controlled substance agreement verbally. (Unfortunately I did not have her sign this at last in person visit)  -oxycodone  5mg  TID prn #70 2/4, 3/6, 4/4     Complete Pain Management Assessment        - Periodic review to reassess this plan has been done  - Current  Daily Morphine equivalents: : 15-19 MME/Day  - CDC guidelines for prescribing opioids for chronic pain have been followed     Previously tried and failed Short acting Opiates: none tried     Previously tried and failed Longing acting Opiates: none tried     - I verify informed consent for this treatment plan.  -  Pain Agreement Status: Not on file  - I verify that I checked the [place] Controlled Substance      SmartLink not supported outside of the Encounter Diagnoses SmartSection.           Relevant Medications    oxyCODONE  (ROXICODONE ) 5 MG immediate release tablet (Start on 12/08/2024)    oxyCODONE  (ROXICODONE ) 5 MG immediate release tablet (Start on 02/05/2025)    oxyCODONE  (ROXICODONE ) 5 MG immediate release tablet (Start on 01/07/2025)       Hematopoietic and  Hemostatic    Other pancytopenia (CMS-HCC)    The condition is stable based upon today's assessment.  Under treatment or active surveillance by appropriate specialist.                Other    Vitamin A deficiency    Relevant Orders    Vitamin A    Zinc Level, Serum    Iron  deficiency    Relevant Orders    Ferritin    Iron  & TIBC    Hypokalemia    Relevant Orders    Basic Metabolic Panel    Depression, major, recurrent, moderate (CMS-HCC)    Stable based upon today's assessment.  Continue current treatment plan and follow up at least yearly.              Other Visit Diagnoses         Rash        Relevant Medications    triamcinolone  (KENALOG ) 0.1 % cream          Assessment & Plan  Chronic skin lesions of the lower extremity (including psoriasis and rash surrounding previous cat bite)  Chronic skin lesions include a cat bite wound and a new lesion possibly related to psoriasis. The cat bite wound is dark, dry, and sore, with no infection. The new lesion is dry and scaly, possibly psoriasis, but not itchy. (Challenging to see the lesions on this video visit, above is mostly patient report, but she does have a lesion surrounding the area of the cat bite and then has a small lesion more distally that looks to be a scaly plaque, which I think is psoriasis)  - Prescribed triamcinolone  cream for rough areas of the lesions, to use until smooth.  - Instructed to discontinue antibiotic ointment.  - Advised to show lesions to dermatologist next month.  - Recommended taking pictures of lesions before applying medication for comparison with dermatologist.    Chronic pruritus  Chronic itching, particularly at night, possibly related to liver disease or dry skin. Itching is worsening despite current management. 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 She doesn't remember ever taking cholestyramine. Briefly discussed it, but she was nervous about potential constipation. Encouraged her to bring up this symptoms with her liver team at next visit.       Compression fracture of T8 vertebra, routine healing  Osteoporosis  Chronic Pain  Compression fracture of T8 vertebra with ongoing pain. Appreciate endocrine assistance.   - Continue teriparatide  per endocrine  - Prescribed oxycodone  for pain management, with refills for February, March, and April.    Vitamin A deficiency  Vitamin A deficiency, currently taking supplements.  - Ordered vitamin A level test.    Hypokalemia  Hypokalemia, currently taking potassium supplements.  - Ordered potassium level test.    Iron  deficiency  RLS  Iron  deficiency, recent iron  infusion provided temporary relief from fatigue.  - Ordered iron  panel and ferritin level tests.        Followup:  Return in about 5 weeks (around 12/13/2024) for follow up multiple things, video visit.      The patient reports they are physically located in [place]  and is currently: at home. I conducted a audio/video visit. I spent  75m 04s on the video call with the patient. I spent an additional 4 minutes on pre- and post-visit activities on the date of service .         Subjective:  HPI  Mary Owens is a 64 y.o. female requesting a video visit to discuss the following issues:    History of Present Illness  Mary Owens is a 64 year old female who presents for follow-up on her lung condition and other health concerns.    Her lung condition has improved significantly with the use of an inhaler and a daily antibiotic. She feels these treatments have been very effective, as she had been struggling with her lungs for a long time but now feels better.    She mentions a cat bite on her leg from two months ago that remains dark and sore. Despite applying antibiotic ointment, the area is still dry and has not healed. The area around the scab is dry, but there is no oozing or swelling, unlike previous bites.    She experiences severe itching at night, which she describes as intense enough to require a back scratcher. She suspects it might be related to her liver condition.     She is currently taking shots every evening to strengthen her bones, but her back pain has not improved. She takes pain medication, which she finds more effective when taking two pills in the evening after her daily activities.    She is also taking potassium supplements and has had an iron  infusion, which seemed to help her energy levels for a few days before returning to low energy.        ROS  As per HPI.    HISTORY  I have reviewed the patient's problem list, current medications, and allergies and have updated/reconciled them as needed.    Objective:   Patient Reported Blood Pressure Readings    General: Well appearing , in no acute distress  Skin: Color is normal. No rashes.  Psych: Appropriate affect, normal mood  Resp: Normal work of breathing, no retractions

## 2024-11-08 NOTE — Patient Instructions (Signed)
 Thank you for choosing [facility] at [facility] for your care.    Regarding MyChart messages:  I want to provide you with the best care possible. Due to high message volume and very limited time allocated for responding to messages outside of appointments, my response times will likely be delayed. This is most likely for messages not connected to a recent visit with me. For time sensitive requests, please schedule an appointment.   Please allow at least 72 business hours for responses to My [facility] chart messages. This is NOT for URGENT needs.    If you need to schedule an appointment:     Schedule through [facility] OR call us  at [phone]    If you need to request medication refills:  Please have your pharmacy send a request electronically OR request a refill via [facility].     Urgent medical issue after normal business hours, on weekends, or during holidays:    [facility]  at [address] in [place] provides extended hours on evenings and weekends. Click here for latest info.    Dr. Minerva   Phone: [phone] I Fax: [phone]

## 2024-11-10 NOTE — Assessment & Plan Note (Signed)
 Patient with compression fracture 08/2023 due to osteoporosis. Has been extremely painful and pain treatment options are limited.   -per report from my colleague calcitonin was denied. she had GI bleed with NSAIDs. not able to use tylenol  with her cirrhosis  -lidocaine  patch, gabapentin , robaxin  not effective.   -is sp consultation at [facility] pain clinic. They are not taking over opiate pain management  -saw spine surgeon at [facility], but due to transportation limitations, would like to transition all of her outpatient care back to [facility]. [facility] had discussed possible kyphoplasty with her sp endocrine treatment. Sp [place] spine visit, not recommending surgery  -Verbal consent completed 12/05/23. Reviewed basics of our controlled substance agreement verbally. (Unfortunately I did not have her sign this at last in person visit)  -oxycodone  5mg  TID prn #70 2/4, 3/6, 4/4     Complete Pain Management Assessment        - Periodic review to reassess this plan has been done  - Current  Daily Morphine equivalents: : 15-19 MME/Day  - CDC guidelines for prescribing opioids for chronic pain have been followed     Previously tried and failed Short acting Opiates: none tried     Previously tried and failed Longing acting Opiates: none tried     - I verify informed consent for this treatment plan.  -  Pain Agreement Status: Not on file  - I verify that I checked the [place] Controlled Substance      SmartLink not supported outside of the Encounter Diagnoses SmartSection.

## 2024-11-10 NOTE — Assessment & Plan Note (Signed)
 The condition is stable based upon today's assessment.  Under treatment or active surveillance by appropriate specialist.

## 2024-11-10 NOTE — Assessment & Plan Note (Signed)
The condition is controlled based upon today's assessment.  Under treatment or active surveillance by appropriate specialist.

## 2024-11-10 NOTE — Assessment & Plan Note (Signed)
 Stable based upon today's assessment.  Continue current treatment plan and follow up at least yearly.

## 2024-11-11 MED ORDER — EMPTY CONTAINER
0 refills | 0.00000 days
Start: 2024-11-11 — End: ?

## 2024-11-11 MED ORDER — TRELEGY ELLIPTA 200 MCG-62.5 MCG-25 MCG POWDER FOR INHALATION
Freq: Every day | RESPIRATORY_TRACT | 5 refills | 60.00000 days | Status: CP
Start: 2024-11-11 — End: ?

## 2024-11-11 NOTE — Telephone Encounter (Signed)
 Pharmacy requesting refill    Last visit: 10/21/2024  Next visit: 04/07/2025    RX routed to Dr. Charlott for review    Requested Prescriptions     Pending Prescriptions Disp Refills    fluticasone -umeclidin-vilanter (TRELEGY ELLIPTA ) 200-62.5-25 mcg inhaler [Pharmacy Med Name: Trelegy Ellipta  200-62.5-25 MCG/ACT Inhalation Aerosol Powder Breath Activated] 60 each 5     Sig: Inhale 1 puff daily.

## 2024-11-11 NOTE — Progress Notes (Signed)
 [facility] Refill Coordination Note    Specialty Medication(s) to be Shipped:   General Specialty: teriparatide     Other medication(s) to be shipped: empty container Misc, pen needle, diabetic 32 gauge x 5/32 (4 mm) Ndle (BD ULTRA-FINE NANO PEN NEEDLE)    Specialty Medications not needed at this time: N/A     Mary Owens, DOB: [birth year]  Phone: [phone] (home)       All above HIPAA information was verified with patient.     Was a nurse, learning disability used for this call? No    Completed refill call assessment today to schedule patient's medication shipment from the [facility]  [phone]).  All relevant notes have been reviewed.     Specialty medication(s) and dose(s) confirmed: Regimen is correct and unchanged.   Changes to medications: Rhema reports no changes at this time.  Changes to insurance: No  New side effects reported not previously addressed with a pharmacist or physician: None reported  Questions for the pharmacist: No    Confirmed patient received a Conservation Officer, Historic Buildings and a Surveyor, Mining with first shipment. The patient will receive a drug information handout for each medication shipped and additional FDA Medication Guides as required.       DISEASE/MEDICATION-SPECIFIC INFORMATION        N/A    SPECIALTY MEDICATION ADHERENCE     Medication Adherence    Patient reported X missed doses in the last month: 1  Specialty Medication: teriparatide  20 mcg/dose (535mcg/2.24mL) injection (FORTEO )  Patient is on additional specialty medications: No  Patient is on more than two specialty medications: No  Any gaps in refill history greater than 2 weeks in the last 3 months: no  Demonstrates understanding of importance of adherence: yes              Were doses missed due to medication being on hold? No    teriparatide  20  mg/ml: 7 days of medicine on hand       Specialty medication is an injection or given on a cycle: No    REFERRAL TO PHARMACIST     Referral to the pharmacist: Not needed      [facility]     Shipping address confirmed in Epic.     Cost and Payment: Patient has a copay of $6.62. They are aware and have authorized the pharmacy to charge the credit card on file.    Delivery Scheduled: Yes, Expected medication delivery date: 11/17/24.     Medication will be delivered via UPS to the prescription address in Epic WAM.    Kyra Myron   [facility]  Specialty Technician

## 2024-11-12 ENCOUNTER — Ambulatory Visit: Admit: 2024-11-12 | Discharge: 2024-11-13 | Payer: Medicare (Managed Care)

## 2024-11-12 ENCOUNTER — Encounter: Admit: 2024-11-12 | Discharge: 2024-11-13 | Payer: Medicare (Managed Care)

## 2024-11-12 DIAGNOSIS — S22060D Wedge compression fracture of T7-T8 vertebra, subsequent encounter for fracture with routine healing: Principal | ICD-10-CM

## 2024-11-12 DIAGNOSIS — Z79899 Other long term (current) drug therapy: Principal | ICD-10-CM

## 2024-11-12 DIAGNOSIS — M8000XD Age-related osteoporosis with current pathological fracture, unspecified site, subsequent encounter for fracture with routine healing: Principal | ICD-10-CM

## 2024-11-12 DIAGNOSIS — Z5181 Encounter for therapeutic drug level monitoring: Principal | ICD-10-CM

## 2024-11-12 NOTE — Progress Notes (Signed)
 The patient reports they are physically located in [place]  and is currently: at home. I conducted a audio/video visit. I spent  19m 49s on the video call with the patient. I spent an additional 15 minutes on pre- and post-visit activities on the date of service .     Reason for follow up: Severe osteoporosis with recent vertebral compression fx     Referring Provider: Rosina Earnie Mcgill    Primary Care Provider: Mcgill Rosina Earnie, MD      Assessment/Plan:      1. Severe osteoporosis with current vertebral fx  2. T8 vertebral fx  3. Teriparatide  therapy monitoring  -without previous fragility fracture.   - Risk factors for osteoporosis identified: early menopause, alcohol/smoking, PBC, cirrhosis, glucocorticoids.   - additional work up for secondary causes of osteoporosis: normal calcium, vitamin d, phosphorus, renal function, TSH. No clinical signs of Cushing's. Her alk phos is chronically mildly elevated due to underlying PBC  - hx 03/2024- at her previous visit we discussed she has severe osteoporosis which puts her at high risk for low trauma fractures and should be treated in order to reduce her risk of fractures. She was going to think on Reclast and never returned to visit. Now returns after had T8 compression fx 08/2023 30% height loss  - hx 06/25/24- During last visit we discussed initiation of PTH analog pending 24h urine results to rule out hypercalciuria. She had a 24h urine calcium collection in 03/2024 that showed hypercalciuria in the setting of lasix  and spironolactone  which could lead to falsely high urinary calcium therefore we discussed repeating collection after holding these medications. In addition, her PTH, Phos and calcium are normal, making PHP less likely. Today she reports held diuretics and all supplements for 1 week and she just dropped off second 24h urine calcium collection today, results pending. Repeat DXA on 04/21/24 continued to reveal severe osteoporosis at [facility] and hip.   - Interval hx 11/12/24 Since last visit, repeat 24 h urine calcium was normal after holding diuretics and supplements therefore proceeded with Tymlos  and switched to Forteo  per insurance preference.  Labs from 08/31/24 normal calcium and GFR. She reports started Teriparatide  at bedtime 3 mo (08/2024) with good compliance without side effects.  Plan:  - c/w Teriparatide  20 mg Subc every day to complete 2 yrs (08/2024-07/2026)  - After completion of anabolic osteoporosis therapy will transition to Reclast   - monitor vitamin d, calcium and renal function while on therapy  - continue with vitamin d supplementation to keep goal vitamin d > 30 and calcium intake/supplementation equivalent to 1000-1200 mg daily.   - Monitor DXA in 2 yrs (04/2026)    Orders Placed This Encounter   Procedures    Vitamin D 25 hydroxy         All questions were answered and patient agrees with plan.     Follow up with Endocrinologist in about 6 months (around 05/12/2025).      Thank you for referring your patient to our endocrine clinic for evaluation. Please do not hesitate to contact me with any questions.     Sheree Manes, MD  [facility] Endocrinology  Phone [phone]  Fax [phone]    Subjective:       Mary Owens is a 64 y.o. female with history of Psoriasis, COPD, Primary Biliary Cirrhosis who is seen at the request of Rosina Earnie Mcgill in consultation for follow up evaluation of osteoporosis. Last seen 06/25/24    Interval  hx 11/12/24    Since last visit, repeat 24 h urine calcium was normal after holding diuretics and supplements therefore proceeded with Tymlos  and switched to Forteo  per insurance preference.  Labs from 08/31/24 normal calcium and GFR. She reports started Teriparatide  at bedtime 3 mo (08/2024) with good compliance without side effects.         Interval hx 06/25/24    During last visit we discussed initiation of PTH analog pending 24h urine results to rule out hypercalciuria. She had a 24h urine calcium collection in 03/2024 that showed hypercalciuria in the setting of lasix  and spironolactone  which could lead to falsely high urinary calcium therefore we discussed repeating collection after holding these medications. Today she reports held diuretics and all supplements for 1 week and she just dropped off second 24h urine calcium collection today, results pending. Repeat DXA on 04/21/24 continued to reveal severe osteoporosis at [facility] and hip.      Interval hx 03/2024    She returns after recent vertbral fx      Initial encounter 09/18/21  Pertinent Bone health History:  Fracture: denied  Fracture in Family members: mother  Previous Medication use:  she reports took fosamax  for short period of time, not sure why she stopped taking, it appears as part of her prescriptions back to 2020/2021  Hyperthyroidism: denied  Gonadal Function:  Menarche: About 64 years old  Menopause: About around late 60-early 62s years old. Denies HRT  Vitamin D: not taking vitamin d. Taking 50+ once a day vitamin (vitamin d 1000 daily)  Calcium: 300 mg daily  Kidney Stones: denied  Hypercalcemia: denies  Steroid use: receives steroids once or twice a year.   Changes in weight: stable  Smoking: stopped smoking 6 yrs ago  Alcohol:  used to drink 10 yrs, used to drink 2-3 bottle liqueur or wine a day.   Malabsorption: no chronic diarrhea  History of early loss of primary teeth: denied  Anemia: iron  deficiency  Falls in the past month: denied  Exercise: she has been going to [facility] hospital for Lung rehab  Vegan/Vegetarian diet: denies  Rheumatoid Arthritis (or other autoimmune conditions):     Dental work needed?: no teeth, has dentures, denies early tooth loss.       Family History:  Denies history of fractures, hypercalcemia or kidney stones in the family. Denies family history of pituitary, hypothalamic,parathyroid, adrenal or pancreatic tumors.          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    Clostridium difficile infection 09/18/2018    COPD (chronic obstructive pulmonary disease) (CMS-HCC)     Dental disease     Depression     Disorder of skin or subcutaneous tissue     Drug abuse (CMS-HCC)     GERD (gastroesophageal reflux disease) 01/02/2023    Hepatic steatosis     Hidradenitis     h/o    Iron  deficiency anemia 12/04/2017    Neutropenia     liver vs drug related, improved    Parotitis 03/13/2018    PBC (primary biliary cirrhosis)     Psoriasis 08/09/2020    Severe vulvar dysplasia     Sleep apnea     Smoker     Splenomegaly     Tobacco use disorder, moderate, 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  Allergies   Allergen Reactions    Penicillins Rash     Other reaction(s): RASH  Other reaction(s): RASH    Sulfa (Sulfonamide Antibiotics) Rash     Other reaction(s): RASH  Other reaction(s): RASH     Social History     Socioeconomic History    Marital status: Widowed     Spouse name: None    Number of children: 3    Years of education: None    Highest education level: None   Occupational History    Occupation: not working   Tobacco Use    Smoking status: Former     Current packs/day: 0.00     Average packs/day: 0.5 packs/day for 36.6 years (18.3 ttl pk-yrs)     Types: Cigarettes     Start date: 11/05/1986     Quit date: 06/05/2023     Years since quitting: 1.4    Smokeless tobacco: Never   Vaping Use    Vaping status: Never Used   Substance and Sexual Activity    Alcohol use: Yes     Alcohol/week: 16.0 standard drinks of alcohol     Types: 16 Cans of beer per week    Drug use: No     Frequency: 2.0 times per week     Types: Marijuana     Comment: Current marijuana use; hx of cocaine use, hasn't used cocaine in 8 years per patient    Sexual activity: Yes     Partners: Male   Other Topics Concern    Do you use sunscreen? Yes    Tanning bed use? No    Are you easily burned? Yes    Excessive sun exposure? No    Blistering sunburns? No   Social History Narrative Lives with boyfriend     Social Drivers of Health     Food Insecurity: No Food Insecurity (03/04/2024)    Hunger Vital Sign     Worried About Running Out of Food in the Last Year: Never true     Ran Out of Food in the Last Year: Never true   Tobacco Use: Medium Risk (11/12/2024)    Patient History     Smoking Tobacco Use: Former     Smokeless Tobacco Use: Never   Transportation Needs: No Transportation Needs (03/04/2024)    PRAPARE - Therapist, Art (Medical): No     Lack of Transportation (Non-Medical): No   Alcohol Use: Patient Declined (04/03/2022)    Received from [facility]    AUDIT-C     Q1: How often do you have a drink containing alcohol?: Patient declined     Q2: How many drinks containing alcohol do you have on a typical day when you are drinking?: Patient declined     Q3: How often do you have six or more drinks on one occasion?: Patient declined   Housing: Low Risk (03/22/2024)    Housing     Within the past 12 months, have you ever stayed: outside, in a car, in a tent, in an overnight shelter, or temporarily in someone else's home (i.e. couch-surfing)?: No     Are you worried about losing your housing?: No   Utilities: Low Risk (03/22/2024)    Utilities     Within the past 12 months, have you been unable to get utilities (heat, electricity) when it was really needed?: No   Stress: No Stress Concern Present (04/03/2022)    Received from [facility]  [employer] of Occupational Health - Occupational Stress Questionnaire     Feeling of Stress : Not at all   Interpersonal Safety: Not At Risk (03/09/2024)    Interpersonal Safety     Unsafe Where You Currently Live: No     Physically Hurt by Anyone: No     Abused by Anyone: No   Financial Resource Strain: Low Risk (12/31/2023)    Overall Financial Resource Strain (CARDIA)     Difficulty of Paying Living Expenses: Not hard at all   Recent Concern: Financial Resource Strain - Medium Risk (10/14/2023)    Overall Financial Resource Strain (CARDIA)     Difficulty of Paying Living Expenses: Somewhat hard   Internet Connectivity: Internet connectivity concern unknown (03/22/2024)    Internet Connectivity     Do you have access to internet services: Yes      Past Surgical History:   Procedure Laterality Date    PR COLONOSCOPY FLX DX W/COLLJ SPEC WHEN PFRMD Left 10/07/2024    Procedure: COLONOSCOPY, FLEXIBLE, PROXIMAL TO SPLENIC FLEXURE; DIAGNOSTIC, W/WO COLLECTION SPECIMEN BY BRUSH OR WASH;  Surgeon: Erick Prentice HERO, MD;  Location: [facility];  Service: Gastroenterology    PR COLORECTAL SCRN; HI RISK IND N/A 05/29/2017    Procedure: COLOREC CANCR SCR; COLNSCPY;  Surgeon: Sim Donna Magnuson, MD;  Location: [facility];  Service: Gastroenterology    PR COLSC FLX W/RMVL OF TUMOR POLYP LESION SNARE TQ Left 02/24/2014    Procedure: COLONOSCOPY FLEX; W/REMOV TUMOR/LES BY SNARE;  Surgeon: Olam HERO Henle, MD;  Location: [facility];  Service: Gastroenterology    PR UPPER GI ENDOSCOPY,BIOPSY N/A 02/24/2014    Procedure: UGI ENDOSCOPY; WITH BIOPSY, SINGLE OR MULTIPLE;  Surgeon: Olam HERO Henle, MD;  Location: [facility];  Service: Gastroenterology    PR UPPER GI ENDOSCOPY,DIAGNOSIS N/A 05/29/2017    Procedure: UGI ENDO, INCLUDE ESOPHAGUS, STOMACH, & DUODENUM &/OR JEJUNUM; DX W/WO COLLECTION SPECIMN, BY BRUSH OR WASH;  Surgeon: Sim Donna Magnuson, MD;  Location: [facility];  Service: Gastroenterology    PR UPPER GI ENDOSCOPY,DIAGNOSIS N/A 10/07/2024    Procedure: UGI ENDO, INCLUDE ESOPHAGUS, STOMACH, & DUODENUM &/OR JEJUNUM; DX W/WO COLLECTION SPECIMN, BY BRUSH OR WASH;  Surgeon: Erick Prentice HERO, MD;  Location: [facility];  Service: Gastroenterology    SKIN BIOPSY      VULVECTOMY          Current Outpatient Medications:     azithromycin  (ZITHROMAX ) 250 MG tablet, Take 1 tablet (250 mg total) by mouth daily., Disp: 30 tablet, Rfl: 6    bisacodyl  (DULCOLAX) 5 mg EC tablet, Take 2 tablets (10 mg total) by mouth Take as directed. Take 2 tablets (10 mg total) as directed for bowel prep., Disp: 2 tablet, Rfl: 0    empty container Misc, Use as directed with Teriparatide ., Disp: 1 each, Rfl: 0    fluticasone  propionate (FLONASE ) 50 mcg/actuation nasal spray, 2 sprays into each nostril daily as needed., Disp: 16 g, Rfl: 11    fluticasone -umeclidin-vilanter (TRELEGY ELLIPTA ) 200-62.5-25 mcg inhaler, Inhale 1 puff daily., Disp: 60 each, Rfl: 5    furosemide  (LASIX ) 40 MG tablet, Take 1 tablet (40 mg total) by mouth daily., Disp: 100 tablet, Rfl: 3    hydrOXYzine  (ATARAX ) 25 MG tablet, Take 1 tablet (25 mg total) by mouth every six (6) hours as needed. (can increase bedtime dose to 2 tabs as needed for itching), Disp: 120 tablet,  Rfl: 11    ipratropium-albuterol  (DUO-NEB) 0.5-2.5 mg/3 mL nebulizer, Inhale 3 mL by nebulization every six (6) hours as needed., Disp: 360 mL, Rfl: 3    lactulose  (CONSTULOSE ) 10 gram/15 mL solution, Take 30 mL (20 g total) by mouth four (4) times a day as needed. TITRATE TO 3 TO 4 BOWEL MOVEMENTS DAILY, Disp: 240 mL, Rfl: 11    multivit 33-mfolate-chrom-glut 2,500 mcg DFE- 1,000 mcg Tab, Take 1 tablet by mouth daily after lunch., Disp: , Rfl:     multivitamin-Ca-iron -minerals Tab, Take 1 tablet by mouth daily., Disp: , Rfl:     mupirocin  (BACTROBAN ) 2 % ointment, Apply topically twice daily as needed to affected area, Disp: 15 g, Rfl: 5    omeprazole  (PRILOSEC ) 20 MG capsule, Take 1 capsule (20 mg total) by mouth daily., Disp: 90 capsule, Rfl: 3    oxyCODONE  (ROXICODONE ) 5 MG immediate release tablet, Take 1 tablet (5 mg total) by mouth every eight (8) hours as needed for pain., Disp: 70 tablet, Rfl: 0    [START ON 12/08/2024] oxyCODONE  (ROXICODONE ) 5 MG immediate release tablet, Take 1 tablet (5 mg total) by mouth every eight (8) hours as needed for pain., Disp: 70 tablet, Rfl: 0    [START ON 02/05/2025] oxyCODONE  (ROXICODONE ) 5 MG immediate release tablet, Take 1 tablet (5 mg total) by mouth every eight (8) hours as needed for pain., Disp: 70 tablet, Rfl: 0    [START ON 01/07/2025] oxyCODONE  (ROXICODONE ) 5 MG immediate release tablet, Take 1 tablet (5 mg total) by mouth every eight (8) hours as needed for pain., Disp: 70 tablet, Rfl: 0    pen needle, diabetic (BD ULTRA-FINE NANO PEN NEEDLE) 32 gauge x 5/32 (4 mm) Ndle, ok to sub any brand or size needle preferred by insurance/patient, use 1x/day for Teriparatide  injections, Disp: 100 each, Rfl: 3    polyethylene glycol (CLEARLAX) 17 gram/dose powder, Take as directed for extended bowel prep., Disp: 238 g, Rfl: 0    potassium chloride  20 MEQ ER tablet, Take 1 tablet (20 mEq total) by mouth two (2) times a day., Disp: 60 tablet, Rfl: 11    pramipexole  (MIRAPEX ) 0.125 MG tablet, Take 1 tablet by mouth nightly, Disp: 90 tablet, Rfl: 0    rifAXIMin  (XIFAXAN ) 550 mg Tab, Take 1 tablet (550 mg total) by mouth two (2) times a day., Disp: 180 tablet, Rfl: 3    spironolactone  (ALDACTONE ) 100 MG tablet, Take 1 tablet (100 mg total) by mouth daily., Disp: 90 tablet, Rfl: 3    teriparatide  (FORTEO ) 20 mcg/dose (570mcg/2.24mL) injection, Inject 0.08 mL (20 mcg total) under the skin daily., Disp: 2.24 mL, Rfl: 11    triamcinolone  (KENALOG ) 0.1 % cream, Apply topically two (2) times a day as needed. until smooth, Disp: 80 g, Rfl: 3    ursodiol  (URSO  FORTE) 500 MG tablet, Take 1 tablet (500 mg total) by mouth two (2) times a day., Disp: 60 tablet, Rfl: 11    VENTOLIN  HFA 90 mcg/actuation inhaler, Inhale 2 puffs every six (6) hours as needed for wheezing or shortness of breath., Disp: 18 g, Rfl: 5    vitamin A-2,400 mcg RAE, 8,000 units, 2,400 mcg RAE (8,000 units) capsule, Take 6 capsules (14,400 mcg of RAE total) by mouth daily for 28 days, THEN 2 capsules (4,800 mcg of RAE total) daily., Disp: , Rfl:       Review of Systems  A 12 point review of systems was otherwise negative except as noted in  the HPI.      Objective:      LMP 02/02/2013   Wt Readings from Last 3 Encounters:   10/07/24 61.2 kg (135 lb)   08/31/24 66.4 kg (146 lb 6.4 oz)   07/15/24 68 kg (150 lb)      BMI Readings from Last 3 Encounters:   10/07/24 22.47 kg/m??   08/31/24 24.36 kg/m??   07/15/24 24.96 kg/m??         GEN: appears well, in NAD  HEENT: sclerae anicteric  NECK:  no visible neck mass or deformity  CHEST: normal breathing chest movements  NEURO: Aox3, following commands  PSYCH: normal affect.  SKIN: no visible rash (upper body and face)  Limited exam due to video visit      Lab Review: most recent Calcium, Cr, GFR, Alk Phos, Phosphate and vitamin levels reviewed when available  Component      Latest Ref Rng 08/31/2024   Sodium      135 - 145 mmol/L 143    Potassium      3.5 - 5.1 mmol/L 3.5    Chloride      98 - 107 mmol/L 104    CO2      20.0 - 31.0 mmol/L 28.0    Anion Gap      5 - 14 mmol/L 11    Bun      9 - 23 mg/dL 10    Creatinine      9.44 - 1.02 mg/dL 9.35    BUN/Creatinine Ratio 16    eGFR CKD-EPI (2021) Female      >=60 mL/min/1.6m2 >90    Glucose      70 - 179 mg/dL 93    Calcium      8.7 - 10.4 mg/dL 9.4          Component      Latest Ref Rng 03/31/2024   Sodium      135 - 145 mmol/L 142    Potassium      3.4 - 4.8 mmol/L 3.0 (L)    Chloride      98 - 107 mmol/L 102    CO2      20.0 - 31.0 mmol/L 24.8    Anion Gap      5 - 14 mmol/L 15 (H)    Bun      9 - 23 mg/dL 11    Creatinine      9.44 - 1.02 mg/dL 9.47 (L)    BUN/Creatinine Ratio 21    eGFR CKD-EPI (2021) Female      >=60 mL/min/1.58m2 >90    Glucose      70 - 179 mg/dL 877    Calcium      8.7 - 10.4 mg/dL 9.1    Albumin      3.4 - 5.0 g/dL 3.2 (L)    Total Protein      5.7 - 8.2 g/dL 6.2    Total Bilirubin      0.3 - 1.2 mg/dL 2.2 (H)    SGOT (AST)      <=34 U/L 33    ALT      10 - 49 U/L 25    Alkaline Phosphatase      46 - 116 U/L 141 (H)       Component      Latest Ref Rng 03/31/2024   Phosphorus      2.4 - 5.1 mg/dL 2.7    PTH  18.4 - 80.1 pg/mL 44.2        Component      Latest Ref Rng 03/25/2024   Creatinine, Urine      Undefined mg/dL 68.5    Creatinine, 24 Hour Urine      600 - 1,800 mg/24 hrs 863.5    Creatinine timed urine volume      mL 2,750    Creatinine Urine Hrs Collected      hrs 24    Calcium, Ur      Undefined mg/dL 84.3    Calcium, 24 Hour Urine      100 - 300 mg/24hr 429 (H)    Calcium timed urine volume      mL 2,750    Calcium Urine Hrs Collected      hrs 24         Radiology: most recent DXA scan reviewed and compared to previous one     EXAM: DEXA BONE DENSITY SKELETAL   DATE: 04/21/2024 12:08 PM     Lumbar Spine       Excluded levels: none.       BMD:   0.650 (g/cm2)         T score: -3.6     Lumbar WHO classification: OSTEOPOROSIS.        Left Hip         Femoral Neck:           BMD: 0.454 (g/cm2)           T score: -3.6       Total Hip           BMD: 0.567 (g/cm2)             T score: -2.1     Left Hip WHO classification: OSTEOPOROSIS.                 DXA 03/2021  LS -3.1, 0.710  TH -2.7 0.613  FN -3.0, 0.515     10/2019  LS -3.7, 0.642  TH -2.9 0.584  FN -3.0, 0.515

## 2024-11-15 DIAGNOSIS — S22060D Wedge compression fracture of T7-T8 vertebra, subsequent encounter for fracture with routine healing: Principal | ICD-10-CM

## 2024-11-15 DIAGNOSIS — M8000XG Age-related osteoporosis with current pathological fracture, unspecified site, subsequent encounter for fracture with delayed healing: Principal | ICD-10-CM

## 2024-11-15 MED ORDER — EMPTY CONTAINER
0 refills | 0.00000 days | Status: CP
Start: 2024-11-15 — End: ?

## 2024-11-16 LAB — COMPREHENSIVE METABOLIC PANEL
ALBUMIN: 3.7 g/dL — ABNORMAL LOW (ref 3.9–4.9)
ALKALINE PHOSPHATASE: 147 IU/L — ABNORMAL HIGH (ref 49–135)
ALT (SGPT): 21 IU/L (ref 0–32)
AST (SGOT): 29 IU/L (ref 0–40)
BILIRUBIN TOTAL (MG/DL) IN SER/PLAS: 2.7 mg/dL — ABNORMAL HIGH (ref 0.0–1.2)
BLOOD UREA NITROGEN: 13 mg/dL (ref 8–27)
BUN / CREAT RATIO: 21 (ref 12–28)
CALCIUM: 9.4 mg/dL (ref 8.7–10.3)
CHLORIDE: 102 mmol/L (ref 96–106)
CO2: 23 mmol/L (ref 20–29)
CREATININE: 0.63 mg/dL (ref 0.57–1.00)
EGFR: 101 mL/min/1.73
GLOBULIN, TOTAL: 2.2 g/dL (ref 1.5–4.5)
GLUCOSE: 104 mg/dL — ABNORMAL HIGH (ref 70–99)
POTASSIUM: 4 mmol/L (ref 3.5–5.2)
SODIUM: 137 mmol/L (ref 134–144)
TOTAL PROTEIN: 5.9 g/dL — ABNORMAL LOW (ref 6.0–8.5)

## 2024-11-16 LAB — IRON & TIBC
IRON SATURATION: 83 % (ref 15–55)
IRON: 180 ug/dL — ABNORMAL HIGH (ref 27–139)
TOTAL IRON BINDING CAPACITY: 217 ug/dL — ABNORMAL LOW (ref 250–450)
UNSATURATED IRON BINDING CAPACITY: 37 ug/dL — ABNORMAL LOW (ref 118–369)

## 2024-11-16 LAB — BASIC METABOLIC PANEL
BLOOD UREA NITROGEN: 14 mg/dL (ref 8–27)
BUN / CREAT RATIO: 28 (ref 12–28)
CALCIUM: 9.4 mg/dL (ref 8.7–10.3)
CHLORIDE: 101 mmol/L (ref 96–106)
CO2: 22 mmol/L (ref 20–29)
CREATININE: 0.5 mg/dL — ABNORMAL LOW (ref 0.57–1.00)
EGFR: 107 mL/min/1.73
GLUCOSE: 106 mg/dL — ABNORMAL HIGH (ref 70–99)
POTASSIUM: 4 mmol/L (ref 3.5–5.2)
SODIUM: 137 mmol/L (ref 134–144)

## 2024-11-16 LAB — VITAMIN D 25 HYDROXY: VITAMIN D 25-HYDROXY: 31.7 ng/mL (ref 30.0–100.0)

## 2024-11-16 LAB — FERRITIN: FERRITIN: 221 ng/mL — ABNORMAL HIGH (ref 15–150)

## 2024-11-16 MED FILL — PEN NEEDLE, DIABETIC 32 GAUGE X 5/32" (4 MM): ORAL | 100 days supply | Qty: 100 | Fill #1

## 2024-11-16 MED FILL — TERIPARATIDE 20 MCG/DOSE (560 MCG/2.24 ML) SUBCUTANEOUS PEN INJECTOR: SUBCUTANEOUS | 28 days supply | Qty: 2.24 | Fill #4

## 2024-11-16 MED FILL — EMPTY CONTAINER: 120 days supply | Qty: 1 | Fill #0

## 2024-11-17 LAB — VITAMIN A: VITAMIN A RESULT: 24.2 ug/dL (ref 22.0–69.5)

## 2024-11-17 LAB — ZINC: ZINC: 40 ug/dL — ABNORMAL LOW (ref 44–115)

## 2024-11-21 LAB — PHOSPHATIDYLETHANOL (PETH): PHOSPHATIDYLETHANOL (PETH): NEGATIVE ng/mL

## 2024-11-24 DIAGNOSIS — J449 Chronic obstructive pulmonary disease, unspecified: Principal | ICD-10-CM

## 2024-11-24 DIAGNOSIS — S22060D Wedge compression fracture of T7-T8 vertebra, subsequent encounter for fracture with routine healing: Principal | ICD-10-CM

## 2024-11-24 NOTE — Progress Notes (Signed)
 [facility] [place] POPULATION HEALTH  Care Management Progress Note    Date: 11/24/2024  Outcome:  Phone outreach completed    Is this patient in [place] ? Yes    Purpose of contact:           CM contacted patient for a routine CCM check in. Patient reports that she is doing good yet continues to experience her chronic back pain and takes medication for it.     Patient requested that PCP respond to MyChart message sent on 1/17 about what her recent lab results mean. CM routed request to PCP.     Provider/Care Partner(s) to follow up on:   Explanation of 1/12 lab results    Health Maintenance:  Health Maintenance Due   Topic Date Due    Zoster Vaccines (1 of 2) Never done    COVID-19 Vaccine (1 - 2025-26 season) Never done    Influenza Vaccine (1) 07/05/2024     Additional Information/Plan:  Patient provided my direct contact information and encouraged to contact me should additional needs arise.    Time Spent Per Day:  Chart review was completed prior to outreach attempt.   11/24/2024: 6    Praneeth Bussey, [employer] Fellow  [facility] Health Care Manager  [facility] [place]

## 2024-12-06 DIAGNOSIS — E509 Vitamin A deficiency, unspecified: Secondary | ICD-10-CM

## 2024-12-06 DIAGNOSIS — K743 Primary biliary cirrhosis: Principal | ICD-10-CM

## 2024-12-06 DIAGNOSIS — E60 Dietary zinc deficiency: Secondary | ICD-10-CM

## 2024-12-06 NOTE — Telephone Encounter (Signed)
Reviewed. Action taken in a different encounter.

## 2024-12-07 NOTE — Progress Notes (Signed)
 [facility] Refill Coordination Note    Specialty Medication(s) to be Shipped:   General Specialty: teriparatide     Other medication(s) to be shipped: No additional medications requested for fill at this time    Specialty Medications not needed at this time: N/A     Mary Owens, DOB: 1961/09/12  Phone: [phone] (home)       All above HIPAA information was verified with patient.     Was a nurse, learning disability used for this call? No    Completed refill call assessment today to schedule patient's medication shipment from the [facility]  [phone]).  All relevant notes have been reviewed.     Specialty medication(s) and dose(s) confirmed: Regimen is correct and unchanged.   Changes to medications: Icela reports no changes at this time.  Changes to insurance: No  New side effects reported not previously addressed with a pharmacist or physician: None reported  Questions for the pharmacist: No    Confirmed patient received a Conservation Officer, Historic Buildings and a Surveyor, Mining with first shipment. The patient will receive a drug information handout for each medication shipped and additional FDA Medication Guides as required.       DISEASE/MEDICATION-SPECIFIC INFORMATION        N/A    SPECIALTY MEDICATION ADHERENCE     Medication Adherence    Patient reported X missed doses in the last month: 0  Specialty Medication: teriparatide  20 mcg/dose (564mcg/2.24mL) injection (FORTEO )  Patient is on additional specialty medications: No  Patient is on more than two specialty medications: No  Any gaps in refill history greater than 2 weeks in the last 3 months: no  Demonstrates understanding of importance of adherence: yes              Were doses missed due to medication being on hold? No    teriparatide  20  mg/ml: 9 days of medicine on hand       Specialty medication is an injection or given on a cycle: No    REFERRAL TO PHARMACIST     Referral to the pharmacist: Not needed      [facility]     Shipping address confirmed in Epic.     Cost and Payment: Patient has a $0 copay, payment information is not required.    Delivery Scheduled: Yes, Expected medication delivery date: 12/14/24.     Medication will be delivered via UPS to the prescription address in Epic WAM.    Kyra Myron   [facility]  Specialty Technician

## 2024-12-08 MED ORDER — OXYCODONE 5 MG TABLET
ORAL_TABLET | Freq: Three times a day (TID) | ORAL | 0 refills | 24.00000 days | Status: CP | PRN
Start: 2024-12-08 — End: 2025-01-07

## 2025-01-07 MED ORDER — OXYCODONE 5 MG TABLET
ORAL_TABLET | Freq: Three times a day (TID) | ORAL | 0 refills | 24.00000 days | Status: CP | PRN
Start: 2025-01-07 — End: 2025-02-06

## 2025-02-05 MED ORDER — OXYCODONE 5 MG TABLET
ORAL_TABLET | Freq: Three times a day (TID) | ORAL | 0 refills | 24.00000 days | Status: CP | PRN
Start: 2025-02-05 — End: 2025-03-07
